# Patient Record
Sex: Female | Born: 1935 | Race: White | Hispanic: No | Marital: Married | Smoking: Never smoker
Health system: Southern US, Community
[De-identification: ages and names within clinical notes are randomized; demographics above are authoritative.]

## PROBLEM LIST (undated history)

## (undated) DIAGNOSIS — R2681 Unsteadiness on feet: Secondary | ICD-10-CM

## (undated) DIAGNOSIS — M549 Dorsalgia, unspecified: Secondary | ICD-10-CM

## (undated) DIAGNOSIS — J449 Chronic obstructive pulmonary disease, unspecified: Secondary | ICD-10-CM

## (undated) DIAGNOSIS — R251 Tremor, unspecified: Secondary | ICD-10-CM

## (undated) DIAGNOSIS — M542 Cervicalgia: Secondary | ICD-10-CM

## (undated) DIAGNOSIS — I10 Essential (primary) hypertension: Secondary | ICD-10-CM

## (undated) DIAGNOSIS — H409 Unspecified glaucoma: Secondary | ICD-10-CM

## (undated) DIAGNOSIS — G20A1 Parkinson's disease without dyskinesia, without mention of fluctuations: Secondary | ICD-10-CM

## (undated) DIAGNOSIS — M1990 Unspecified osteoarthritis, unspecified site: Secondary | ICD-10-CM

## (undated) DIAGNOSIS — F0390 Unspecified dementia without behavioral disturbance: Secondary | ICD-10-CM

## (undated) DIAGNOSIS — G20 Parkinson's disease: Secondary | ICD-10-CM

## (undated) HISTORY — PX: TUBAL LIGATION: SHX77

## (undated) HISTORY — DX: Chronic obstructive pulmonary disease, unspecified: J44.9

## (undated) HISTORY — PX: EYE SURGERY: SHX253

## (undated) HISTORY — PX: CATARACT EXTRACTION: SUR2

## (undated) HISTORY — DX: Parkinson's disease without dyskinesia, without mention of fluctuations: G20.A1

## (undated) HISTORY — DX: Parkinson's disease: G20

## (undated) HISTORY — PX: TONSILLECTOMY: SUR1361

---

## 2014-08-18 ENCOUNTER — Emergency Department (HOSPITAL_COMMUNITY): Payer: Medicare HMO

## 2014-08-18 ENCOUNTER — Encounter (HOSPITAL_COMMUNITY): Payer: Self-pay | Admitting: Emergency Medicine

## 2014-08-18 ENCOUNTER — Emergency Department (HOSPITAL_COMMUNITY)
Admission: EM | Admit: 2014-08-18 | Discharge: 2014-08-18 | Disposition: A | Discharge status: Home / Self Care | Payer: Medicare HMO | Attending: Emergency Medicine | Admitting: Emergency Medicine

## 2014-08-18 DIAGNOSIS — Z79899 Other long term (current) drug therapy: Secondary | ICD-10-CM | POA: Insufficient documentation

## 2014-08-18 DIAGNOSIS — R251 Tremor, unspecified: Secondary | ICD-10-CM | POA: Insufficient documentation

## 2014-08-18 DIAGNOSIS — Z7982 Long term (current) use of aspirin: Secondary | ICD-10-CM | POA: Diagnosis not present

## 2014-08-18 DIAGNOSIS — R0602 Shortness of breath: Secondary | ICD-10-CM | POA: Insufficient documentation

## 2014-08-18 DIAGNOSIS — I10 Essential (primary) hypertension: Secondary | ICD-10-CM | POA: Insufficient documentation

## 2014-08-18 DIAGNOSIS — M542 Cervicalgia: Secondary | ICD-10-CM | POA: Insufficient documentation

## 2014-08-18 HISTORY — DX: Essential (primary) hypertension: I10

## 2014-08-18 LAB — COMPREHENSIVE METABOLIC PANEL
ALBUMIN: 4 g/dL (ref 3.5–5.0)
ALT: 20 U/L (ref 14–54)
AST: 31 U/L (ref 15–41)
Alkaline Phosphatase: 57 U/L (ref 38–126)
Anion gap: 8 (ref 5–15)
BILIRUBIN TOTAL: 1.1 mg/dL (ref 0.3–1.2)
BUN: 14 mg/dL (ref 6–20)
CHLORIDE: 97 mmol/L — AB (ref 101–111)
CO2: 26 mmol/L (ref 22–32)
Calcium: 9.1 mg/dL (ref 8.9–10.3)
Creatinine, Ser: 0.74 mg/dL (ref 0.44–1.00)
GFR calc Af Amer: >60 mL/min (ref >60)
Glucose, Bld: 96 mg/dL (ref 70–99)
Potassium: 4.5 mmol/L (ref 3.5–5.1)
SODIUM: 131 mmol/L — AB (ref 135–145)
Total Protein: 7.7 g/dL (ref 6.5–8.1)

## 2014-08-18 LAB — CBC WITH DIFFERENTIAL/PLATELET
BASOS PCT: 1 % (ref 0–1)
Basophils Absolute: 0.1 10*3/uL (ref 0.0–0.1)
EOS ABS: 0.5 10*3/uL (ref 0.0–0.7)
Eosinophils Relative: 6 % — ABNORMAL HIGH (ref 0–5)
HEMATOCRIT: 39.5 % (ref 36.0–46.0)
Hemoglobin: 13.2 g/dL (ref 12.0–15.0)
LYMPHS ABS: 2.4 10*3/uL (ref 0.7–4.0)
Lymphocytes Relative: 25 % (ref 12–46)
MCH: 32 pg (ref 26.0–34.0)
MCHC: 33.4 g/dL (ref 30.0–36.0)
MCV: 95.6 fL (ref 78.0–100.0)
MONOS PCT: 13 % — AB (ref 3–12)
Monocytes Absolute: 1.2 10*3/uL — ABNORMAL HIGH (ref 0.1–1.0)
NEUTROS PCT: 55 % (ref 43–77)
Neutro Abs: 5.2 10*3/uL (ref 1.7–7.7)
Platelets: 259 10*3/uL (ref 150–400)
RBC: 4.13 MIL/uL (ref 3.87–5.11)
RDW: 12.6 % (ref 11.5–15.5)
WBC: 9.4 10*3/uL (ref 4.0–10.5)

## 2014-08-18 LAB — URINE MICROSCOPIC-ADD ON

## 2014-08-18 LAB — URINALYSIS, ROUTINE W REFLEX MICROSCOPIC
Bilirubin Urine: NEGATIVE
GLUCOSE, UA: NEGATIVE mg/dL
Ketones, ur: NEGATIVE mg/dL
Leukocytes, UA: NEGATIVE
Nitrite: NEGATIVE
PH: 6.5 (ref 5.0–8.0)
Protein, ur: NEGATIVE mg/dL
Specific Gravity, Urine: <1.005 — ABNORMAL LOW (ref 1.005–1.030)
Urobilinogen, UA: 0.2 mg/dL (ref 0.0–1.0)

## 2014-08-18 LAB — BRAIN NATRIURETIC PEPTIDE: B NATRIURETIC PEPTIDE 5: 104 pg/mL — AB (ref 0.0–100.0)

## 2014-08-18 LAB — TROPONIN I: Troponin I: <0.03 ng/mL (ref <0.031)

## 2014-08-18 NOTE — ED Notes (Signed)
Family reports pt has had difficulty controlling her hypertension. Recent medication changes.

## 2014-08-18 NOTE — ED Provider Notes (Signed)
CSN: [id number]     Arrival date & time 08/18/14  1103 History  This chart was scribed for Gilda Creasehristopher J Nastasia Kage, MD by Elon SpannerGarrett Cook, ED Scribe. This patient was seen in room APA07/APA07 and the patient's care was started at 11:13 AM.   Chief Complaint  Patient presents with  . Hypertension    The history is provided by the patient. No language interpreter was used.   HPI Comments: Sandra Davis is a 81 y.o. female with a history of HTN who presents to the Emergency Department complaining of of HTN onset 6 days ago. She also notes a current headache and neck pain as well as mild, intermittent SOB.   She is prescribed and compliant with her BP medication at home.  Patient denies CP.     Past Medical History  Diagnosis Date  . Hypertension    Past Surgical History  Procedure Laterality Date  . Eye surgery     Family History  Problem Relation Age of Onset  . Heart failure Mother    History  Substance Use Topics  . Smoking status: Never Smoker   . Smokeless tobacco: Never Used  . Alcohol Use: No   OB History    Gravida Para Term Preterm AB TAB SAB Ectopic Multiple Living   5 5 5             Review of Systems  Respiratory: Positive for shortness of breath.   Musculoskeletal: Positive for neck pain.  Neurological: Positive for headaches.  All other systems reviewed and are negative.     Allergies  Other  Home Medications   Prior to Admission medications   Medication Sig Start Date End Date Taking? Authorizing Provider  alendronate (FOSAMAX) 70 MG tablet Take 1 tablet by mouth once a week. Takes on Thursday. 05/11/14  Yes Historical Provider, MD  amLODipine (NORVASC) 5 MG tablet Take 1 tablet by mouth daily. 06/27/14  Yes Historical Provider, MD  aspirin EC 81 MG tablet Take 81 mg by mouth daily.   Yes Historical Provider, MD  losartan (COZAAR) 100 MG tablet Take 1 tablet by mouth daily. 08/16/14  Yes Historical Provider, MD  metoprolol succinate (TOPROL-XL) 25 MG 24 hr  tablet Take 12.5 mg by mouth daily.  07/16/14  Yes Historical Provider, MD  timolol (BETIMOL) 0.5 % ophthalmic solution Place 1 drop into both eyes 2 (two) times daily.   Yes Historical Provider, MD   BP 124/62 mmHg  Pulse 63  Temp(Src) 98.1 F (36.7 C) (Oral)  Resp 17  Ht 5' (1.524 m)  Wt 104 lb (47.174 kg)  BMI 20.31 kg/m2  SpO2 100% Physical Exam  Constitutional: She is oriented to person, place, and time. She appears well-developed and well-nourished. No distress.  HENT:  Head: Normocephalic and atraumatic.  Eyes: Conjunctivae and EOM are normal.  Neck: Neck supple. No tracheal deviation present.  Cardiovascular: Normal rate.   Pulmonary/Chest: Effort normal. No respiratory distress.  Musculoskeletal: Normal range of motion.  Neurological: She is alert and oriented to person, place, and time.  Resting tremor of both hands.   Skin: Skin is warm and dry.  Psychiatric: She has a normal mood and affect. Her behavior is normal.  Nursing note and vitals reviewed.   ED Course  Procedures (including critical care time)  DIAGNOSTIC STUDIES: Oxygen Saturation is 99% on RA, normal by my interpretation.    COORDINATION OF CARE:  11:22 AM Discussed treatment plan with patient at bedside.  Patient acknowledges and agrees  with plan.    Labs Review Labs Reviewed  CBC WITH DIFFERENTIAL/PLATELET - Abnormal; Notable for the following:    Monocytes Relative 13 (*)    Monocytes Absolute 1.2 (*)    Eosinophils Relative 6 (*)    All other components within normal limits  COMPREHENSIVE METABOLIC PANEL - Abnormal; Notable for the following:    Sodium 131 (*)    Chloride 97 (*)    All other components within normal limits  BRAIN NATRIURETIC PEPTIDE - Abnormal; Notable for the following:    B Natriuretic Peptide 104.0 (*)    All other components within normal limits  URINALYSIS, ROUTINE W REFLEX MICROSCOPIC - Abnormal; Notable for the following:    Specific Gravity, Urine <1.005 (*)     Hgb urine dipstick TRACE (*)    All other components within normal limits  TROPONIN I  URINE MICROSCOPIC-ADD ON    Imaging Review No results found.   EKG Interpretation   Date/Time:  Saturday Aug 18 2014 11:34:33 EDT Ventricular Rate:  62 PR Interval:  237 QRS Duration: 84 QT Interval:  404 QTC Calculation: 410 R Axis:   9 Text Interpretation:  Sinus rhythm Abnormal R-wave progression, early  transition Otherwise within normal limits Confirmed by Crayton Savarese  MD,  Antjuan Rothe [phone]([id number]) on 08/18/2014 12:30:28 PM      MDM   Final diagnoses:  Shortness of breath  Essential hypertension    Patient presented to the ER for evaluation of elevated blood pressure. Family noted pressure today. She will address some mild shortness of breath. Examination, however was unremarkable. Blood pressure was only moderately elevated, has improved without intervention. Workup has been entirely unremarkable. This included cardiac evaluation. EKG was normal, no evidence of ischemia or congestive heart failure. Patient was discharged, we'll continue to monitor her blood pressure, no change her medications.  I personally performed the services described in this documentation, which was scribed in my presence. The recorded information has been reviewed and is accurate.    Gilda Creasehristopher J Hawke Villalpando, MD 08/23/14 [phone]

## 2014-08-18 NOTE — Discharge Instructions (Signed)

## 2014-09-28 ENCOUNTER — Encounter (HOSPITAL_COMMUNITY): Payer: Self-pay | Admitting: Emergency Medicine

## 2014-09-28 ENCOUNTER — Emergency Department (HOSPITAL_COMMUNITY)
Admission: EM | Admit: 2014-09-28 | Discharge: 2014-09-28 | Discharge status: Against Medical Advice | Payer: Medicare HMO | Attending: Emergency Medicine | Admitting: Emergency Medicine

## 2014-09-28 DIAGNOSIS — I10 Essential (primary) hypertension: Secondary | ICD-10-CM | POA: Insufficient documentation

## 2014-09-28 HISTORY — DX: Unspecified osteoarthritis, unspecified site: M19.90

## 2014-09-28 HISTORY — DX: Unspecified glaucoma: H40.9

## 2014-09-28 HISTORY — DX: Dorsalgia, unspecified: M54.9

## 2014-09-28 NOTE — ED Notes (Addendum)
Patient c/o hypertension. Per husband patient has struggled with high blood pressure for months. Patient seen by [facility] clinician yesterday after blood pressure was 196/104. Patient was approved to take extra dose of atenolol in which she took today and blood pressure was still 186/88 per patient. Patient reports slight pain in neck. Denies any headache or neurological deficits.

## 2014-11-20 ENCOUNTER — Emergency Department (HOSPITAL_COMMUNITY)
Admission: EM | Admit: 2014-11-20 | Discharge: 2014-11-20 | Disposition: A | Discharge status: Home / Self Care | Payer: Medicare HMO | Attending: Emergency Medicine | Admitting: Emergency Medicine

## 2014-11-20 ENCOUNTER — Encounter (HOSPITAL_COMMUNITY): Payer: Self-pay | Admitting: *Deleted

## 2014-11-20 ENCOUNTER — Emergency Department (HOSPITAL_COMMUNITY): Payer: Medicare HMO

## 2014-11-20 DIAGNOSIS — E871 Hypo-osmolality and hyponatremia: Secondary | ICD-10-CM | POA: Diagnosis not present

## 2014-11-20 DIAGNOSIS — R251 Tremor, unspecified: Secondary | ICD-10-CM | POA: Insufficient documentation

## 2014-11-20 DIAGNOSIS — I10 Essential (primary) hypertension: Secondary | ICD-10-CM | POA: Insufficient documentation

## 2014-11-20 DIAGNOSIS — Z79899 Other long term (current) drug therapy: Secondary | ICD-10-CM | POA: Insufficient documentation

## 2014-11-20 DIAGNOSIS — M1990 Unspecified osteoarthritis, unspecified site: Secondary | ICD-10-CM | POA: Diagnosis not present

## 2014-11-20 DIAGNOSIS — Z8669 Personal history of other diseases of the nervous system and sense organs: Secondary | ICD-10-CM | POA: Insufficient documentation

## 2014-11-20 DIAGNOSIS — M542 Cervicalgia: Secondary | ICD-10-CM | POA: Insufficient documentation

## 2014-11-20 DIAGNOSIS — Z7982 Long term (current) use of aspirin: Secondary | ICD-10-CM | POA: Insufficient documentation

## 2014-11-20 LAB — CBC WITH DIFFERENTIAL/PLATELET
BASOS ABS: 0.1 10*3/uL (ref 0.0–0.1)
BASOS PCT: 1 % (ref 0–1)
Eosinophils Absolute: 0.5 10*3/uL (ref 0.0–0.7)
Eosinophils Relative: 5 % (ref 0–5)
HEMATOCRIT: 39.2 % (ref 36.0–46.0)
Hemoglobin: 13.3 g/dL (ref 12.0–15.0)
Lymphocytes Relative: 27 % (ref 12–46)
Lymphs Abs: 2.6 10*3/uL (ref 0.7–4.0)
MCH: 31.9 pg (ref 26.0–34.0)
MCHC: 33.9 g/dL (ref 30.0–36.0)
MCV: 94 fL (ref 78.0–100.0)
Monocytes Absolute: 1.3 10*3/uL — ABNORMAL HIGH (ref 0.1–1.0)
Monocytes Relative: 13 % — ABNORMAL HIGH (ref 3–12)
NEUTROS ABS: 5.2 10*3/uL (ref 1.7–7.7)
Neutrophils Relative %: 54 % (ref 43–77)
Platelets: 283 10*3/uL (ref 150–400)
RBC: 4.17 MIL/uL (ref 3.87–5.11)
RDW: 12.2 % (ref 11.5–15.5)
WBC: 9.7 10*3/uL (ref 4.0–10.5)

## 2014-11-20 LAB — URINE MICROSCOPIC-ADD ON

## 2014-11-20 LAB — URINALYSIS, ROUTINE W REFLEX MICROSCOPIC
Bilirubin Urine: NEGATIVE
GLUCOSE, UA: NEGATIVE mg/dL
KETONES UR: NEGATIVE mg/dL
Leukocytes, UA: NEGATIVE
Nitrite: NEGATIVE
Protein, ur: NEGATIVE mg/dL
Specific Gravity, Urine: 1.01 (ref 1.005–1.030)
Urobilinogen, UA: 0.2 mg/dL (ref 0.0–1.0)
pH: 7 (ref 5.0–8.0)

## 2014-11-20 LAB — BASIC METABOLIC PANEL
Anion gap: 8 (ref 5–15)
BUN: 14 mg/dL (ref 6–20)
CO2: 26 mmol/L (ref 22–32)
CREATININE: 0.66 mg/dL (ref 0.44–1.00)
Calcium: 8.8 mg/dL — ABNORMAL LOW (ref 8.9–10.3)
Chloride: 94 mmol/L — ABNORMAL LOW (ref 101–111)
GLUCOSE: 94 mg/dL (ref 65–99)
Potassium: 4.1 mmol/L (ref 3.5–5.1)
Sodium: 128 mmol/L — ABNORMAL LOW (ref 135–145)

## 2014-11-20 NOTE — ED Notes (Signed)
Patient assisted to use bed pan. Husband at bedside.

## 2014-11-20 NOTE — ED Notes (Signed)
MD at bedside. 

## 2014-11-20 NOTE — ED Notes (Addendum)
Pt states she has been feeling weak today and has been having pain in her lower neck/upper back. Pt states her blood pressure has been high today as well. Pt states she has had chills and has been shaking more than normal today.

## 2014-11-20 NOTE — ED Provider Notes (Signed)
CSN: [id number]     Arrival date & time 11/20/14  1959 History  This chart was scribed for Linwood Dibbles, MD by Budd Palmer, ED Scribe. This patient was seen in room APA06/APA06 and the patient's care was started at 9:10 PM.    Chief Complaint  Patient presents with  . Hypertension   Patient is a 81 y.o. female presenting with hypertension. The history is provided by the patient and a relative. No language interpreter was used.  Hypertension This is a recurrent problem. The current episode started 6 to 12 hours ago. The problem has been gradually improving. Pertinent negatives include no chest pain and no shortness of breath. The symptoms are relieved by medications.   HPI Comments: Sandra Davis is a 81 y.o. female with a PMHx of HTN who presents to the Emergency Department complaining of hypertension onset earlier today. She reports associated back pain.   Per relative her BP was very high (192/90-something). She has been seen by the Urgent Care clinic earlier today because she was shaking and weak. Her BP at the Urgent Care clinic was 202/100-something. She was on 50mg  of timolol, but her cardiologist reduced her to 25mg , which she took this morning and again this afternoon. She received 50 mg at Urgent Care today. She is also on losartan, which she has taken today. She states she is feeling better now. Per her relative she has not had nausea, vomiting, difficulty urinating, cough, fever, chills, CP, and SOB.  Past Medical History  Diagnosis Date  . Hypertension   . Glaucoma   . Back pain   . Arthritis    Past Surgical History  Procedure Laterality Date  . Eye surgery     Family History  Problem Relation Age of Onset  . Heart failure Mother    History  Substance Use Topics  . Smoking status: Never Smoker   . Smokeless tobacco: Never Used  . Alcohol Use: No   OB History    Gravida Para Term Preterm AB TAB SAB Ectopic Multiple Living   5 5 5             Review of Systems   Constitutional: Negative for fever and chills.  Respiratory: Negative for cough and shortness of breath.   Cardiovascular: Negative for chest pain.  Gastrointestinal: Negative for nausea and vomiting.  Genitourinary: Negative for difficulty urinating.  All other systems reviewed and are negative.   Allergies  Other  Home Medications   Prior to Admission medications   Medication Sig Start Date End Date Taking? Authorizing Provider  alendronate (FOSAMAX) 70 MG tablet Take 1 tablet by mouth once a week. Takes on Thursday. 05/11/14  Yes Historical Provider, MD  amLODipine (NORVASC) 5 MG tablet Take 5 mg by mouth daily.  06/27/14  Yes Historical Provider, MD  aspirin EC 325 MG tablet Take 325 mg by mouth daily.   Yes Historical Provider, MD  atenolol (TENORMIN) 25 MG tablet Take 25 mg by mouth 2 (two) times daily.   Yes Historical Provider, MD  Cholecalciferol (VITAMIN D3) 5000 UNITS CAPS Take 1 capsule by mouth daily.   Yes Historical Provider, MD  Glucosamine-MSM-Hyaluronic Acd (JOINT HEALTH PO) Take 1 capsule by mouth 2 (two) times daily. TRI-MOTION JOINT HEALTH   Yes Historical Provider, MD  Multiple Vitamins-Minerals (HAIR/SKIN/NAILS PO) Take 1 tablet by mouth daily.   Yes Historical Provider, MD  Omega-3 Fatty Acids (FISH OIL PO) Take 1 capsule by mouth daily.   Yes Historical Provider, MD  Specialty Vitamins Products (ECHINACEA C COMPLETE PO) Take 1 capsule by mouth daily.   Yes Historical Provider, MD  timolol (BETIMOL) 0.5 % ophthalmic solution Place 1 drop into both eyes 2 (two) times daily.   Yes Historical Provider, MD  valsartan (DIOVAN) 320 MG tablet Take 320 mg by mouth daily.   Yes Historical Provider, MD  vitamin C (ASCORBIC ACID) 500 MG tablet Take 500 mg by mouth daily.   Yes Historical Provider, MD  VITAMIN E PO Take 1 tablet by mouth daily.   Yes Historical Provider, MD   BP 144/81 mmHg  Pulse 55  Temp(Src) 98.2 F (36.8 C) (Oral)  Resp 19  Ht 5' (1.524 m)  Wt 100  lb 3.2 oz (45.45 kg)  BMI 19.57 kg/m2  SpO2 96% Physical Exam  Constitutional: No distress.  Elderly, frail  HENT:  Head: Normocephalic and atraumatic.  Right Ear: External ear normal.  Left Ear: External ear normal.  Eyes: Conjunctivae are normal. Right eye exhibits no discharge. Left eye exhibits no discharge. No scleral icterus.  Neck: Neck supple. No tracheal deviation present.  Cardiovascular: Normal rate, regular rhythm and intact distal pulses.   Pulmonary/Chest: Effort normal and breath sounds normal. No stridor. No respiratory distress. She has no wheezes. She has no rales.  Abdominal: Soft. Bowel sounds are normal. She exhibits no distension. There is no tenderness. There is no rebound and no guarding.  Musculoskeletal: She exhibits no edema.       Cervical back: She exhibits tenderness. She exhibits no bony tenderness.  Neurological: She is alert. She has normal strength. She displays tremor. No cranial nerve deficit (no facial droop, extraocular movements intact, no slurred speech) or sensory deficit. She exhibits normal muscle tone. She displays no seizure activity. Coordination normal.  Skin: Skin is warm and dry. No rash noted.  Psychiatric: She has a normal mood and affect.  Nursing note and vitals reviewed.   ED Course  Procedures  DIAGNOSTIC STUDIES: Oxygen Saturation is 98% on RA, normal by my interpretation.    COORDINATION OF CARE: 9:18 PM - Discussed plans to order diagnostic studies. Advised to follow up with cardiologist/PCP. Pt advised of plan for treatment and pt agrees.  Labs Review Labs Reviewed  URINALYSIS, ROUTINE W REFLEX MICROSCOPIC (NOT AT [facility]) - Abnormal; Notable for the following:    Color, Urine STRAW (*)    Hgb urine dipstick TRACE (*)    All other components within normal limits  CBC WITH DIFFERENTIAL/PLATELET - Abnormal; Notable for the following:    Monocytes Relative 13 (*)    Monocytes Absolute 1.3 (*)    All other components within  normal limits  BASIC METABOLIC PANEL - Abnormal; Notable for the following:    Sodium 128 (*)    Chloride 94 (*)    Calcium 8.8 (*)    All other components within normal limits  URINE MICROSCOPIC-ADD ON - Abnormal; Notable for the following:    Squamous Epithelial / LPF FEW (*)    Bacteria, UA FEW (*)    All other components within normal limits    Imaging Review Dg Chest 2 View  11/20/2014   CLINICAL DATA:  Hypertension, chills.  EXAM: CHEST  2 VIEW  COMPARISON:  Aug 18, 2014.  FINDINGS: Stable compression deformity of T12 vertebral body is noted consistent with old fracture. No pneumothorax or significant pleural effusion is noted. Stable cardiomediastinal silhouette. No acute pulmonary disease is noted.  IMPRESSION: No active cardiopulmonary disease.   Electronically  Signed   By: Lupita Raider, M.D.   On: 11/20/2014 21:50     EKG Interpretation   Date/Time:  Tuesday November 20 2014 21:25:27 EDT Ventricular Rate:  56 PR Interval:  170 QRS Duration: 99 QT Interval:  450 QTC Calculation: 434 R Axis:   16 Text Interpretation:  Sinus rhythm Low voltage, precordial leads  Anteroseptal infarct, old No significant change since last tracing  Confirmed by Alvah Gilder  MD-J, Aaron Bostwick ([id number]) on 11/20/2014 10:49:52 PM      MDM   Final diagnoses:  Essential hypertension  Hyponatremia   The patient's blood pressure improved without any additional medications. Laboratory tests are reassuring. She does have hyponatremia but this is not significantly changed from previous values.  At this time there does not appear to be any evidence of an acute emergency medical condition and the patient appears stable for discharge with appropriate outpatient follow up.   I personally performed the services described in this documentation, which was scribed in my presence.  The recorded information has been reviewed and is accurate.   Linwood Dibbles, MD 11/20/14 [phone]

## 2014-11-20 NOTE — Discharge Instructions (Signed)

## 2014-11-30 ENCOUNTER — Emergency Department (HOSPITAL_COMMUNITY): Payer: Medicare HMO

## 2014-11-30 ENCOUNTER — Emergency Department (HOSPITAL_COMMUNITY)
Admission: EM | Admit: 2014-11-30 | Discharge: 2014-11-30 | Disposition: A | Discharge status: Home / Self Care | Payer: Medicare HMO | Attending: Emergency Medicine | Admitting: Emergency Medicine

## 2014-11-30 ENCOUNTER — Encounter (HOSPITAL_COMMUNITY): Payer: Self-pay | Admitting: Emergency Medicine

## 2014-11-30 DIAGNOSIS — I10 Essential (primary) hypertension: Secondary | ICD-10-CM | POA: Diagnosis not present

## 2014-11-30 DIAGNOSIS — Y92810 Car as the place of occurrence of the external cause: Secondary | ICD-10-CM | POA: Diagnosis not present

## 2014-11-30 DIAGNOSIS — Z8669 Personal history of other diseases of the nervous system and sense organs: Secondary | ICD-10-CM | POA: Diagnosis not present

## 2014-11-30 DIAGNOSIS — S8012XA Contusion of left lower leg, initial encounter: Secondary | ICD-10-CM

## 2014-11-30 DIAGNOSIS — Y998 Other external cause status: Secondary | ICD-10-CM | POA: Diagnosis not present

## 2014-11-30 DIAGNOSIS — Z7982 Long term (current) use of aspirin: Secondary | ICD-10-CM | POA: Insufficient documentation

## 2014-11-30 DIAGNOSIS — Z79899 Other long term (current) drug therapy: Secondary | ICD-10-CM | POA: Insufficient documentation

## 2014-11-30 DIAGNOSIS — W228XXA Striking against or struck by other objects, initial encounter: Secondary | ICD-10-CM | POA: Insufficient documentation

## 2014-11-30 DIAGNOSIS — M1990 Unspecified osteoarthritis, unspecified site: Secondary | ICD-10-CM | POA: Diagnosis not present

## 2014-11-30 DIAGNOSIS — Y9389 Activity, other specified: Secondary | ICD-10-CM | POA: Insufficient documentation

## 2014-11-30 DIAGNOSIS — S8992XA Unspecified injury of left lower leg, initial encounter: Secondary | ICD-10-CM | POA: Diagnosis present

## 2014-11-30 DIAGNOSIS — T148XXA Other injury of unspecified body region, initial encounter: Secondary | ICD-10-CM

## 2014-11-30 NOTE — ED Notes (Signed)
Pt reports fell going into her home Saturday. Pt reports left leg pain ever since. Moderate contusion and swelling noted to LLE. Pt unsure if loc or hit head. Pt reports "my body moved so fast."

## 2014-11-30 NOTE — ED Provider Notes (Signed)
CSN: [id number]     Arrival date & time 11/30/14  1105 History  This chart was scribe for Glynn Octave, MD by Angelene Giovanni, ED Scribe. The patient was seen in room APA05/APA05 and the patient's care was started at 11:31 AM.     Chief Complaint  Patient presents with  . Fall   The history is provided by the patient. No language interpreter was used.   HPI Comments: Sandra Davis is a 81 y.o. female who presents to the Emergency Department status post injury that occurred 1 week ago as she was trying to get into the car. Family member reports that she hit her left leg unto the edge of the car as she was trying to get in. She denies falling to the ground or hitting her head. She reports associated hematoma on her left leg. She reports that she is currently on Aspirin. She denies any back pain or any gait problems. She adds that she always uses her walking cane. She was seen at the clinic 4 days ago and had an x-ray with no broken bones. She reports that she is here because the swelling has gotten worse.   Past Medical History  Diagnosis Date  . Hypertension   . Glaucoma   . Back pain   . Arthritis    Past Surgical History  Procedure Laterality Date  . Eye surgery     Family History  Problem Relation Age of Onset  . Heart failure Mother    Social History  Substance Use Topics  . Smoking status: Never Smoker   . Smokeless tobacco: Never Used  . Alcohol Use: No   OB History    Gravida Para Term Preterm AB TAB SAB Ectopic Multiple Living   Review of Systems  Constitutional: Negative for fever and chills.  Cardiovascular: Negative for chest pain.  Gastrointestinal: Negative for abdominal pain.  Musculoskeletal: Positive for joint swelling. Negative for back pain and gait problem.  Skin: Positive for wound.  All other systems reviewed and are negative.     Allergies  Other  Home Medications   Prior to Admission medications   Medication Sig Start  Date End Date Taking? Authorizing Provider  alendronate (FOSAMAX) 70 MG tablet Take 1 tablet by mouth once a week. Takes on Thursday. 05/11/14  Yes Historical Provider, MD  amLODipine (NORVASC) 5 MG tablet Take 5 mg by mouth daily.  06/27/14  Yes Historical Provider, MD  aspirin EC 325 MG tablet Take 325 mg by mouth daily.   Yes Historical Provider, MD  atenolol (TENORMIN) 25 MG tablet Take 50 mg by mouth 2 (two) times daily.    Yes Historical Provider, MD  Cholecalciferol (VITAMIN D3) 5000 UNITS CAPS Take 1 capsule by mouth daily.   Yes Historical Provider, MD  Glucosamine-MSM-Hyaluronic Acd (JOINT HEALTH PO) Take 1 capsule by mouth 2 (two) times daily. TRI-MOTION JOINT HEALTH   Yes Historical Provider, MD  Multiple Vitamins-Minerals (HAIR/SKIN/NAILS PO) Take 1 tablet by mouth daily.   Yes Historical Provider, MD  Omega-3 Fatty Acids (FISH OIL PO) Take 1 capsule by mouth daily.   Yes Historical Provider, MD  omeprazole (PRILOSEC) 20 MG capsule Take 20 mg by mouth daily.   Yes Historical Provider, MD  Specialty Vitamins Products (ECHINACEA C COMPLETE PO) Take 1 capsule by mouth daily.   Yes Historical Provider, MD  timolol (BETIMOL) 0.5 % ophthalmic solution Place 1 drop  into both eyes 2 (two) times daily.   Yes Historical Provider, MD  valsartan (DIOVAN) 320 MG tablet Take 320 mg by mouth daily.   Yes Historical Provider, MD  vitamin C (ASCORBIC ACID) 500 MG tablet Take 500 mg by mouth daily.   Yes Historical Provider, MD  VITAMIN E PO Take 1 tablet by mouth daily.   Yes Historical Provider, MD   BP 180/78 mmHg  Pulse 60  Temp(Src) 98 F (36.7 C) (Oral)  Resp 18  Ht 5' (1.524 m)  Wt 99 lb (44.906 kg)  BMI 19.33 kg/m2  SpO2 98% Physical Exam  Constitutional: She is oriented to person, place, and time. She appears well-developed and well-nourished. No distress.  HENT:  Head: Normocephalic and atraumatic.  Mouth/Throat: Oropharynx is clear and moist. No oropharyngeal exudate.  Eyes:  Conjunctivae and EOM are normal. Pupils are equal, round, and reactive to light.  Neck: Normal range of motion. Neck supple.  No meningismus.  Cardiovascular: Normal rate, regular rhythm, normal heart sounds and intact distal pulses.   No murmur heard. Pulmonary/Chest: Effort normal and breath sounds normal. No respiratory distress.  Abdominal: Soft. There is no tenderness. There is no rebound and no guarding.  Musculoskeletal: Normal range of motion. She exhibits edema and tenderness.  Intact DP.PT pulses.  Compartments soft  Neurological: She is alert and oriented to person, place, and time. No cranial nerve deficit. She exhibits normal muscle tone. Coordination normal.  No ataxia on finger to nose bilaterally. No pronator drift. 5/5 strength throughout. CN 2-12 intact. Equal grip strength. Sensation intact.   Skin: Skin is warm. There is erythema.  3 cm tender Hematoma and ecchymosis to L lateral lower leg. Diffuse mild swelling of ankle and dorsal foot ecchymosis to bases of 2nd and 3rd toes  Psychiatric: She has a normal mood and affect. Her behavior is normal.  Nursing note and vitals reviewed.   ED Course  Procedures (including critical care time) DIAGNOSTIC STUDIES: Oxygen Saturation is 99% on RA, normal by my interpretation.    COORDINATION OF CARE: 11:40 AM- Pt advised of plan for treatment and pt agrees.    Labs Review Labs Reviewed - No data to display  Imaging Review Dg Tibia/fibula Left  11/30/2014   CLINICAL DATA:  Pain and bruising since Saturday morning.  EXAM: LEFT FOOT - COMPLETE 3+ VIEW; LEFT ANKLE COMPLETE - 3+ VIEW; LEFT TIBIA AND FIBULA - 2 VIEW  COMPARISON:  None.  FINDINGS: Left foot:  The joint spaces are maintained. No acute fracture is identified. Mild pes cavus deformity. Small calcaneal spurs.  Left ankle:  The ankle mortise is maintained. No acute ankle fracture. No osteochondral abnormality. No joint effusion. Subcutaneous soft tissue swelling/ edema  is noted.  Left tibia/fibula:  The knee and ankle joints are maintained. No acute bony abnormality involving the tibia or fibula. Subcutaneous soft tissue swelling/ edema is noted with a probable hematoma in the lower anterior shin region.  IMPRESSION: No acute bony findings.  Subcutaneous soft tissue swelling/ edema and possible focal hematoma in the lower anterior shin region.   Electronically Signed   By: Rudie Meyer M.D.   On: 11/30/2014 12:52   Dg Ankle Complete Left  11/30/2014   CLINICAL DATA:  Pain and bruising since Saturday morning.  EXAM: LEFT FOOT - COMPLETE 3+ VIEW; LEFT ANKLE COMPLETE - 3+ VIEW; LEFT TIBIA AND FIBULA - 2 VIEW  COMPARISON:  None.  FINDINGS: Left foot:  The joint spaces are maintained.  No acute fracture is identified. Mild pes cavus deformity. Small calcaneal spurs.  Left ankle:  The ankle mortise is maintained. No acute ankle fracture. No osteochondral abnormality. No joint effusion. Subcutaneous soft tissue swelling/ edema is noted.  Left tibia/fibula:  The knee and ankle joints are maintained. No acute bony abnormality involving the tibia or fibula. Subcutaneous soft tissue swelling/ edema is noted with a probable hematoma in the lower anterior shin region.  IMPRESSION: No acute bony findings.  Subcutaneous soft tissue swelling/ edema and possible focal hematoma in the lower anterior shin region.   Electronically Signed   By: Rudie Meyer M.D.   On: 11/30/2014 12:52   US Venous Img Lower Unilateral Left  11/30/2014   CLINICAL DATA:  Left lower extremity pain and swelling after injury  EXAM: LEFT LOWER EXTREMITY VENOUS DUPLEX ULTRASOUND  TECHNIQUE: Doppler venous assessment of the left lower extremity deep venous system was performed, including characterization of spectral flow, compressibility, and phasicity.  COMPARISON:  None.  FINDINGS: There is complete compressibility of the left common femoral, femoral, and popliteal veins. Doppler analysis demonstrates respiratory  phasicity and augmentation of flow upon calf compression.  There is a complex fluid collection in the anterior shin at the location of the patient's injury most  IMPRESSION: No evidence of DVT.  Complex fluid collection in the anterior shin at the location of the patient's injury most consistent with a soft tissue hematoma. The finding is nonspecific.   Electronically Signed   By: Jolaine Click M.D.   On: 11/30/2014 13:22   Dg Foot Complete Left  11/30/2014   CLINICAL DATA:  Pain and bruising since Saturday morning.  EXAM: LEFT FOOT - COMPLETE 3+ VIEW; LEFT ANKLE COMPLETE - 3+ VIEW; LEFT TIBIA AND FIBULA - 2 VIEW  COMPARISON:  None.  FINDINGS: Left foot:  The joint spaces are maintained. No acute fracture is identified. Mild pes cavus deformity. Small calcaneal spurs.  Left ankle:  The ankle mortise is maintained. No acute ankle fracture. No osteochondral abnormality. No joint effusion. Subcutaneous soft tissue swelling/ edema is noted.  Left tibia/fibula:  The knee and ankle joints are maintained. No acute bony abnormality involving the tibia or fibula. Subcutaneous soft tissue swelling/ edema is noted with a probable hematoma in the lower anterior shin region.  IMPRESSION: No acute bony findings.  Subcutaneous soft tissue swelling/ edema and possible focal hematoma in the lower anterior shin region.   Electronically Signed   By: Rudie Meyer M.D.   On: 11/30/2014 12:52   I have personally reviewed and evaluated these images and lab results as part of my medical decision-making.   EKG Interpretation None      MDM   Final diagnoses:  Hematoma  Contusion of leg, left, initial encounter   Left leg pain and swelling while trying to get in the car 6 days ago. Did not fall to the ground. Did not hit head. Erythema and swelling to left leg with intact distal pulses.  X-rays negative. Doppler negative for DVT. There is a soft tissue mass compatible hematoma. No evidence of compartment syndrome.    Patient reassured. Advised elevation, anti- inflammatory's, supportive care and follow-up with PCP. Return precautions discussed.   I personally performed the services described in this documentation, which was scribed in my presence. The recorded information has been reviewed and is accurate.   Glynn Octave, MD 11/30/14 1356

## 2014-11-30 NOTE — Discharge Instructions (Signed)
Contusion Use ibuprofen as needed for pain and swelling. Keep leg elevated. Return to the ED if you develop new or worsening symptoms. A contusion is a deep bruise. Contusions are the result of an injury that caused bleeding under the skin. The contusion may turn blue, purple, or yellow. Minor injuries will give you a painless contusion, but more severe contusions may stay painful and swollen for a few weeks.  CAUSES  A contusion is usually caused by a blow, trauma, or direct force to an area of the body. SYMPTOMS   Swelling and redness of the injured area.  Bruising of the injured area.  Tenderness and soreness of the injured area.  Pain. DIAGNOSIS  The diagnosis can be made by taking a history and physical exam. An X-ray, CT scan, or MRI may be needed to determine if there were any associated injuries, such as fractures. TREATMENT  Specific treatment will depend on what area of the body was injured. In general, the best treatment for a contusion is resting, icing, elevating, and applying cold compresses to the injured area. Over-the-counter medicines may also be recommended for pain control. Ask your caregiver what the best treatment is for your contusion. HOME CARE INSTRUCTIONS   Put ice on the injured area.  Put ice in a plastic bag.  Place a towel between your skin and the bag.  Leave the ice on for 15-20 minutes, 3-4 times a day, or as directed by your health care provider.  Only take over-the-counter or prescription medicines for pain, discomfort, or fever as directed by your caregiver. Your caregiver may recommend avoiding anti-inflammatory medicines (aspirin, ibuprofen, and naproxen) for 48 hours because these medicines may increase bruising.  Rest the injured area.  If possible, elevate the injured area to reduce swelling. SEEK IMMEDIATE MEDICAL CARE IF:   You have increased bruising or swelling.  You have pain that is getting worse.  Your swelling or pain is not  relieved with medicines. MAKE SURE YOU:   Understand these instructions.  Will watch your condition.  Will get help right away if you are not doing well or get worse. Document Released: 01/07/2005 Document Revised: 04/04/2013 Document Reviewed: 02/02/2011 [facility] Patient Information 2015 [place], [place]. This information is not intended to replace advice given to you by your health care provider. Make sure you discuss any questions you have with your health care provider.

## 2015-02-11 ENCOUNTER — Encounter: Payer: Self-pay | Admitting: *Deleted

## 2015-02-12 ENCOUNTER — Encounter: Payer: Self-pay | Admitting: Diagnostic Neuroimaging

## 2015-02-12 ENCOUNTER — Ambulatory Visit (INDEPENDENT_AMBULATORY_CARE_PROVIDER_SITE_OTHER): Payer: Medicare HMO | Admitting: Diagnostic Neuroimaging

## 2015-02-12 VITALS — BP 142/70 | HR 54 | Ht 60.0 in | Wt 95.2 lb

## 2015-02-12 DIAGNOSIS — G20 Parkinson's disease: Secondary | ICD-10-CM | POA: Diagnosis not present

## 2015-02-12 MED ORDER — CARBIDOPA-LEVODOPA 25-100 MG PO TABS: 1.0000 | ORAL_TABLET | Freq: Three times a day (TID) | ORAL | Status: DC

## 2015-02-12 NOTE — Progress Notes (Signed)
[facility]  PATIENT: Sandra Davis DOB: May 19, 1928  REFERRING CLINICIAN: Burnett HISTORY FROM: patient and husband  REASON FOR VISIT: new consult    HISTORICAL  CHIEF COMPLAINT:  No chief complaint on file.   HISTORY OF PRESENT ILLNESS:   81 year old ambidextrous female with hypertension, here for evaluation of tremor. Patient reports tremor for past 1-2 months in upper extremity. Husband disagrees and states this is been going on for 1-2 years. Tremor is present mainly at rest but also with certain activities. Tremors affecting both arms. Also patient has had gradually progressive gait and balance deterioration. She's had multiple falls. Symptoms have been worsening over the past 1-2 years as well. Patient started using a cane approximately one year ago. She has a walker at home but apparently does not use it because it is stored in the basement. Patient and husband moved in to live with their son and his family in November 2015 due to more difficulties with them maintaining her activities of daily living.  Patient having some memory loss issues. Also with dizziness, incontinence and tremor.   REVIEW OF SYSTEMS: Full 14 system review of systems performed and notable only for as per history of present illness.  ALLERGIES: Allergies  Allergen Reactions  . Other Other (See Comments)    Patient states that she has taken some blood pressure medicines in the past and has experienced side effects.  Patient cannot remember the names.  States that the main reaction is dizziness.  Marland Kitchen Spironolactone Other (See Comments)    Blurred vision, sweating of feet    HOME MEDICATIONS: Outpatient Prescriptions Prior to Visit  Medication Sig Dispense Refill  . alendronate (FOSAMAX) 70 MG tablet Take 1 tablet by mouth once a week. Takes on Thursday.  1  . amLODipine (NORVASC) 5 MG tablet Take 5 mg by mouth daily.   1  . aspirin EC 325 MG tablet Take 325 mg by mouth daily.    Marland Kitchen  atenolol (TENORMIN) 25 MG tablet Take 50 mg by mouth 2 (two) times daily.     . Cholecalciferol (VITAMIN D3) 5000 UNITS CAPS Take 1 capsule by mouth daily.    . Glucosamine-MSM-Hyaluronic Acd (JOINT HEALTH PO) Take 1 capsule by mouth 2 (two) times daily. TRI-MOTION JOINT HEALTH    . Multiple Vitamins-Minerals (HAIR/SKIN/NAILS PO) Take 1 tablet by mouth daily.    . Omega-3 Fatty Acids (FISH OIL PO) Take 1 capsule by mouth daily.    . timolol (BETIMOL) 0.5 % ophthalmic solution Place 1 drop into both eyes 2 (two) times daily.    . valsartan (DIOVAN) 320 MG tablet Take 320 mg by mouth daily.    . vitamin C (ASCORBIC ACID) 500 MG tablet Take 500 mg by mouth daily.    Marland Kitchen VITAMIN E PO Take 1 tablet by mouth daily.    Marland Kitchen omeprazole (PRILOSEC) 20 MG capsule Take 20 mg by mouth daily.    Marland Kitchen Specialty Vitamins Products (ECHINACEA C COMPLETE PO) Take 1 capsule by mouth daily.     No facility-administered medications prior to visit.    PAST MEDICAL HISTORY: Past Medical History  Diagnosis Date  . Hypertension   . Glaucoma   . Back pain   . Arthritis     osteoporosis  . COPD (chronic obstructive pulmonary disease) (HCC)     PAST SURGICAL HISTORY: Past Surgical History  Procedure Laterality Date  . Eye surgery    . Cataract extraction    . Tonsillectomy    .  Tubal ligation      FAMILY HISTORY: Family History  Problem Relation Age of Onset  . Heart failure Mother     SOCIAL HISTORY:  Social History   Social History  . Marital Status: Married    Spouse Name: Normand Sloop  . Number of Children: 5  . Years of Education: 3   Occupational History  . Not on file.   Social History Main Topics  . Smoking status: Never Smoker   . Smokeless tobacco: Never Used  . Alcohol Use: No  . Drug Use: No  . Sexual Activity: Not on file   Other Topics Concern  . Not on file   Social History Narrative   Lives at home with husband, family   Caffeine use- tea 2 cups daily, decaf green tea       PHYSICAL EXAM  GENERAL EXAM/CONSTITUTIONAL: Vitals:  Filed Vitals:   02/12/15 1140  BP: 142/70  Pulse: 54  Height: 5' (1.524 m)  Weight: 95 lb 3.2 oz (43.182 kg)     Body mass index is 18.59 kg/(m^2).  Vision Screening Comments: 02/12/15 unable to stand for vision screen  Patient is in no distress; well developed, nourished and groomed; neck is supple  MASKED FACIES  CARDIOVASCULAR:  Examination of carotid arteries is normal; no carotid bruits  Regular rate and rhythm, no murmurs  Examination of peripheral vascular system by observation and palpation is normal  EYES:  Ophthalmoscopic exam of optic discs and posterior segments is normal; no papilledema or hemorrhages  MUSCULOSKELETAL:  Gait, strength, tone, movements noted in Neurologic exam below  NEUROLOGIC: MENTAL STATUS:  No flowsheet data found.  awake, alert, oriented to person, place and time ("February 12, 2015")  3/3 REGISTRATION; 2/3 RECALL; recent and remote memory [facility]  DECR attention and concentration  DECR FLUENCY; comprehension intact, naming intact,   fund of knowledge appropriate  CRANIAL NERVE:   2nd - no papilledema on fundoscopic exam  2nd, 3rd, 4th, 6th - pupils equal and reactive to light, visual fields full to confrontation, extraocular muscles intact, no nystagmus  5th - facial sensation symmetric  7th - facial strength symmetric  8th - hearing intact  9th - palate elevates symmetrically, uvula midline  11th - shoulder shrug symmetric  12th - tongue protrusion midline  SOFT HOARSE VOICE  MOTOR:   INTERMITTENT RESTING TREMOR IN BUE; MODERATE COGWHEELING AND BRADYKINESIA IN BUE AND BLE; DIFFUSE 4/5 STRENGTH  SENSORY:   normal and symmetric to light touch, pinprick, temperature, vibration  COORDINATION:   finger-nose-finger, fine finger movements --> SLOW  REFLEXES:   deep tendon reflexes TRACE and symmetric  GAIT/STATION:   BARELY ABLE TO STAND WITH 2  PERSON ASSIST; SHORT SHUFFLING STEPS; USES SINGLE POINT CANE; VERY UNSTEADY    DIAGNOSTIC DATA (LABS, IMAGING, TESTING) - I reviewed patient records, labs, notes, testing and imaging myself where available.  Lab Results  Component Value Date   WBC 9.7 11/20/2014   HGB 13.3 11/20/2014   HCT 39.2 11/20/2014   MCV 94.0 11/20/2014   PLT 283 11/20/2014      Component Value Date/Time   NA 128* 11/20/2014 2133   K 4.1 11/20/2014 2133   CL 94* 11/20/2014 2133   CO2 26 11/20/2014 2133   GLUCOSE 94 11/20/2014 2133   BUN 14 11/20/2014 2133   CREATININE 0.66 11/20/2014 2133   CALCIUM 8.8* 11/20/2014 2133   PROT 7.7 08/18/2014 1138   ALBUMIN 4.0 08/18/2014 1138   AST 31 08/18/2014 1138  ALT 20 08/18/2014 1138   ALKPHOS 57 08/18/2014 1138   BILITOT 1.1 08/18/2014 1138   GFRNONAA >60 11/20/2014 2133   GFRAA >60 11/20/2014 2133   No results found for: CHOL, HDL, LDLCALC, LDLDIRECT, TRIG, CHOLHDL No results found for: [id number] No results found for: VITAMINB12 No results found for: TSH     ASSESSMENT AND PLAN  81 y.o. year old female here with progressive resting tremor, bradykinesia, cogwheel rigidity, balance difficulty, masked facies, memory loss. Findings consistent with Parkinson's disease. Will check CT scan to rule out secondary causes. Advised patient to use home health therapy services, Rollator walker, and additional help from family. Will try empiric trial of carbidopa/levodopa for symptom management. Patient will return in 6 weeks for follow-up.  Dx:  Parkinson's disease (HCC) - Plan: CT Head Wo Contrast, Ambulatory referral to Home Health, For home use only DME 4 wheeled rolling walker with seat    PLAN:  Orders Placed This Encounter  Procedures  . For home use only DME 4 wheeled rolling walker with seat  . CT Head Wo Contrast  . Ambulatory referral to Home Health    Meds ordered this encounter  Medications  . carbidopa-levodopa (SINEMET IR) 25-100 MG tablet     Sig: Take 1 tablet by mouth 3 (three) times daily before meals.    Dispense:  90 tablet    Refill:  6    Return in about 6 weeks (around 03/26/2015).    Suanne MarkerVIKRAM R. PENUMALLI, MD 02/12/2015, 12:40 PM Certified in Neurology, Neurophysiology and Neuroimaging  [facility] [address], [address] [place], [place] [address] [phone]

## 2015-02-12 NOTE — Patient Instructions (Signed)
[facility] you [facility]. I hope we have been able to provide you high quality care today.  You may receive a patient satisfaction survey over the next few weeks. We would appreciate your feedback and comments so that we may continue to improve ourselves and the health of our patients.  - I will check CT head  - I will setup home health agency referral for physical therapy - use rollator walker - start carbidopa/levodopa 25/100 --> half tab three times per day before meals; after 2 weeks increase to 1 full tab three times per day   ~~~~~~~~~~~~~~~~~~~~~~~~~~~~~~~~~~~~~~~~~~~~~~~~~~~~~~~~~~~~~~~~~  DR. PENUMALLI'S GUIDE TO HAPPY AND HEALTHY LIVING These are some of my general health and wellness recommendations. Some of them may apply to you better than others. Please use common sense as you try these suggestions and feel free to ask me any questions.   ACTIVITY/FITNESS Mental, social, emotional and physical stimulation are very important for brain and body health. Try learning a new activity (arts, music, language, sports, games).  Keep moving your body to the best of your abilities. Will setup physical therapy to get started.    NUTRITION Eat more plants: colorful vegetables, nuts, seeds and berries.  Eat less sugar, salt, preservatives and processed foods.  Avoid toxins such as cigarettes and alcohol.  Drink water when you are thirsty. Warm water with a slice of lemon is an excellent morning drink to start the day.  Consider these websites for more information The Nutrition Source (https://www.garcia-mcintosh.com/https://www.hsph.harvard.edu/nutritionsource) Precision Nutrition (http://www.edwards.info/www.precisionnutrition.com/blog/infographics)    SLEEP Try to get at least 7-8+ hours sleep per day. Regular exercise and reduced caffeine will help you sleep better. Practice good sleep hygeine techniques. See website sleep.org for more information.   PLANNING Prepare estate planning,  living will, healthcare POA documents. Sometimes this is best planned with the help of an attorney. Theconversationproject.org and agingwithdignity.org are excellent resources.

## 2015-02-14 ENCOUNTER — Telehealth: Payer: Self-pay | Admitting: Diagnostic Neuroimaging

## 2015-02-14 NOTE — Telephone Encounter (Signed)
Sandra Davis/[facility] [phone] called to advise of medication change. Patient took one dose of carbidopa-levodopa (SINEMET IR) 25-100 MG tablet and became very dizzy and weak. Patient refuses to take.

## 2015-02-14 NOTE — Telephone Encounter (Signed)
Wait a few days, then try half tab dose and see if she can tolerate. -VRP

## 2015-02-14 NOTE — Telephone Encounter (Signed)
Left VM for Sandra HesselbachMaria informing her that Dr Marjory LiesPenumalli recommends patient wait a few days then try medication again, 1/2 tab with each dose to see how she tolerates. Advised a return call next week for update. Left this caller's name, number for questions, call back.

## 2015-02-21 NOTE — Telephone Encounter (Addendum)
Left VM for Byrd HesselbachMaria with [facility] requesting she call back with update on patient re: medication dosing. Left this caller's name, number and informed her this RN will be in office until 5 pm today, out of office tomorrow, but Dr Marjory LiesPenumalli is in office tomorrow.   8:38 am  Received call back from PorterMaria, [facility] who states patient's husband and daughter-in-law refused for patient to take Carbidopa-Levodopa even at reduced dose, due to her weakness, dizziness. She states husband said they may not have her CT scan done. Byrd HesselbachMaria discussed with husband and daughter-in-law that they should follow dr's advice. She states that she feels patient will have scan done. Gave her dates/ times of scan and patient's FU with Dr Marjory LiesPenumalli. Also stated this RN will move FU earlier if family requests. She verified patient is receiving PT, OT and RN home care.  8:58 am Spoke with husband and reminded him of patient's ct scan tomorrow. He verified time and stated he is taking her for scan. Will route this to Dr Marjory LiesPenumalli.

## 2015-02-21 NOTE — Telephone Encounter (Signed)
Ok to stay off carbidopa-levodopa and follow up with PCP re: weakness. -VRP

## 2015-02-21 NOTE — Telephone Encounter (Signed)
Left VM for Sandra HesselbachMaria informing her of Dr Penumalli's response. Also informed her this RN spoke to pt's husband who stated he will take her for CT scan tomorrow. Advised she does not need to return call unless she has further questions.

## 2015-02-22 ENCOUNTER — Ambulatory Visit
Admission: RE | Admit: 2015-02-22 | Discharge: 2015-02-22 | Disposition: A | Discharge status: Home / Self Care | Payer: Medicare HMO | Source: Ambulatory Visit | Attending: Diagnostic Neuroimaging | Admitting: Diagnostic Neuroimaging

## 2015-02-22 DIAGNOSIS — G20 Parkinson's disease: Secondary | ICD-10-CM

## 2015-03-19 ENCOUNTER — Encounter (INDEPENDENT_AMBULATORY_CARE_PROVIDER_SITE_OTHER): Payer: Self-pay

## 2015-03-19 ENCOUNTER — Encounter: Payer: Self-pay | Admitting: Diagnostic Neuroimaging

## 2015-03-19 ENCOUNTER — Ambulatory Visit (INDEPENDENT_AMBULATORY_CARE_PROVIDER_SITE_OTHER): Payer: Medicare HMO | Admitting: Diagnostic Neuroimaging

## 2015-03-19 VITALS — BP 172/79 | HR 64 | Ht 60.0 in | Wt 97.0 lb

## 2015-03-19 DIAGNOSIS — G20 Parkinson's disease: Secondary | ICD-10-CM

## 2015-03-19 NOTE — Patient Instructions (Signed)
[facility]. I hope we have been able to provide you high quality care today.  You may receive a patient satisfaction survey over the next few weeks. We would appreciate your feedback and comments so that we may continue to improve ourselves and the health of our patients.  - consider trying carbidopa/levodopa 1 more time in future (half with small snack) - use rollator walker - continue exercises

## 2015-03-19 NOTE — Progress Notes (Signed)
[facility]  PATIENT: Sandra Davis DOB: 1928/10/12  REFERRING CLINICIAN: Burnett HISTORY FROM: patient and husband  REASON FOR VISIT: follow up   HISTORICAL  CHIEF COMPLAINT:  Chief Complaint  Patient presents with  . Parkinson's disease    rm 7, husband Normand Sloop, 11/11 CT Head - review  . Follow-up    6 weeks    HISTORY OF PRESENT ILLNESS:   UPDATE 03/19/15: Patient doing better with PT, OT and rollator. tried 1 dose of carb/levo (half tab) and felt very weakn and dizzy, so she stopped it.   PRIOR HPI (02/12/15): 81 year old ambidextrous female with hypertension, here for evaluation of tremor. Patient reports tremor for past 1-2 months in upper extremity. Husband disagrees and states this is been going on for 1-2 years. Tremor is present mainly at rest but also with certain activities. Tremors affecting both arms. Also patient has had gradually progressive gait and balance deterioration. She's had multiple falls. Symptoms have been worsening over the past 1-2 years as well. Patient started using a cane approximately one year ago. She has a walker at home but apparently does not use it because it is stored in the basement. Patient and husband moved in to live with their son and his family in November 2015 due to more difficulties with them maintaining her activities of daily living. Patient having some memory loss issues. Also with dizziness, incontinence and tremor.   REVIEW OF SYSTEMS: Full 14 system review of systems performed and notable only for as per history of present illness.  ALLERGIES: Allergies  Allergen Reactions  . Other Other (See Comments)    Patient states that she has taken some blood pressure medicines in the past and has experienced side effects.  Patient cannot remember the names.  States that the main reaction is dizziness.  Marland Kitchen Spironolactone Other (See Comments)    Blurred vision, sweating of feet    HOME MEDICATIONS: Outpatient  Prescriptions Prior to Visit  Medication Sig Dispense Refill  . alendronate (FOSAMAX) 70 MG tablet Take 1 tablet by mouth once a week. Takes on Thursday.  1  . amLODipine (NORVASC) 5 MG tablet Take 5 mg by mouth daily.   1  . aspirin EC 325 MG tablet Take 325 mg by mouth daily.    Marland Kitchen atenolol (TENORMIN) 25 MG tablet Take 50 mg by mouth 2 (two) times daily.     . Cholecalciferol (VITAMIN D3) 5000 UNITS CAPS Take 1 capsule by mouth daily.    . Glucosamine-MSM-Hyaluronic Acd (JOINT HEALTH PO) Take 1 capsule by mouth 2 (two) times daily. TRI-MOTION JOINT HEALTH    . meloxicam (MOBIC) 7.5 MG tablet 7.5 mg as needed.  3  . Multiple Vitamins-Minerals (HAIR/SKIN/NAILS PO) Take 1 tablet by mouth daily.    . Omega-3 Fatty Acids (FISH OIL PO) Take 1 capsule by mouth daily.    Marland Kitchen omeprazole (PRILOSEC) 40 MG capsule 40 mg.    . timolol (BETIMOL) 0.5 % ophthalmic solution Place 1 drop into both eyes 2 (two) times daily.    . valsartan (DIOVAN) 320 MG tablet Take 320 mg by mouth daily.    . vitamin C (ASCORBIC ACID) 500 MG tablet Take 500 mg by mouth daily.    Marland Kitchen VITAMIN E PO Take 1 tablet by mouth daily.    . carbidopa-levodopa (SINEMET IR) 25-100 MG tablet Take 1 tablet by mouth 3 (three) times daily before meals. (Patient not taking: Reported on 03/19/2015) 90 tablet 6   No  facility-administered medications prior to visit.    PAST MEDICAL HISTORY: Past Medical History  Diagnosis Date  . Hypertension   . Glaucoma   . Back pain   . Arthritis     osteoporosis  . COPD (chronic obstructive pulmonary disease) (HCC)   . Parkinson's disease (HCC)     PAST SURGICAL HISTORY: Past Surgical History  Procedure Laterality Date  . Eye surgery    . Cataract extraction    . Tonsillectomy    . Tubal ligation      FAMILY HISTORY: Family History  Problem Relation Age of Onset  . Heart failure Mother     SOCIAL HISTORY:  Social History   Social History  . Marital Status: Married    Spouse Name:  Normand Sloopheodore  . Number of Children: 5  . Years of Education: 3   Occupational History  . Not on file.   Social History Main Topics  . Smoking status: Never Smoker   . Smokeless tobacco: Never Used  . Alcohol Use: No  . Drug Use: No  . Sexual Activity: Not on file   Other Topics Concern  . Not on file   Social History Narrative   Lives at home with husband, family   Caffeine use- tea 2 cups daily, decaf green tea      PHYSICAL EXAM  GENERAL EXAM/CONSTITUTIONAL: Vitals:  Filed Vitals:   03/19/15 1316  BP: 172/79  Pulse: 64  Height: 5' (1.524 m)  Weight: 97 lb (43.999 kg)   Body mass index is 18.94 kg/(m^2). No exam data present  Patient is in no distress; well developed, nourished and groomed; neck is supple  MASKED FACIES  CARDIOVASCULAR:  Examination of carotid arteries is normal; no carotid bruits  Regular rate and rhythm, no murmurs  Examination of peripheral vascular system by observation and palpation is normal  EYES:  Ophthalmoscopic exam of optic discs and posterior segments is normal; no papilledema or hemorrhages  MUSCULOSKELETAL:  Gait, strength, tone, movements noted in Neurologic exam below  NEUROLOGIC: MENTAL STATUS:  No flowsheet data found.  awake, alert, oriented to person, place and time  recent and remote memory [facility]  DECR attention and concentration  DECR FLUENCY; comprehension intact, naming intact,   fund of knowledge appropriate  CRANIAL NERVE:   2nd - no papilledema on fundoscopic exam  2nd, 3rd, 4th, 6th - pupils equal and reactive to light, visual fields full to confrontation, extraocular muscles intact, no nystagmus  5th - facial sensation symmetric  7th - facial strength symmetric  8th - hearing intact  9th - palate elevates symmetrically, uvula midline  11th - shoulder shrug symmetric  12th - tongue protrusion midline  SOFT HOARSE VOICE  MOTOR:   INTERMITTENT RESTING TREMOR IN BUE; MODERATE COGWHEELING  AND BRADYKINESIA IN BUE AND BLE; DIFFUSE 4/5 STRENGTH  SENSORY:   normal and symmetric to light touch, temperature, vibration  COORDINATION:   finger-nose-finger, fine finger movements --> SLOW  REFLEXES:   deep tendon reflexes TRACE and symmetric  GAIT/STATION:   ABLE TO STAND WITH MILD ASSIST; SHORT STEPS BUT ABLE TO WALK USING ROLLATOR; MILD FREEZING ON TURNING    DIAGNOSTIC DATA (LABS, IMAGING, TESTING) - I reviewed patient records, labs, notes, testing and imaging myself where available.  Lab Results  Component Value Date   WBC 9.7 11/20/2014   HGB 13.3 11/20/2014   HCT 39.2 11/20/2014   MCV 94.0 11/20/2014   PLT 283 11/20/2014      Component Value Date/Time  NA 128* 11/20/2014 2133   K 4.1 11/20/2014 2133   CL 94* 11/20/2014 2133   CO2 26 11/20/2014 2133   GLUCOSE 94 11/20/2014 2133   BUN 14 11/20/2014 2133   CREATININE 0.66 11/20/2014 2133   CALCIUM 8.8* 11/20/2014 2133   PROT 7.7 08/18/2014 1138   ALBUMIN 4.0 08/18/2014 1138   AST 31 08/18/2014 1138   ALT 20 08/18/2014 1138   ALKPHOS 57 08/18/2014 1138   BILITOT 1.1 08/18/2014 1138   GFRNONAA >60 11/20/2014 2133   GFRAA >60 11/20/2014 2133   No results found for: CHOL, HDL, LDLCALC, LDLDIRECT, TRIG, CHOLHDL No results found for: ZOXW9U No results found for: VITAMINB12 No results found for: TSH   02/22/15 CT head [I reviewed images myself and agree with interpretation. -VRP]  - normal    ASSESSMENT AND PLAN  81 y.o. year old female here with progressive resting tremor, bradykinesia, cogwheel rigidity, balance difficulty, masked facies, memory loss. Findings consistent with Parkinson's disease. Will check CT scan to rule out secondary causes. Advised patient to use home health therapy services, Rollator walker, and additional help from family. Will try empiric trial of carbidopa/levodopa for symptom management. Patient will return in 6 weeks for follow-up.  Dx:  Parkinson's disease (HCC)     PLAN: - caution with balance and walking; use rollator and continue exercises - may consider 1 more trial of carb/levo, this time with small snack - may consider amantadine in future  Return in about 4 months (around 07/18/2015).    Suanne Marker, MD 03/19/2015, 1:57 PM Certified in Neurology, Neurophysiology and Neuroimaging  [facility] [address] [place], [place] [address] [phone]

## 2015-03-26 ENCOUNTER — Ambulatory Visit: Payer: Medicare HMO | Admitting: Diagnostic Neuroimaging

## 2015-03-28 ENCOUNTER — Telehealth: Payer: Self-pay | Admitting: Diagnostic Neuroimaging

## 2015-03-28 NOTE — Telephone Encounter (Signed)
Lanora ManisElizabeth a Physical Therapist with [facility] is calling regarding the patient. She needs a verbal ok for 2 additional physical therapy visits for the patient. Thank you.

## 2015-03-28 NOTE — Telephone Encounter (Signed)
LVM for Lanora ManisElizabeth, [facility] and informed her patient may receive more PT as she recommends, per Dr Marjory LiesPenumalli. Left this caller's name, number in case any further questions.

## 2015-07-18 ENCOUNTER — Ambulatory Visit (INDEPENDENT_AMBULATORY_CARE_PROVIDER_SITE_OTHER): Payer: Medicare HMO | Admitting: Diagnostic Neuroimaging

## 2015-07-18 ENCOUNTER — Encounter: Payer: Self-pay | Admitting: Diagnostic Neuroimaging

## 2015-07-18 VITALS — BP 161/75 | HR 54 | Wt 97.0 lb

## 2015-07-18 DIAGNOSIS — R413 Other amnesia: Secondary | ICD-10-CM

## 2015-07-18 DIAGNOSIS — M545 Low back pain, unspecified: Secondary | ICD-10-CM

## 2015-07-18 DIAGNOSIS — G20 Parkinson's disease: Secondary | ICD-10-CM | POA: Diagnosis not present

## 2015-07-18 DIAGNOSIS — R269 Unspecified abnormalities of gait and mobility: Secondary | ICD-10-CM | POA: Diagnosis not present

## 2015-07-18 NOTE — Progress Notes (Signed)
[facility]  PATIENT: Sandra Davis DOB: March 02, 1929  REFERRING CLINICIAN: Burnett HISTORY FROM: patient and husband  REASON FOR VISIT: follow up   HISTORICAL  CHIEF COMPLAINT:  Chief Complaint  Patient presents with  . Parkinson's disease    rm 7, husband- Normand Sloop, "no new problems"  . Follow-up    4 month    HISTORY OF PRESENT ILLNESS:   UPDATE 07/18/15: Since last visit, pt did not try carb/levo. Having more back pain. Feeling generally weak.  UPDATE 03/19/15: Patient doing better with PT, OT and rollator. Tried 1 dose of carb/levo (half tab) and felt very weak and dizzy, so she stopped it.   PRIOR HPI (02/12/15): 82 year old ambidextrous female with hypertension, here for evaluation of tremor. Patient reports tremor for past 1-2 months in upper extremity. Husband disagrees and states this is been going on for 1-2 years. Tremor is present mainly at rest but also with certain activities. Tremors affecting both arms. Also patient has had gradually progressive gait and balance deterioration. She's had multiple falls. Symptoms have been worsening over the past 1-2 years as well. Patient started using a cane approximately one year ago. She has a walker at home but apparently does not use it because it is stored in the basement. Patient and husband moved in to live with their son and his family in November 2015 due to more difficulties with them maintaining her activities of daily living. Patient having some memory loss issues. Also with dizziness, incontinence and tremor.   REVIEW OF SYSTEMS: Full 14 system review of systems performed and notable only for as per history of present illness.  ALLERGIES: Allergies  Allergen Reactions  . Other Other (See Comments)    Patient states that she has taken some blood pressure medicines in the past and has experienced side effects.  Patient cannot remember the names.  States that the main reaction is dizziness.  Marland Kitchen  Spironolactone Other (See Comments)    Blurred vision, sweating of feet    HOME MEDICATIONS: Outpatient Prescriptions Prior to Visit  Medication Sig Dispense Refill  . alendronate (FOSAMAX) 70 MG tablet Take 1 tablet by mouth once a week. Takes on Thursday.  1  . amLODipine (NORVASC) 5 MG tablet Take 5 mg by mouth daily.   1  . aspirin EC 325 MG tablet Take 325 mg by mouth daily.    Marland Kitchen atenolol (TENORMIN) 25 MG tablet Take 50 mg by mouth 2 (two) times daily.     . budesonide-formoterol (SYMBICORT) 160-4.5 MCG/ACT inhaler INHALE 2 PUFFS INTO THE LUNGS EVERY 12 HOURS.    Marland Kitchen Cholecalciferol (VITAMIN D3) 5000 UNITS CAPS Take 1 capsule by mouth daily.    . Glucosamine-MSM-Hyaluronic Acd (JOINT HEALTH PO) Take 1 capsule by mouth 2 (two) times daily. TRI-MOTION JOINT HEALTH    . meloxicam (MOBIC) 7.5 MG tablet 7.5 mg as needed.  3  . Multiple Vitamins-Minerals (HAIR/SKIN/NAILS PO) Take 1 tablet by mouth daily.    . Omega-3 Fatty Acids (FISH OIL PO) Take 1 capsule by mouth daily.    Marland Kitchen omeprazole (PRILOSEC) 40 MG capsule 40 mg.    . timolol (BETIMOL) 0.5 % ophthalmic solution Place 1 drop into both eyes 2 (two) times daily.    . valsartan (DIOVAN) 320 MG tablet Take 320 mg by mouth daily.    . vitamin C (ASCORBIC ACID) 500 MG tablet Take 500 mg by mouth daily.    Marland Kitchen VITAMIN E PO Take 1 tablet by mouth daily.  No facility-administered medications prior to visit.    PAST MEDICAL HISTORY: Past Medical History  Diagnosis Date  . Hypertension   . Glaucoma   . Back pain   . Arthritis     osteoporosis  . COPD (chronic obstructive pulmonary disease) (HCC)   . Parkinson's disease (HCC)   . Glaucoma     PAST SURGICAL HISTORY: Past Surgical History  Procedure Laterality Date  . Eye surgery    . Cataract extraction    . Tonsillectomy    . Tubal ligation      FAMILY HISTORY: Family History  Problem Relation Age of Onset  . Heart failure Mother     SOCIAL HISTORY:  Social History    Social History  . Marital Status: Married    Spouse Name: Normand Sloopheodore  . Number of Children: 5  . Years of Education: 3   Occupational History  . Not on file.   Social History Main Topics  . Smoking status: Never Smoker   . Smokeless tobacco: Never Used  . Alcohol Use: No  . Drug Use: No  . Sexual Activity: Not on file   Other Topics Concern  . Not on file   Social History Narrative   Lives at home with husband, family   Caffeine use- tea 2 cups daily, decaf green tea      PHYSICAL EXAM  GENERAL EXAM/CONSTITUTIONAL: Vitals:  Filed Vitals:   07/18/15 1250  BP: 161/75  Pulse: 54  Weight: 97 lb (43.999 kg)   Body mass index is 18.94 kg/(m^2). No exam data present  Patient is in no distress; well developed, nourished and groomed; neck is supple  MASKED FACIES  FRAIL APPEARING  CARDIOVASCULAR:  Examination of carotid arteries is normal; no carotid bruits  Regular rate and rhythm, no murmurs  Examination of peripheral vascular system by observation and palpation is normal  EYES:  Ophthalmoscopic exam of optic discs and posterior segments is normal; no papilledema or hemorrhages  MUSCULOSKELETAL:  Gait, strength, tone, movements noted in Neurologic exam below  NEUROLOGIC: MENTAL STATUS:  No flowsheet data found.  awake, alert, oriented to person, place and time  recent and remote memory [facility]  DECR attention and concentration  DECR FLUENCY; comprehension intact, naming intact,   fund of knowledge appropriate  CRANIAL NERVE:   2nd - no papilledema on fundoscopic exam  2nd, 3rd, 4th, 6th - pupils equal and reactive to light, visual fields full to confrontation, extraocular muscles intact, no nystagmus  5th - facial sensation symmetric  7th - facial strength symmetric  8th - hearing intact  9th - palate elevates symmetrically, uvula midline  11th - shoulder shrug symmetric  12th - tongue protrusion midline  SOFT HOARSE VOICE  MOTOR:    INTERMITTENT RESTING TREMOR IN BUE; MODERATE COGWHEELING AND BRADYKINESIA IN BUE AND BLE; DIFFUSE 4/5 STRENGTH  SENSORY:   normal and symmetric to light touch, temperature, vibration  COORDINATION:   finger-nose-finger, fine finger movements --> SLOW  REFLEXES:   deep tendon reflexes TRACE and symmetric  GAIT/STATION:   ABLE TO STAND WITH MILD ASSIST; SHORT STEPS BUT ABLE TO WALK USING ROLLATOR; MILD FREEZING ON TURNING    DIAGNOSTIC DATA (LABS, IMAGING, TESTING) - I reviewed patient records, labs, notes, testing and imaging myself where available.  Lab Results  Component Value Date   WBC 9.7 11/20/2014   HGB 13.3 11/20/2014   HCT 39.2 11/20/2014   MCV 94.0 11/20/2014   PLT 283 11/20/2014  Component Value Date/Time   NA 128* 11/20/2014 2133   K 4.1 11/20/2014 2133   CL 94* 11/20/2014 2133   CO2 26 11/20/2014 2133   GLUCOSE 94 11/20/2014 2133   BUN 14 11/20/2014 2133   CREATININE 0.66 11/20/2014 2133   CALCIUM 8.8* 11/20/2014 2133   PROT 7.7 08/18/2014 1138   ALBUMIN 4.0 08/18/2014 1138   AST 31 08/18/2014 1138   ALT 20 08/18/2014 1138   ALKPHOS 57 08/18/2014 1138   BILITOT 1.1 08/18/2014 1138   GFRNONAA >60 11/20/2014 2133   GFRAA >60 11/20/2014 2133   No results found for: CHOL, HDL, LDLCALC, LDLDIRECT, TRIG, CHOLHDL No results found for: UEAV4U No results found for: VITAMINB12 No results found for: TSH   02/22/15 CT head [I reviewed images myself and agree with interpretation. -VRP]  - normal    ASSESSMENT AND PLAN  82 y.o. year old female here with progressive resting tremor, bradykinesia, cogwheel rigidity, balance difficulty, masked facies, memory loss. Findings consistent with Parkinson's disease. Will check CT scan to rule out secondary causes. Advised patient to use home health therapy services, rollator walker, and additional help from family.   Dx:  Parkinson's disease (HCC)  Gait difficulty  Bilateral low back pain without  sciatica  Memory loss    PLAN: - caution with balance and walking; use rollator and continue exercises - refer to home palliative care consult - follow up with PCP re: pain mgmt - no further parkinson's dz treatment as patient reluctant to try any other medication  Orders Placed This Encounter  Procedures  . Amb Referral to Palliative Care   Return if symptoms worsen or fail to improve, for return to PCP.    Suanne Marker, MD 07/18/2015, 1:24 PM Certified in Neurology, Neurophysiology and Neuroimaging  [facility] [address], [address] [place], [place] [address] [phone]

## 2015-07-30 DIAGNOSIS — R251 Tremor, unspecified: Secondary | ICD-10-CM | POA: Diagnosis not present

## 2015-08-21 ENCOUNTER — Emergency Department (HOSPITAL_COMMUNITY)
Admission: EM | Admit: 2015-08-21 | Discharge: 2015-08-21 | Disposition: A | Discharge status: Home / Self Care | Payer: Medicare HMO | Attending: Emergency Medicine | Admitting: Emergency Medicine

## 2015-08-21 ENCOUNTER — Encounter (HOSPITAL_COMMUNITY): Payer: Self-pay

## 2015-08-21 ENCOUNTER — Emergency Department (HOSPITAL_COMMUNITY): Payer: Medicare HMO

## 2015-08-21 DIAGNOSIS — R42 Dizziness and giddiness: Secondary | ICD-10-CM | POA: Diagnosis present

## 2015-08-21 DIAGNOSIS — R200 Anesthesia of skin: Secondary | ICD-10-CM | POA: Insufficient documentation

## 2015-08-21 DIAGNOSIS — J449 Chronic obstructive pulmonary disease, unspecified: Secondary | ICD-10-CM | POA: Insufficient documentation

## 2015-08-21 DIAGNOSIS — I10 Essential (primary) hypertension: Secondary | ICD-10-CM | POA: Insufficient documentation

## 2015-08-21 DIAGNOSIS — G20 Parkinson's disease: Secondary | ICD-10-CM | POA: Insufficient documentation

## 2015-08-21 DIAGNOSIS — W19XXXA Unspecified fall, initial encounter: Secondary | ICD-10-CM

## 2015-08-21 DIAGNOSIS — R110 Nausea: Secondary | ICD-10-CM | POA: Insufficient documentation

## 2015-08-21 DIAGNOSIS — Z79899 Other long term (current) drug therapy: Secondary | ICD-10-CM | POA: Insufficient documentation

## 2015-08-21 DIAGNOSIS — M1990 Unspecified osteoarthritis, unspecified site: Secondary | ICD-10-CM | POA: Diagnosis not present

## 2015-08-21 DIAGNOSIS — Z7982 Long term (current) use of aspirin: Secondary | ICD-10-CM | POA: Insufficient documentation

## 2015-08-21 LAB — CBC WITH DIFFERENTIAL/PLATELET
Basophils Absolute: 0.1 10*3/uL (ref 0.0–0.1)
Basophils Relative: 1 %
Eosinophils Absolute: 0.4 10*3/uL (ref 0.0–0.7)
Eosinophils Relative: 6 %
HEMATOCRIT: 40.7 % (ref 36.0–46.0)
HEMOGLOBIN: 13.6 g/dL (ref 12.0–15.0)
LYMPHS ABS: 1.5 10*3/uL (ref 0.7–4.0)
Lymphocytes Relative: 22 %
MCH: 31.6 pg (ref 26.0–34.0)
MCHC: 33.4 g/dL (ref 30.0–36.0)
MCV: 94.7 fL (ref 78.0–100.0)
MONOS PCT: 9 %
Monocytes Absolute: 0.6 10*3/uL (ref 0.1–1.0)
NEUTROS ABS: 4.3 10*3/uL (ref 1.7–7.7)
NEUTROS PCT: 62 %
Platelets: 257 10*3/uL (ref 150–400)
RBC: 4.3 MIL/uL (ref 3.87–5.11)
RDW: 12.8 % (ref 11.5–15.5)
WBC: 6.9 10*3/uL (ref 4.0–10.5)

## 2015-08-21 LAB — COMPREHENSIVE METABOLIC PANEL
ALT: 15 U/L (ref 14–54)
ANION GAP: 5 (ref 5–15)
AST: 26 U/L (ref 15–41)
Albumin: 3.9 g/dL (ref 3.5–5.0)
Alkaline Phosphatase: 49 U/L (ref 38–126)
BILIRUBIN TOTAL: 1.1 mg/dL (ref 0.3–1.2)
BUN: 15 mg/dL (ref 6–20)
CALCIUM: 9.3 mg/dL (ref 8.9–10.3)
CO2: 27 mmol/L (ref 22–32)
CREATININE: 0.94 mg/dL (ref 0.44–1.00)
Chloride: 98 mmol/L — ABNORMAL LOW (ref 101–111)
GFR calc non Af Amer: 54 mL/min — ABNORMAL LOW (ref >60)
Glucose, Bld: 109 mg/dL — ABNORMAL HIGH (ref 65–99)
Potassium: 5 mmol/L (ref 3.5–5.1)
Sodium: 130 mmol/L — ABNORMAL LOW (ref 135–145)
Total Protein: 7.7 g/dL (ref 6.5–8.1)

## 2015-08-21 LAB — TSH: TSH: 1.453 u[IU]/mL (ref 0.350–4.500)

## 2015-08-21 LAB — URINALYSIS, ROUTINE W REFLEX MICROSCOPIC
BILIRUBIN URINE: NEGATIVE
Glucose, UA: NEGATIVE mg/dL
HGB URINE DIPSTICK: NEGATIVE
Ketones, ur: NEGATIVE mg/dL
Leukocytes, UA: NEGATIVE
Nitrite: NEGATIVE
PROTEIN: NEGATIVE mg/dL
Specific Gravity, Urine: 1.01 (ref 1.005–1.030)
pH: 7 (ref 5.0–8.0)

## 2015-08-21 LAB — TROPONIN I

## 2015-08-21 MED ORDER — SODIUM CHLORIDE 0.9 % IV BOLUS (SEPSIS)
500.0000 mL | Freq: Once | INTRAVENOUS | Status: AC
  Administered 2015-08-21: 500 mL via INTRAVENOUS

## 2015-08-21 NOTE — ED Notes (Signed)
Pt's husband reports pt has fallen twice this week.  Last time was this morning on her way to the bathroom.  Pt says she is dizzy when she stands up.  Pt says after standing for a minute, the dizziness goes away.  Pt c/o pain to head.  Denies LOC.

## 2015-08-21 NOTE — ED Provider Notes (Signed)
CSN: [id number]     Arrival date & time 08/21/15  1207 History  By signing my name below, I, Sandra Davis, attest that this documentation has been prepared under the direction and in the presence of Blane OharaJoshua Zerina Hallinan, MD.   Electronically Signed: Iona Beardhristian Davis, ED Scribe. 08/21/2015. 3:05 PM   Chief Complaint  Patient presents with  . Fall  . Dizziness   The history is provided by the patient and the spouse. No language interpreter was used.   HPI Comments: Sandra Davis is a 82 y.o. female with PMHx of HTN, glaucoma, COPD, back pain, and parkinson's disease who presents to the Emergency Department complaining of gradual onset, dizziness, ongoing for about one week. Pt reports she has fallen twice this week with the most recent episode today. Pt reports associated head pain from the incident today.  No LOC noted in the incident. She also complains of numbness in her left foot, generalized weakness, and nausea. No other associated symptoms noted. No worsening or alleviating factors noted. Pt denies fevers, chills, tingling, back pain, chest pain, abdominal pain, or any other pertinent symptoms. She uses a walker for ambulation at home.   Past Medical History  Diagnosis Date  . Hypertension   . Glaucoma   . Back pain   . Arthritis     osteoporosis  . COPD (chronic obstructive pulmonary disease) (HCC)   . Parkinson's disease (HCC)   . Glaucoma    Past Surgical History  Procedure Laterality Date  . Eye surgery    . Cataract extraction    . Tonsillectomy    . Tubal ligation     Family History  Problem Relation Age of Onset  . Heart failure Mother    Social History  Substance Use Topics  . Smoking status: Never Smoker   . Smokeless tobacco: Never Used  . Alcohol Use: No   OB History    Gravida Para Term Preterm AB TAB SAB Ectopic Multiple Living   5 5 5             Review of Systems  Constitutional: Negative for fever and chills.  Cardiovascular: Negative for chest  pain.  Gastrointestinal: Positive for nausea. Negative for abdominal pain.  Musculoskeletal: Negative for back pain.  Neurological: Positive for dizziness, weakness and numbness.  All other systems reviewed and are negative.    Allergies  Other and Spironolactone  Home Medications   Prior to Admission medications   Medication Sig Start Date End Date Taking? Authorizing Provider  alendronate (FOSAMAX) 70 MG tablet Take 1 tablet by mouth once a week. Takes on Thursday. 05/11/14  Yes Historical Provider, MD  amLODipine (NORVASC) 5 MG tablet Take 5 mg by mouth daily.  06/27/14  Yes Historical Provider, MD  aspirin EC 325 MG tablet Take 325 mg by mouth daily.   Yes Historical Provider, MD  atenolol (TENORMIN) 50 MG tablet Take 50 mg by mouth 2 (two) times daily.   Yes Historical Provider, MD  budesonide-formoterol (SYMBICORT) 160-4.5 MCG/ACT inhaler INHALE 2 PUFFS INTO THE LUNGS EVERY 12 HOURS. 05/30/14  Yes Historical Provider, MD  CALCIUM-MAGNESIUM-ZINC PO Take 1 capsule by mouth daily.   Yes Historical Provider, MD  Cholecalciferol (VITAMIN D3) 5000 UNITS CAPS Take 1 capsule by mouth daily.   Yes Historical Provider, MD  GARLIC PO Take 1 capsule by mouth daily.   Yes Historical Provider, MD  Glucosamine-MSM-Hyaluronic Acd (JOINT HEALTH PO) Take 1 capsule by mouth 2 (two) times daily. TRI-MOTION JOINT HEALTH  Yes Historical Provider, MD  Multiple Vitamins-Minerals (HAIR/SKIN/NAILS PO) Take 1 tablet by mouth daily.   Yes Historical Provider, MD  Omega-3 Fatty Acids (FISH OIL PO) Take 1 capsule by mouth daily.   Yes Historical Provider, MD  omeprazole (PRILOSEC) 40 MG capsule Take 40 mg by mouth daily as needed (acid reflux).  01/11/15  Yes Historical Provider, MD  OVER THE COUNTER MEDICATION Place 1 drop into both eyes daily as needed (dry eyes).   Yes Historical Provider, MD  timolol (BETIMOL) 0.5 % ophthalmic solution Place 1 drop into both eyes 2 (two) times daily.   Yes Historical Provider,  MD  Travoprost, BAK Free, (TRAVATAN Z) 0.004 % SOLN ophthalmic solution Place 1 drop into both eyes at bedtime.  07/03/15 07/02/16 Yes Historical Provider, MD  valsartan (DIOVAN) 320 MG tablet Take 320 mg by mouth every evening.    Yes Historical Provider, MD  vitamin C (ASCORBIC ACID) 500 MG tablet Take 500 mg by mouth daily.   Yes Historical Provider, MD  VITAMIN E PO Take 1 tablet by mouth daily.   Yes Historical Provider, MD   BP 187/69 mmHg  Pulse 55  Temp(Src) 98.5 F (36.9 C) (Oral)  Resp 20  Ht 5' (1.524 m)  Wt 93 lb (42.185 kg)  BMI 18.16 kg/m2  SpO2 100% Physical Exam  Constitutional: She appears well-developed and well-nourished. No distress.  HENT:  Head: Normocephalic and atraumatic.  Eyes: Conjunctivae and EOM are normal. Pupils are equal, round, and reactive to light.  Pupils 2mm bilateral. No obvious facial droop.   Neck: Neck supple. No tracheal deviation present.  Cardiovascular: Regular rhythm and normal heart sounds.  Exam reveals no gallop.   No murmur heard. Pulmonary/Chest: Effort normal and breath sounds normal. No respiratory distress. She has no wheezes. She has no rales.  Abdominal: Soft. Bowel sounds are normal. There is no tenderness.  Musculoskeletal: Normal range of motion.  No TTP to midline cervical spine. Normal horizontal movement of head and neck. Mild tremor on right hand and arm. No arm drift. Equal strength in bilateral arms. Generally weak on examination; grossly. 5/5 strength with flexion in BLE. Sensation intact in all four extremities. No focal tenderness in thoracic or lumbar spine.   Neurological: She is alert.  Skin: Skin is warm and dry.  Psychiatric:  Flat affect  Nursing note and vitals reviewed.   ED Course  Procedures (including critical care time) DIAGNOSTIC STUDIES: Oxygen Saturation is 100% on RA, normal by my interpretation.    COORDINATION OF CARE: 12:44 PM Discussed treatment plan which includes EKG, CT cervical spine  without contrast, CT head without contrast, CBC with differential/platelet, CMP, troponin I, and urinalysis with pt at bedside and pt agreed to plan.   Labs Review Labs Reviewed  COMPREHENSIVE METABOLIC PANEL - Abnormal; Notable for the following:    Sodium 130 (*)    Chloride 98 (*)    Glucose, Bld 109 (*)    GFR calc non Af Amer 54 (*)    All other components within normal limits  CBC WITH DIFFERENTIAL/PLATELET  TROPONIN I  URINALYSIS, ROUTINE W REFLEX MICROSCOPIC (NOT AT [facility])  TSH    Imaging Review Ct Head Wo Contrast  08/21/2015  CLINICAL DATA:  Posttraumatic headache after multiple falls. Dizziness. No loss of consciousness. EXAM: CT HEAD WITHOUT CONTRAST CT CERVICAL SPINE WITHOUT CONTRAST TECHNIQUE: Multidetector CT imaging of the head and cervical spine was performed following the standard protocol without intravenous contrast. Multiplanar CT image  reconstructions of the cervical spine were also generated. COMPARISON:  None. FINDINGS: CT HEAD FINDINGS Bony calvarium appears intact. Minimal chronic ischemic white matter disease is noted. No mass effect or midline shift is noted. Ventricular size is within normal limits. There is no evidence of mass lesion, hemorrhage or acute infarction. CT CERVICAL SPINE FINDINGS No fracture or spondylolisthesis is noted. Moderate degenerative disc disease is noted at C5-6 and C6-7 with anterior osteophyte formation. Visualized lung apices are unremarkable. Degenerative changes seen involving the left posterior facet joint of C7-T1. IMPRESSION: Minimal chronic ischemic white matter disease. No acute intracranial abnormality seen. Moderate multilevel degenerative disc disease is noted. No acute abnormality seen in the cervical spine. Electronically Signed   By: Lupita Raider, M.D.   On: 08/21/2015 14:01   Ct Cervical Spine Wo Contrast  08/21/2015  CLINICAL DATA:  Posttraumatic headache after multiple falls. Dizziness. No loss of consciousness. EXAM: CT  HEAD WITHOUT CONTRAST CT CERVICAL SPINE WITHOUT CONTRAST TECHNIQUE: Multidetector CT imaging of the head and cervical spine was performed following the standard protocol without intravenous contrast. Multiplanar CT image reconstructions of the cervical spine were also generated. COMPARISON:  None. FINDINGS: CT HEAD FINDINGS Bony calvarium appears intact. Minimal chronic ischemic white matter disease is noted. No mass effect or midline shift is noted. Ventricular size is within normal limits. There is no evidence of mass lesion, hemorrhage or acute infarction. CT CERVICAL SPINE FINDINGS No fracture or spondylolisthesis is noted. Moderate degenerative disc disease is noted at C5-6 and C6-7 with anterior osteophyte formation. Visualized lung apices are unremarkable. Degenerative changes seen involving the left posterior facet joint of C7-T1. IMPRESSION: Minimal chronic ischemic white matter disease. No acute intracranial abnormality seen. Moderate multilevel degenerative disc disease is noted. No acute abnormality seen in the cervical spine. Electronically Signed   By: Lupita Raider, M.D.   On: 08/21/2015 14:01   I have personally reviewed and evaluated these images and lab results as part of my medical decision-making.   EKG Interpretation   Date/Time:  Wednesday Aug 21 2015 12:32:00 EDT Ventricular Rate:  58 PR Interval:  173 QRS Duration: 86 QT Interval:  421 QTC Calculation: 413 R Axis:   -2 Text Interpretation:  Sinus rhythm Confirmed by Jodi Mourning MD, Ivin Booty [id number]  on 08/21/2015 1:27:48 PM      MDM   Final diagnoses:  Fall, initial encounter  Dizziness   Patient presents after second fall this week. Dizziness when standing. No recent medications. Mild dehydration clinically. Plan for screening blood work, small bolus of IV fluids CT scan of the head and likely outpatient follow-up.  Pt well appearing on recheck, tolerating a meal. No acute findings on work up in ED>  Stable to fup with  her neurologist/ pcp.  Results and differential diagnosis were discussed with the patient/parent/guardian. Xrays were independently reviewed by myself.  Close follow up outpatient was discussed, comfortable with the plan.   Medications  sodium chloride 0.9 % bolus 500 mL (0 mLs Intravenous Stopped 08/21/15 1503)    Filed Vitals:   08/21/15 1300 08/21/15 1330 08/21/15 1400 08/21/15 1430  BP: 132/72 119/61 124/65 151/92  Pulse: 54 55 56 58  Temp:      TempSrc:      Resp: 16 17 16 16   Height:      Weight:      SpO2: 96% 97% 96% 97%    Final diagnoses:  Fall, initial encounter  Dizziness      Blane Ohara,  MD 08/21/15 1506

## 2015-08-21 NOTE — Discharge Instructions (Signed)
If you were given medicines take as directed.  If you are on coumadin or contraceptives realize their levels and effectiveness is altered by many different medicines.  If you have any reaction (rash, tongues swelling, other) to the medicines stop taking and see a physician.    If your blood pressure was elevated in the ER make sure you follow up for management with a primary doctor or return for chest pain, shortness of breath or stroke symptoms.  Please follow up as directed and return to the ER or see a physician for new or worsening symptoms.  Thank you. Filed Vitals:   08/21/15 1300 08/21/15 1330 08/21/15 1400 08/21/15 1430  BP: 132/72 119/61 124/65 151/92  Pulse: 54 55 56 58  Temp:      TempSrc:      Resp: 16 17 16 16   Height:      Weight:      SpO2: 96% 97% 96% 97%

## 2015-09-24 ENCOUNTER — Telehealth: Payer: Self-pay | Admitting: Diagnostic Neuroimaging

## 2015-09-24 NOTE — Telephone Encounter (Signed)
Called husband and informed him this RN would have no way of knowing where the walker was bought because this office did not provide her with a walker.  He stated he would call Home Health company and ask them, and thanked this RN for the call.

## 2015-09-24 NOTE — Telephone Encounter (Signed)
Pt's husband called sts a bolt fell off the walker. He is wanting to know where it came from so he can call to get it repaired. He gave the product # in hopes the RN would call DME supply co. Product# [id number].Please call

## 2016-03-13 ENCOUNTER — Encounter (HOSPITAL_COMMUNITY): Payer: Self-pay | Admitting: *Deleted

## 2016-03-13 ENCOUNTER — Emergency Department (HOSPITAL_COMMUNITY): Payer: Medicare HMO

## 2016-03-13 ENCOUNTER — Emergency Department (HOSPITAL_COMMUNITY)
Admission: EM | Admit: 2016-03-13 | Discharge: 2016-03-13 | Disposition: A | Discharge status: Home / Self Care | Payer: Medicare HMO | Attending: Emergency Medicine | Admitting: Emergency Medicine

## 2016-03-13 DIAGNOSIS — Z79899 Other long term (current) drug therapy: Secondary | ICD-10-CM | POA: Diagnosis not present

## 2016-03-13 DIAGNOSIS — Z7982 Long term (current) use of aspirin: Secondary | ICD-10-CM | POA: Insufficient documentation

## 2016-03-13 DIAGNOSIS — I10 Essential (primary) hypertension: Secondary | ICD-10-CM | POA: Insufficient documentation

## 2016-03-13 DIAGNOSIS — R079 Chest pain, unspecified: Secondary | ICD-10-CM | POA: Insufficient documentation

## 2016-03-13 DIAGNOSIS — R251 Tremor, unspecified: Secondary | ICD-10-CM | POA: Insufficient documentation

## 2016-03-13 DIAGNOSIS — J449 Chronic obstructive pulmonary disease, unspecified: Secondary | ICD-10-CM | POA: Insufficient documentation

## 2016-03-13 LAB — CBC
HEMATOCRIT: 40.3 % (ref 36.0–46.0)
Hemoglobin: 13.6 g/dL (ref 12.0–15.0)
MCH: 32.9 pg (ref 26.0–34.0)
MCHC: 33.7 g/dL (ref 30.0–36.0)
MCV: 97.6 fL (ref 78.0–100.0)
PLATELETS: 273 10*3/uL (ref 150–400)
RBC: 4.13 MIL/uL (ref 3.87–5.11)
RDW: 12.5 % (ref 11.5–15.5)
WBC: 8.6 10*3/uL (ref 4.0–10.5)

## 2016-03-13 LAB — URINE MICROSCOPIC-ADD ON: RBC / HPF: NONE SEEN RBC/hpf (ref 0–5)

## 2016-03-13 LAB — BASIC METABOLIC PANEL
Anion gap: 7 (ref 5–15)
BUN: 15 mg/dL (ref 6–20)
CALCIUM: 9.3 mg/dL (ref 8.9–10.3)
CO2: 28 mmol/L (ref 22–32)
CREATININE: 0.71 mg/dL (ref 0.44–1.00)
Chloride: 92 mmol/L — ABNORMAL LOW (ref 101–111)
GFR calc Af Amer: >60 mL/min (ref >60)
GLUCOSE: 131 mg/dL — AB (ref 65–99)
Potassium: 4.5 mmol/L (ref 3.5–5.1)
SODIUM: 127 mmol/L — AB (ref 135–145)

## 2016-03-13 LAB — URINALYSIS, ROUTINE W REFLEX MICROSCOPIC
BILIRUBIN URINE: NEGATIVE
Glucose, UA: NEGATIVE mg/dL
HGB URINE DIPSTICK: NEGATIVE
KETONES UR: NEGATIVE mg/dL
Nitrite: NEGATIVE
Protein, ur: NEGATIVE mg/dL
SPECIFIC GRAVITY, URINE: 1.01 (ref 1.005–1.030)
pH: 7 (ref 5.0–8.0)

## 2016-03-13 LAB — TROPONIN I

## 2016-03-13 MED ORDER — SODIUM CHLORIDE 0.9 % IV BOLUS (SEPSIS)
500.0000 mL | Freq: Once | INTRAVENOUS | Status: AC
  Administered 2016-03-13: 500 mL via INTRAVENOUS

## 2016-03-13 NOTE — ED Provider Notes (Addendum)
[facility]-EMERGENCY DEPT Provider Note   CSN: [id number] Arrival date & time: 03/13/16  1353     History   Chief Complaint Chief Complaint  Patient presents with  . Chest Pain    HPI Sandra Davis is a 82 y.o. female.  She presents for evaluation of 2 episodes of chest pain, one last night and one today. Both resolved spontaneously. She is unable to specify any associated complaints. She is a poor historian. Her husband is with her and gives most of the history. Patient states she was able to eat both breakfast and lunch today. She denies vomiting, fever, focal weakness or dizziness at this time. She has had some mild dizziness when coughing recently but does not produce sputum when coughing. She is taking her usual medications. She has a follow-up with her PCP, next week. No recent changes in medications. There are no other known modifying factors.  HPI  Past Medical History:  Diagnosis Date  . Arthritis    osteoporosis  . Back pain   . COPD (chronic obstructive pulmonary disease) (HCC)   . Glaucoma   . Glaucoma   . Hypertension   . Parkinson's disease (HCC)     There are no active problems to display for this patient.   Past Surgical History:  Procedure Laterality Date  . CATARACT EXTRACTION    . EYE SURGERY    . TONSILLECTOMY    . TUBAL LIGATION      OB History    Gravida Para Term Preterm AB Living   5 5 5          SAB TAB Ectopic Multiple Live Births                   Home Medications    Prior to Admission medications   Medication Sig Start Date End Date Taking? Authorizing Provider  albuterol (PROVENTIL HFA;VENTOLIN HFA) 108 (90 Base) MCG/ACT inhaler Inhale 1-2 puffs into the lungs every 6 (six) hours as needed for wheezing or shortness of breath.   Yes Historical Provider, MD  amLODipine (NORVASC) 5 MG tablet Take 5 mg by mouth daily.    Yes Historical Provider, MD  aspirin EC 325 MG tablet Take 325 mg by mouth daily.   Yes Historical Provider, MD    atenolol (TENORMIN) 50 MG tablet Take 50 mg by mouth daily.    Yes Historical Provider, MD  budesonide-formoterol (SYMBICORT) 160-4.5 MCG/ACT inhaler Inhale 2 puffs into the lungs 2 (two) times daily.   Yes Historical Provider, MD  calcium carbonate (OSCAL) 1500 (600 Ca) MG TABS tablet Take 600 mg of elemental calcium by mouth 2 (two) times daily with a meal.    Yes Historical Provider, MD  CALCIUM-MAGNESIUM-ZINC PO Take 1 tablet by mouth daily.    Yes Historical Provider, MD  carboxymethylcellulose (REFRESH PLUS) 0.5 % SOLN Place 1 drop into both eyes as needed (for dry eyes).   Yes Historical Provider, MD  Cholecalciferol (VITAMIN D3) 5000 UNITS CAPS Take 5,000 Units by mouth daily.    Yes Historical Provider, MD  Garlic 1000 MG CAPS Take 1,000 mg by mouth daily.   Yes Historical Provider, MD  glucosamine-chondroitin 500-400 MG tablet Take 1 tablet by mouth 2 (two) times daily.   Yes Historical Provider, MD  Multiple Vitamin (MULTIVITAMIN WITH MINERALS) TABS tablet Take 1 tablet by mouth daily.   Yes Historical Provider, MD  omega-3 acid ethyl esters (LOVAZA) 1 g capsule Take 1 g by mouth daily.   Yes  Historical Provider, MD  omeprazole (PRILOSEC) 40 MG capsule Take 40 mg by mouth daily as needed (for acid reflux).    Yes Historical Provider, MD  timolol (BETIMOL) 0.5 % ophthalmic solution Place 1 drop into both eyes 2 (two) times daily.   Yes Historical Provider, MD  valsartan (DIOVAN) 320 MG tablet Take 320 mg by mouth every evening.    Yes Historical Provider, MD  vitamin C (ASCORBIC ACID) 500 MG tablet Take 500 mg by mouth daily.   Yes Historical Provider, MD  vitamin E 400 UNIT capsule Take 400 Units by mouth daily.   Yes Historical Provider, MD    Family History Family History  Problem Relation Age of Onset  . Heart failure Mother     Social History Social History  Substance Use Topics  . Smoking status: Never Smoker  . Smokeless tobacco: Never Used  . Alcohol use No      Allergies   Hydrochlorothiazide; Lisinopril; Other; and Spironolactone   Review of Systems Review of Systems  All other systems reviewed and are negative.    Physical Exam Updated Vital Signs BP 142/83   Pulse 64   Temp 97.9 F (36.6 C) (Rectal)   Resp 18   Ht 4\' 9"  (1.448 m)   Wt 90 lb (40.8 kg)   SpO2 94%   BMI 19.48 kg/m   Physical Exam  Constitutional: She appears well-developed.  Elderly, frail  HENT:  Head: Normocephalic and atraumatic.  Eyes: Conjunctivae and EOM are normal. Pupils are equal, round, and reactive to light.  Neck: Normal range of motion and phonation normal. Neck supple.  Cardiovascular: Normal rate and regular rhythm.   Pulmonary/Chest: Effort normal and breath sounds normal. She exhibits no tenderness.  Abdominal: Soft. She exhibits no distension. There is no tenderness. There is no guarding.  Musculoskeletal: Normal range of motion.  Neurological: She is alert. She exhibits normal muscle tone. Coordination normal.  No dysarthria or aphasia. Questionable left facial droop. Normal strength and sensation in arms and legs bilaterally. Nonspecific rhythmic tremor most consistent with senile tremor.  Skin: Skin is warm and dry.  Psychiatric: She has a normal mood and affect. Her behavior is normal.  Nursing note and vitals reviewed.    ED Treatments / Results  Labs (all labs ordered are listed, but only abnormal results are displayed) Labs Reviewed  BASIC METABOLIC PANEL - Abnormal; Notable for the following:       Result Value   Sodium 127 (*)    Chloride 92 (*)    Glucose, Bld 131 (*)    All other components within normal limits  URINALYSIS, ROUTINE W REFLEX MICROSCOPIC (NOT AT [facility]) - Abnormal; Notable for the following:    Leukocytes, UA TRACE (*)    All other components within normal limits  URINE MICROSCOPIC-ADD ON - Abnormal; Notable for the following:    Squamous Epithelial / LPF 0-5 (*)    Bacteria, UA FEW (*)    All other  components within normal limits  CBC  TROPONIN I    EKG  EKG Interpretation  Date/Time:  Friday March 13 2016 14:07:13 EST Ventricular Rate:  64 PR Interval:    QRS Duration: 87 QT Interval:  393 QTC Calculation: 406 R Axis:   -10 Text Interpretation:  indeterminate rhythym Artifact since last tracing no significant change Confirmed by Effie ShyWENTZ  MD, Jihan Rudy [phone] on 03/13/2016 2:31:32 PM       Radiology Dg Chest 2 View  Result Date: 03/13/2016  CLINICAL DATA:  Chest pain into LEFT arm for couple hours, shortness of breath since last night, COPD, hypertension, Parkinson's EXAM: CHEST  2 VIEW COMPARISON:  11/20/2014 FINDINGS: Borderline enlargement of cardiac silhouette. Tortuous thoracic aorta with atherosclerotic calcification. Mediastinal contours and pulmonary vascularity normal for Mildly rotated to the RIGHT. Bronchitic emphysematous changes consistent with COPD. No pulmonary infiltrate, pleural effusion or pneumothorax. Bones demineralized. Old healed posttraumatic deformity of the proximal RIGHT humerus. IMPRESSION: COPD changes without acute infiltrate. Borderline enlargement of cardiac silhouette. Electronically Signed   By: Ulyses SouthwardMark  Boles M.D.   On: 03/13/2016 14:35    Procedures Procedures (including critical care time)  Medications Ordered in ED Medications  sodium chloride 0.9 % bolus 500 mL (0 mLs Intravenous Stopped 03/13/16 1638)     Initial Impression / Assessment and Plan / ED Course  I have reviewed the triage vital signs and the nursing notes.  Pertinent labs & imaging results that were available during my care of the patient were reviewed by me and considered in my medical decision making (see chart for details).  Clinical Course     Medications  sodium chloride 0.9 % bolus 500 mL (0 mLs Intravenous Stopped 03/13/16 1638)    Patient Vitals for the past 24 hrs:  BP Temp Temp src Pulse Resp SpO2 Height Weight  03/13/16 1647 - 97.9 F (36.6 C) Rectal - -  - - -  03/13/16 1630 142/83 - - 64 18 94 % - -  03/13/16 1628 - 98.9 F (37.2 C) - - - - - -  03/13/16 1600 140/67 - - 60 14 95 % - -  03/13/16 1530 138/65 - - 60 17 96 % - -  03/13/16 1500 (!) 125/112 - - (!) 57 16 98 % - -  03/13/16 1430 (!) 130/112 - - (!) 59 17 98 % - -  03/13/16 1405 168/73 97.6 F (36.4 C) Oral 63 - 100 % - -  03/13/16 1403 - - - - - - 4\' 9"  (1.448 m) 90 lb (40.8 kg)    5:03 PM Reevaluation with update and discussion. After initial assessment and treatment, an updated evaluation reveals Since evaluating her earlier, her hands have become reddened tip. There are no deformities, drainage or proximal streaking. Findings assess the patient and husband, all questions answered.Mancel Bale. Carmella Kees L    Final Clinical Impressions(s) / ED Diagnoses   Final diagnoses:  Nonspecific chest pain  Tremor    Evaluation of chest pain without evidence for, infarction. Pain is atypical for CAD. Doubt ACS, PE or pneumonia. Nonspecific tremor is present. This has been present for many years. Nonspecific reddening of her hands, which occurred during the evaluation in the emergency department. Doubt cellulitis, or vascular crisis. Consider vasodilation post vasoconstriction i.e., Raynaud's phenomenon.  Nursing Notes Reviewed/ Care Coordinated Applicable Imaging Reviewed Interpretation of Laboratory Data incorporated into ED treatment  The patient appears reasonably screened and/or stabilized for discharge and I doubt any other medical condition or other [facility] requiring further screening, evaluation, or treatment in the ED at this time prior to discharge.  Plan: Home Medications- continue; Home Treatments- rest; return here if the recommended treatment, does not improve the symptoms; Recommended follow up- PCP prn   New Prescriptions New Prescriptions   No medications on file     Mancel BaleElliott Rylin Seavey, MD 03/13/16 1706    Mancel BaleElliott Lulubelle Simcoe, MD 03/13/16 [id number]

## 2016-03-13 NOTE — Discharge Instructions (Signed)
Follow-up with your primary care doctor for checkup, next week.  If the chest pain returns, or is associated with symptoms such as vomiting, shortness of breath, weakness, dizziness or confusion, return here for a checkup.  The tremor which she is having is nonspecific, and does not indicate anything like a stroke.  The redness of her hands is also nonspecific, and may be related to vasodilation.

## 2016-03-13 NOTE — ED Notes (Signed)
Patient transported to X-ray 

## 2016-03-13 NOTE — ED Triage Notes (Signed)
Pt comes in with husband. Husband states that pt had chest pain last night. Pt does not presently have cp. Pt has had a non-productive cough.

## 2016-03-13 NOTE — ED Notes (Signed)
Patient drank ginger ale with no difficulty.

## 2016-03-13 NOTE — ED Notes (Signed)
Pt given ginger ale to drink. 

## 2016-05-13 ENCOUNTER — Encounter (HOSPITAL_COMMUNITY): Payer: Self-pay | Admitting: Emergency Medicine

## 2016-05-13 ENCOUNTER — Emergency Department (HOSPITAL_COMMUNITY): Payer: Medicare HMO

## 2016-05-13 ENCOUNTER — Emergency Department (HOSPITAL_COMMUNITY)
Admission: EM | Admit: 2016-05-13 | Discharge: 2016-05-13 | Disposition: A | Discharge status: Home / Self Care | Payer: Medicare HMO | Attending: Emergency Medicine | Admitting: Emergency Medicine

## 2016-05-13 DIAGNOSIS — M545 Low back pain: Secondary | ICD-10-CM | POA: Insufficient documentation

## 2016-05-13 DIAGNOSIS — M25551 Pain in right hip: Secondary | ICD-10-CM | POA: Diagnosis not present

## 2016-05-13 DIAGNOSIS — Y929 Unspecified place or not applicable: Secondary | ICD-10-CM | POA: Insufficient documentation

## 2016-05-13 DIAGNOSIS — W19XXXA Unspecified fall, initial encounter: Secondary | ICD-10-CM

## 2016-05-13 DIAGNOSIS — Z79899 Other long term (current) drug therapy: Secondary | ICD-10-CM | POA: Diagnosis not present

## 2016-05-13 DIAGNOSIS — J449 Chronic obstructive pulmonary disease, unspecified: Secondary | ICD-10-CM | POA: Insufficient documentation

## 2016-05-13 DIAGNOSIS — M542 Cervicalgia: Secondary | ICD-10-CM | POA: Insufficient documentation

## 2016-05-13 DIAGNOSIS — Y939 Activity, unspecified: Secondary | ICD-10-CM | POA: Diagnosis not present

## 2016-05-13 DIAGNOSIS — W1839XA Other fall on same level, initial encounter: Secondary | ICD-10-CM | POA: Insufficient documentation

## 2016-05-13 DIAGNOSIS — M25552 Pain in left hip: Secondary | ICD-10-CM | POA: Diagnosis not present

## 2016-05-13 DIAGNOSIS — G20 Parkinson's disease: Secondary | ICD-10-CM | POA: Diagnosis not present

## 2016-05-13 DIAGNOSIS — Y999 Unspecified external cause status: Secondary | ICD-10-CM | POA: Insufficient documentation

## 2016-05-13 DIAGNOSIS — I10 Essential (primary) hypertension: Secondary | ICD-10-CM | POA: Insufficient documentation

## 2016-05-13 DIAGNOSIS — Z7982 Long term (current) use of aspirin: Secondary | ICD-10-CM | POA: Diagnosis not present

## 2016-05-13 DIAGNOSIS — S3992XA Unspecified injury of lower back, initial encounter: Secondary | ICD-10-CM | POA: Diagnosis present

## 2016-05-13 DIAGNOSIS — M7918 Myalgia, other site: Secondary | ICD-10-CM

## 2016-05-13 HISTORY — DX: Unsteadiness on feet: R26.81

## 2016-05-13 HISTORY — DX: Tremor, unspecified: R25.1

## 2016-05-13 HISTORY — DX: Cervicalgia: M54.2

## 2016-05-13 MED ORDER — HYDROCODONE-ACETAMINOPHEN 5-325 MG PO TABS: ORAL_TABLET | ORAL | 0 refills | Status: DC

## 2016-05-13 MED ORDER — HYDROCODONE-ACETAMINOPHEN 5-325 MG PO TABS
1.0000 | ORAL_TABLET | Freq: Once | ORAL | Status: AC
Start: 2016-05-13 — End: 2016-05-13
  Administered 2016-05-13: 1 via ORAL
  Filled 2016-05-13: qty 1

## 2016-05-13 NOTE — ED Triage Notes (Signed)
Pt says she fell, not sure of the day.  C/o pain to left leg.Denies hitting head.  C/o back pain rating pain 10/10.

## 2016-05-13 NOTE — Discharge Instructions (Signed)
Take the prescription as directed.  Apply moist heat or ice to the area(s) of discomfort, for 15 minutes at a time, several times per day for the next few days.  Do not fall asleep on a heating or ice pack. Walk with your walker as tolerated until you are seen in follow up.  Call your regular medical doctor tomorrow to schedule a follow up appointment in the next 2 days.  Return to the Emergency Department immediately if worsening.

## 2016-05-13 NOTE — ED Provider Notes (Signed)
[facility]PT Provider Note   CSN: [id number] Arrival date & time: 05/13/16  1414     History   Chief Complaint Chief Complaint  Patient presents with  . Leg Injury    left  . Fall    HPI Sandra Davis is a 83 y.o. female.  HPI  Pt was seen at 1445. Per pt and her family, c/o sudden onset and resolution of one episode of fall that occurred 1.5 weeks ago. Pt states she fell onto her buttocks. Since the fall, pt has c/o low back pain, neck pain, left and right hip pain. Pt has been ambulatory since the fall. Denies new fall. Denies CP/SOB, no abd pain, no N/V/D, no focal motor weakness, no tingling/numbness in extremities.    Past Medical History:  Diagnosis Date  . Arthritis    osteoporosis  . Back pain   . COPD (chronic obstructive pulmonary disease) (HCC)   . Gait instability   . Glaucoma   . Glaucoma   . Hypertension   . Neck pain   . Parkinson's disease (HCC)   . Tremor     There are no active problems to display for this patient.   Past Surgical History:  Procedure Laterality Date  . CATARACT EXTRACTION    . EYE SURGERY    . TONSILLECTOMY    . TUBAL LIGATION      OB History    Gravida Para Term Preterm AB Living   5 5 5          SAB TAB Ectopic Multiple Live Births                   Home Medications    Prior to Admission medications   Medication Sig Start Date End Date Taking? Authorizing Provider  albuterol (PROVENTIL HFA;VENTOLIN HFA) 108 (90 Base) MCG/ACT inhaler Inhale 1-2 puffs into the lungs every 6 (six) hours as needed for wheezing or shortness of breath.   Yes Historical Provider, MD  amLODipine (NORVASC) 5 MG tablet Take 5 mg by mouth daily.    Yes Historical Provider, MD  aspirin EC 325 MG tablet Take 325 mg by mouth daily.   Yes Historical Provider, MD  atenolol (TENORMIN) 50 MG tablet Take 50 mg by mouth daily.    Yes Historical Provider, MD  budesonide-formoterol (SYMBICORT) 160-4.5 MCG/ACT inhaler Inhale 2 puffs into the lungs  2 (two) times daily.   Yes Historical Provider, MD  calcium carbonate (OSCAL) 1500 (600 Ca) MG TABS tablet Take 600 mg of elemental calcium by mouth 2 (two) times daily with a meal.    Yes Historical Provider, MD  CALCIUM-MAGNESIUM-ZINC PO Take 1 tablet by mouth daily.    Yes Historical Provider, MD  carboxymethylcellulose (REFRESH PLUS) 0.5 % SOLN Place 1 drop into both eyes as needed (for dry eyes).   Yes Historical Provider, MD  Cholecalciferol (VITAMIN D3) 5000 UNITS CAPS Take 5,000 Units by mouth daily.    Yes Historical Provider, MD  diclofenac (VOLTAREN) 75 MG EC tablet Take 75 mg by mouth 2 (two) times daily.   Yes Historical Provider, MD  Garlic 1000 MG CAPS Take 1,000 mg by mouth daily.   Yes Historical Provider, MD  HYDROcodone-acetaminophen (NORCO) 7.5-325 MG tablet Take 1 tablet by mouth daily. 05/06/16  Yes Historical Provider, MD  Multiple Vitamin (MULTIVITAMIN WITH MINERALS) TABS tablet Take 1 tablet by mouth daily.   Yes Historical Provider, MD  omega-3 acid ethyl esters (LOVAZA) 1 g capsule Take 1 g  by mouth daily.   Yes Historical Provider, MD  omeprazole (PRILOSEC) 40 MG capsule Take 40 mg by mouth daily as needed (for acid reflux).    Yes Historical Provider, MD  timolol (BETIMOL) 0.5 % ophthalmic solution Place 1 drop into both eyes 2 (two) times daily.   Yes Historical Provider, MD  valsartan (DIOVAN) 320 MG tablet Take 320 mg by mouth every evening.    Yes Historical Provider, MD  vitamin C (ASCORBIC ACID) 500 MG tablet Take 500 mg by mouth daily.   Yes Historical Provider, MD  vitamin E 400 UNIT capsule Take 400 Units by mouth daily.   Yes Historical Provider, MD    Family History Family History  Problem Relation Age of Onset  . Heart failure Mother     Social History Social History  Substance Use Topics  . Smoking status: Never Smoker  . Smokeless tobacco: Never Used  . Alcohol use No     Allergies   Hydrochlorothiazide; Lisinopril; Other; and  Spironolactone   Review of Systems Review of Systems ROS: Statement: All systems negative except as marked or noted in the HPI; Constitutional: Negative for fever and chills. ; ; Eyes: Negative for eye pain, redness and discharge. ; ; ENMT: Negative for ear pain, hoarseness, nasal congestion, sinus pressure and sore throat. ; ; Cardiovascular: Negative for chest pain, palpitations, diaphoresis, dyspnea and peripheral edema. ; ; Respiratory: Negative for cough, wheezing and stridor. ; ; Gastrointestinal: Negative for nausea, vomiting, diarrhea, abdominal pain, blood in stool, hematemesis, jaundice and rectal bleeding. . ; ; Genitourinary: Negative for dysuria, flank pain and hematuria. ; ; Musculoskeletal:+hips pain, low back pain and neck pain. Negative for swelling and new trauma.; ; Skin: Negative for pruritus, rash, abrasions, blisters, bruising and skin lesion.; ; Neuro: Negative for headache, lightheadedness and neck stiffness. Negative for weakness, altered level of consciousness, altered mental status, extremity weakness, paresthesias, involuntary movement, seizure and syncope.       Physical Exam Updated Vital Signs BP 158/69 (BP Location: Left Arm)   Pulse 73   Temp 97.6 F (36.4 C) (Oral)   Resp 20   Ht 5' (1.524 m)   Wt 94 lb (42.6 kg)   SpO2 95%   BMI 18.36 kg/m   Physical Exam 1450: Physical examination: Vital signs and O2 SAT: Reviewed; Constitutional: Well developed, Well nourished, Well hydrated, In no acute distress; Head and Face: Normocephalic, Atraumatic; Eyes: EOMI, PERRL, No scleral icterus; ENMT: Mouth and pharynx normal, Left TM normal, Right TM normal, Mucous membranes moist; Neck: Supple, trachea midline; Spine: +TTP right cervical paraspinal muscles.  +TTP right lumbar paraspinal muscles. +TTP sacrum. No midline CS, TS, LS tenderness.; Cardiovascular: Regular rate and rhythm, No gallop; Respiratory: Breath sounds clear & equal bilaterally, No wheezes, Normal  respiratory effort/excursion; Chest: Nontender, No deformity, Movement normal, No crepitus, No abrasions or ecchymosis.; Abdomen: Soft, Nontender, Nondistended, Normal bowel sounds, No abrasions or ecchymosis.; Genitourinary: No CVA tenderness; Rectal: No blood at urethral meatus, no perineal hematoma, no gross rectal bleeding.; Extremities: +mild TTP left > right hips. No deformity, Otherwise full range of motion major/large joints of bilat UE's and LE's without pain or tenderness to palp, Neurovascularly intact, Pulses normal, No deformity, No edema, Pelvis stable; Neuro: AA&Ox3, vague historian.  Major CN grossly intact. No facial droop. Speech clear. No gross focal motor or sensory deficits in extremities.; Skin: Color normal, Warm, Dry    ED Treatments / Results  Labs (all labs ordered are listed, but  only abnormal results are displayed)   EKG  EKG Interpretation None       Radiology   Procedures Procedures (including critical care time)  Medications Ordered in ED Medications - No data to display   Initial Impression / Assessment and Plan / ED Course  I have reviewed the triage vital signs and the nursing notes.  Pertinent labs & imaging results that were available during my care of the patient were reviewed by me and considered in my medical decision making (see chart for details).  MDM Reviewed: nursing note, previous chart and vitals Interpretation: CT scan and x-ray    Dg Chest 1 View Result Date: 05/13/2016 CLINICAL DATA:  Larey Seat at home last week, pain at LEFT leg, tail bone, and low back, history Parkinson's, hypertension, COPD EXAM: CHEST 1 VIEW COMPARISON:  03/13/2016 FINDINGS: Upper normal heart size. Normal pulmonary vascularity. Calcified tortuous aorta. Lungs clear. No pleural effusion or pneumothorax. Osseous demineralization with old fractures of T12 and proximal RIGHT humerus. IMPRESSION: No acute abnormalities. Aortic atherosclerosis. Electronically Signed    By: Ulyses Southward M.D.   On: 05/13/2016 16:56   Dg Lumbar Spine Complete Result Date: 05/13/2016 CLINICAL DATA:  Larey Seat at home last week, pain to LEFT leg and tailbone and low back, history Parkinson's disease, hypertension, COPD EXAM: LUMBAR SPINE - COMPLETE 4+ VIEW COMPARISON:  None ; correlation chest radiographs 03/13/2016 FINDINGS: Diffuse osseous demineralization. Five non-rib-bearing lumbar vertebra. Biconvex thoracolumbar scoliosis. Multilevel disc space narrowing and endplate spur formation. Compression fracture T12 vertebral body with approximately 40-50% anterior height loss, not significantly changed. Remaining vertebral body heights maintained. No acute fracture, subluxation or bone destruction. No spondylolysis. SI joints preserved. Minimal atherosclerotic calcification aorta. IMPRESSION: Superior endplate compression fracture of T12 with 40-50% anterior height loss not significantly changed since 03/13/2016. Severe osseous demineralization with scattered degenerative disc and facet disease changes lumbar spine and associated biconvex thoracolumbar scoliosis. No definite acute fracture identified. Aortic atherosclerosis. Electronically Signed   By: Ulyses Southward M.D.   On: 05/13/2016 16:10   Ct Head Wo Contrast Result Date: 05/13/2016 CLINICAL DATA:  Larey Seat at home last week EXAM: CT HEAD WITHOUT CONTRAST CT CERVICAL SPINE WITHOUT CONTRAST TECHNIQUE: Multidetector CT imaging of the head and cervical spine was performed following the standard protocol without intravenous contrast. Multiplanar CT image reconstructions of the cervical spine were also generated. COMPARISON:  08/21/2015 FINDINGS: CT HEAD FINDINGS Brain: No intracranial hemorrhage, mass effect or midline shift. No acute cortical infarction. Stable mild cerebral atrophy. Stable periventricular chronic white matter disease. No definite acute cortical infarction. No mass lesion is noted on this unenhanced scan. Vascular: Atherosclerotic  calcifications of carotid siphon again noted. Skull: No skull fracture is noted. Sinuses/Orbits: No acute findings Other: None CT CERVICAL SPINE FINDINGS Alignment: The alignment is preserved. Skull base and vertebrae: Again noted degenerative changes C1-C2 articulation. Mild endplate osteophytes at C5-C6 at C6-C7 level. Mild anterior endplate osteophytes at C3-C4 level. Minimal anterior spurring lower endplate of C4 vertebral body. Soft tissues and spinal canal: No prevertebral soft tissue swelling. Cervical airway is patent. There is mild spinal canal stenosis due to posterior spurring and minimal disc bulge at C6-C7 level. Disc levels: There is minimal posterior disc space flattening with mild posterior spurring at cysts tree C4 level. Moderate disc space flattening is noted at C5-C6 and C6-C7 level. Upper chest: No pneumothorax in visualized lung apices. Other: None IMPRESSION: 1. No acute intracranial abnormality. Stable atrophy and chronic white matter disease. 2. No cervical  spine acute fracture or subluxation. Multilevel degenerative changes as described above. No prevertebral soft tissue swelling. Cervical airway is patent. Electronically Signed   By: Natasha Mead M.D.   On: 05/13/2016 16:16   Ct Cervical Spine Wo Contrast Result Date: 05/13/2016 CLINICAL DATA:  Larey Seat at home last week EXAM: CT HEAD WITHOUT CONTRAST CT CERVICAL SPINE WITHOUT CONTRAST TECHNIQUE: Multidetector CT imaging of the head and cervical spine was performed following the standard protocol without intravenous contrast. Multiplanar CT image reconstructions of the cervical spine were also generated. COMPARISON:  08/21/2015 FINDINGS: CT HEAD FINDINGS Brain: No intracranial hemorrhage, mass effect or midline shift. No acute cortical infarction. Stable mild cerebral atrophy. Stable periventricular chronic white matter disease. No definite acute cortical infarction. No mass lesion is noted on this unenhanced scan. Vascular: Atherosclerotic  calcifications of carotid siphon again noted. Skull: No skull fracture is noted. Sinuses/Orbits: No acute findings Other: None CT CERVICAL SPINE FINDINGS Alignment: The alignment is preserved. Skull base and vertebrae: Again noted degenerative changes C1-C2 articulation. Mild endplate osteophytes at C5-C6 at C6-C7 level. Mild anterior endplate osteophytes at C3-C4 level. Minimal anterior spurring lower endplate of C4 vertebral body. Soft tissues and spinal canal: No prevertebral soft tissue swelling. Cervical airway is patent. There is mild spinal canal stenosis due to posterior spurring and minimal disc bulge at C6-C7 level. Disc levels: There is minimal posterior disc space flattening with mild posterior spurring at cysts tree C4 level. Moderate disc space flattening is noted at C5-C6 and C6-C7 level. Upper chest: No pneumothorax in visualized lung apices. Other: None IMPRESSION: 1. No acute intracranial abnormality. Stable atrophy and chronic white matter disease. 2. No cervical spine acute fracture or subluxation. Multilevel degenerative changes as described above. No prevertebral soft tissue swelling. Cervical airway is patent. Electronically Signed   By: Natasha Mead M.D.   On: 05/13/2016 16:16   Dg Hips Bilat W Or Wo Pelvis 3-4 Views Result Date: 05/13/2016 CLINICAL DATA:  Larey Seat at home last week, pain to LEFT leg, tailbone and low back, history Parkinson's disease, hypertension, COPD EXAM: DG HIP (WITH OR WITHOUT PELVIS) 3-4V BILAT COMPARISON:  None FINDINGS: Diffuse osseous demineralization. Hip and SI joint spaces preserved. Degenerative disc and facet disease changes at visualized lower lumbar spine. No acute fracture, dislocation, or bone destruction. Scattered atherosclerotic calcifications. IMPRESSION: No acute osseous abnormalities. Osseous demineralization with degenerative changes at visualized lower lumbar spine. Electronically Signed   By: Ulyses Southward M.D.   On: 05/13/2016 16:12    1705:  XR/CT  reassuring. Tx symptomatically, f/u PMD. Dx and testing d/w pt and family.  Questions answered.  Verb understanding, agreeable to d/c home with outpt f/u.   Final Clinical Impressions(s) / ED Diagnoses   Final diagnoses:  None    New Prescriptions New Prescriptions   No medications on file     Samuel Jester, DO 05/16/16 1339

## 2016-10-13 ENCOUNTER — Ambulatory Visit (INDEPENDENT_AMBULATORY_CARE_PROVIDER_SITE_OTHER): Payer: Medicare (Managed Care)

## 2016-10-13 DIAGNOSIS — R42 Dizziness and giddiness: Secondary | ICD-10-CM | POA: Diagnosis not present

## 2016-11-15 IMAGING — DX DG CHEST 2V
2 series · 2 of 2 positions shown · non-contrast
Comparison: August 18, 2014.

CLINICAL DATA: Hypertension, chills.

EXAM:
CHEST  2 VIEW

[chest pa]
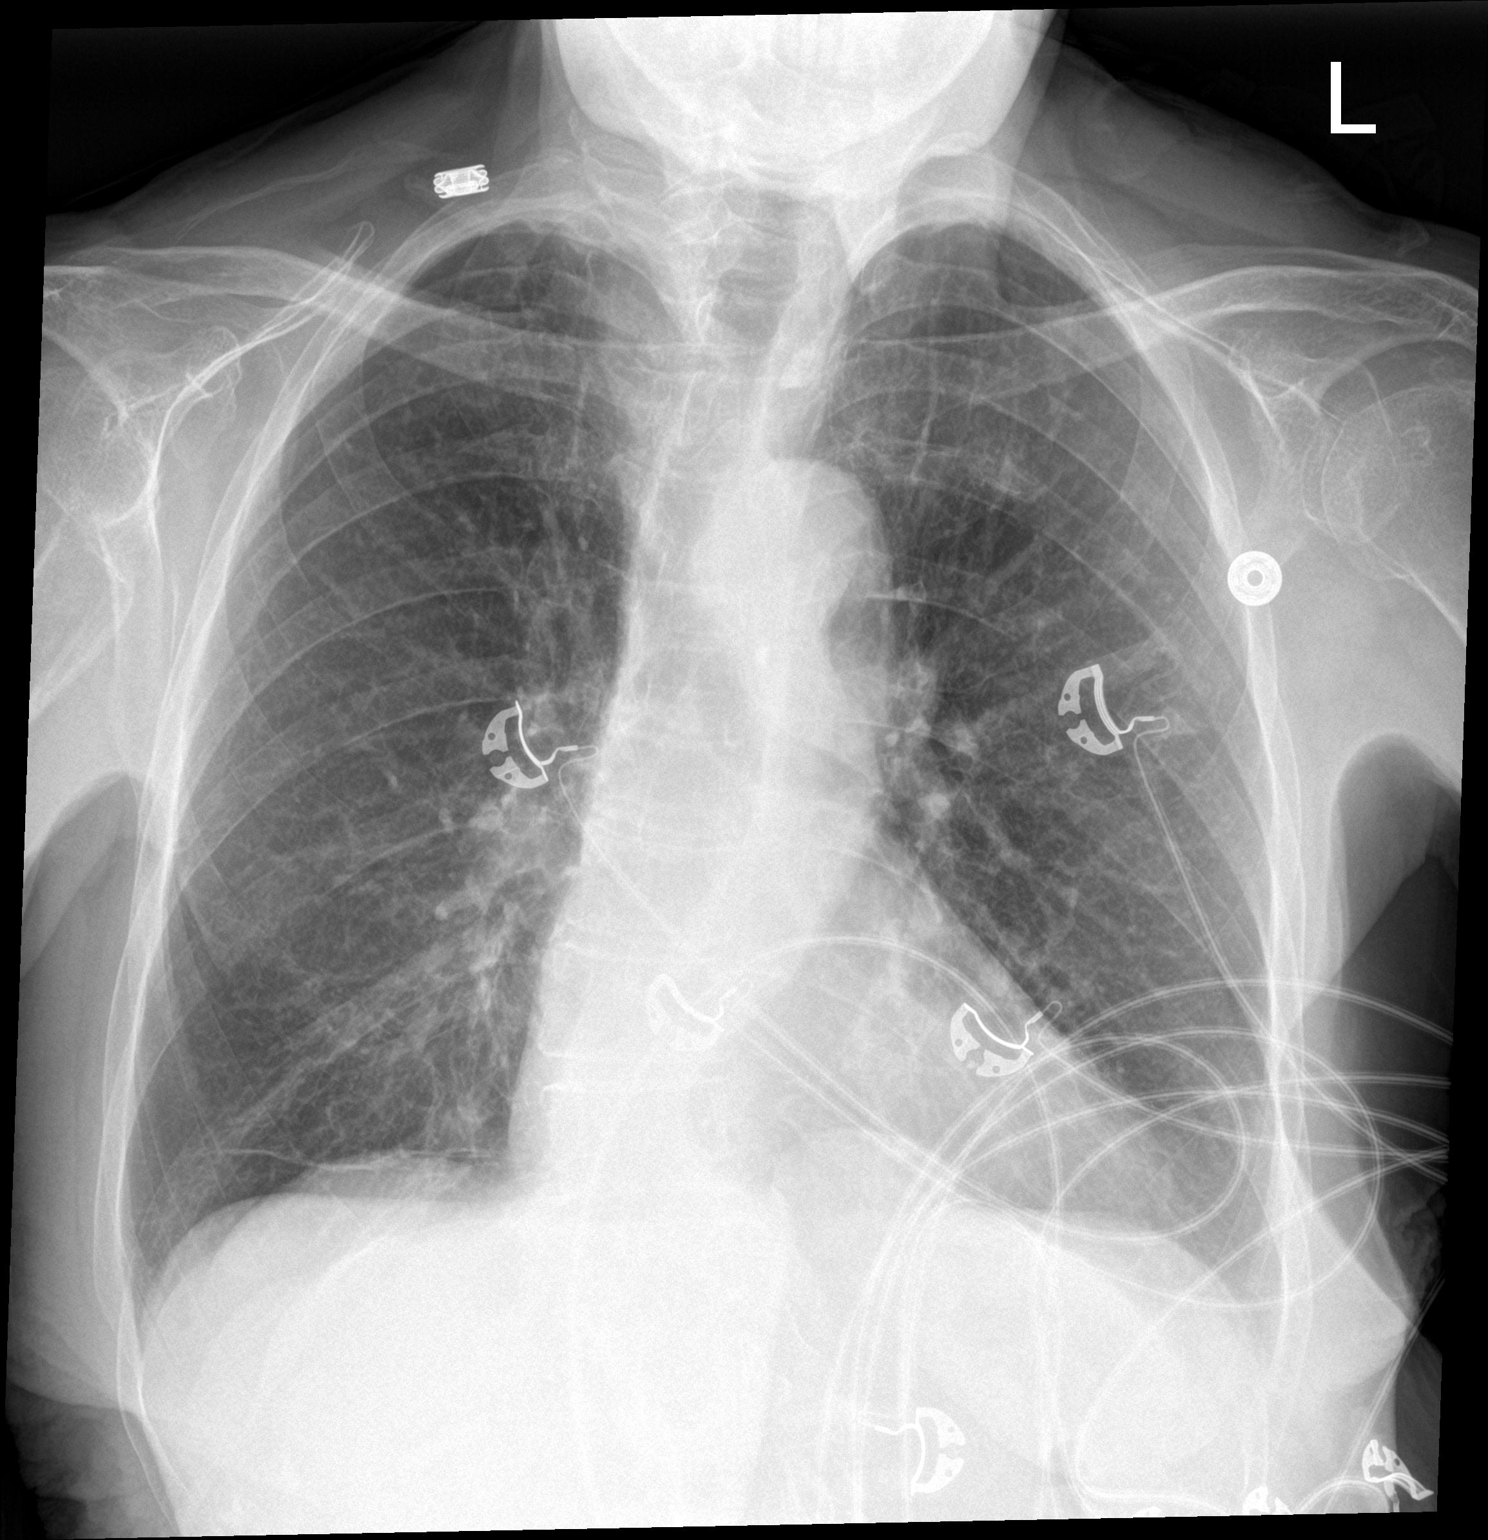

[chest lat]
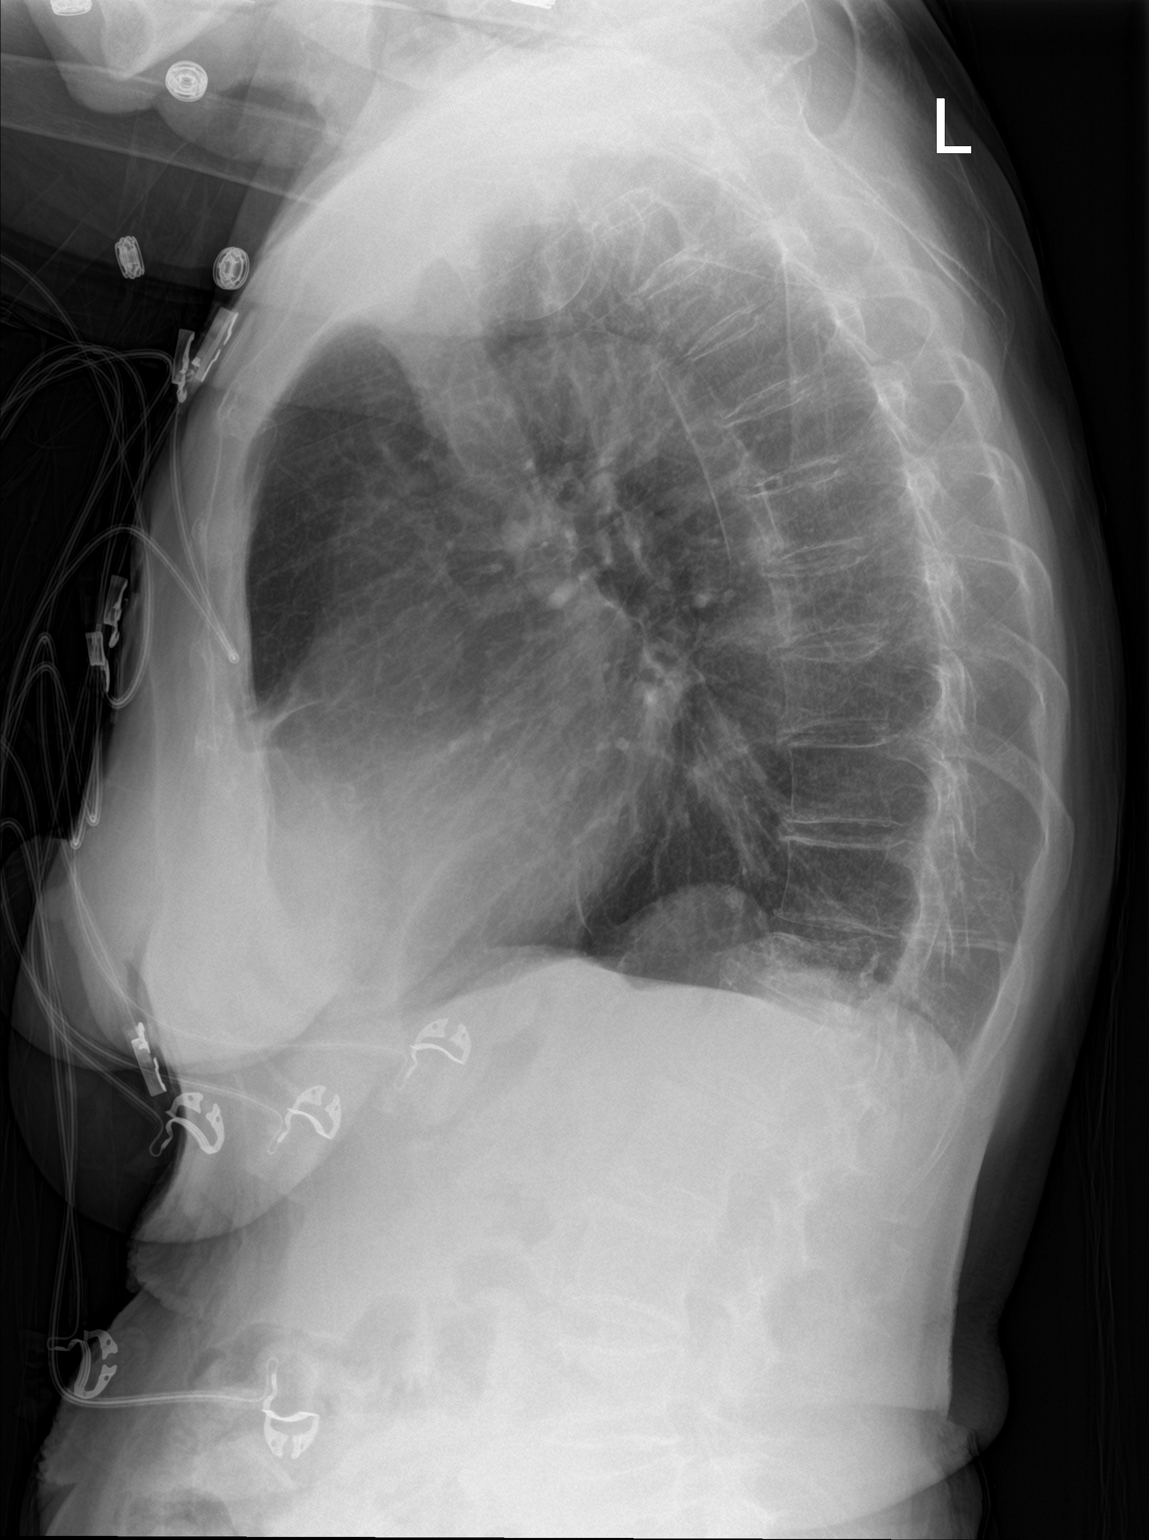

[2 of 2 positions shown; findings below may reference images not displayed]

FINDINGS: Stable compression deformity of T12 vertebral body is noted
consistent with old fracture. No pneumothorax or significant pleural
effusion is noted. Stable cardiomediastinal silhouette. No acute
pulmonary disease is noted.
IMPRESSION: No active cardiopulmonary disease.

## 2016-12-07 ENCOUNTER — Other Ambulatory Visit: Payer: Self-pay | Admitting: Nurse Practitioner

## 2016-12-07 ENCOUNTER — Ambulatory Visit
Admission: RE | Admit: 2016-12-07 | Discharge: 2016-12-07 | Disposition: A | Discharge status: Home / Self Care | Payer: Medicare (Managed Care) | Source: Ambulatory Visit | Attending: Nurse Practitioner | Admitting: Nurse Practitioner

## 2016-12-07 ENCOUNTER — Other Ambulatory Visit: Payer: Self-pay | Admitting: Family Medicine

## 2016-12-07 DIAGNOSIS — W19XXXA Unspecified fall, initial encounter: Secondary | ICD-10-CM

## 2016-12-07 DIAGNOSIS — M25512 Pain in left shoulder: Secondary | ICD-10-CM

## 2016-12-08 ENCOUNTER — Ambulatory Visit (INDEPENDENT_AMBULATORY_CARE_PROVIDER_SITE_OTHER): Payer: PRIVATE HEALTH INSURANCE | Admitting: Orthopaedic Surgery

## 2016-12-08 ENCOUNTER — Encounter (INDEPENDENT_AMBULATORY_CARE_PROVIDER_SITE_OTHER): Payer: Self-pay | Admitting: Orthopaedic Surgery

## 2016-12-08 ENCOUNTER — Ambulatory Visit
Admission: RE | Admit: 2016-12-08 | Discharge: 2016-12-08 | Disposition: A | Discharge status: Home / Self Care | Payer: Medicare (Managed Care) | Source: Ambulatory Visit | Attending: Family Medicine | Admitting: Family Medicine

## 2016-12-08 DIAGNOSIS — S42255A Nondisplaced fracture of greater tuberosity of left humerus, initial encounter for closed fracture: Secondary | ICD-10-CM | POA: Diagnosis not present

## 2016-12-08 DIAGNOSIS — M25512 Pain in left shoulder: Secondary | ICD-10-CM

## 2016-12-08 NOTE — Progress Notes (Signed)
Office Visit Note   Patient: Sandra Davis           Date of Birth: 1928-07-19           MRN: [id number] Visit Date: 12/08/2016              Requested by: Estevan Oaks, NP [id number] [place], [place] [address] PCP: Estevan Oaks, NP   Assessment & Plan: Visit Diagnoses:  1. Traumatic closed nondisp fracture of greater tuberosity of humerus, left, initial encounter     Plan: X-ray and MRI reviewed shows nondisplaced greater tuberosity fracture. This should be amenable to nonoperative treatment. Sling for 2 weeks and then wean as tolerated. Follow-up in 4 weeks for recheck in 2 view x-rays of the left shoulder.  May begin gentle range of motion at 2 weeks.  Follow-Up Instructions: Return in about 4 weeks (around 01/05/2017).   Orders:  No orders of the defined types were placed in this encounter.  No orders of the defined types were placed in this encounter.     Procedures: No procedures performed   Clinical Data: No additional findings.   Subjective: Chief Complaint  Patient presents with  . Left Shoulder - Pain    Patient is a 83 year old female who had a mechanical fall this past Sunday while going to the bathroom. She landed directly on her left shoulder. X-rays and MRI showed a nondisplaced greater tuberosity fracture. Her pain is mild to moderate. She walks with a walker. Denies any numbness and tingling. Pain is worse with use of the arm.    Review of Systems  Constitutional: Negative.   HENT: Negative.   Eyes: Negative.   Respiratory: Negative.   Cardiovascular: Negative.   Endocrine: Negative.   Musculoskeletal: Negative.   Neurological: Negative.   Hematological: Negative.   Psychiatric/Behavioral: Negative.   All other systems reviewed and are negative.    Objective: Vital Signs: There were no vitals taken for this visit.  Physical Exam  Constitutional: She is oriented to person, place, and time. She appears well-developed and  well-nourished.  HENT:  Head: Normocephalic and atraumatic.  Eyes: EOM are normal.  Neck: Neck supple.  Pulmonary/Chest: Effort normal.  Abdominal: Soft.  Neurological: She is alert and oriented to person, place, and time.  Skin: Skin is warm. Capillary refill takes less than 2 seconds.  Psychiatric: She has a normal mood and affect. Her behavior is normal. Judgment and thought content normal.  Nursing note and vitals reviewed.   Ortho Exam Left shoulder exam shows tenderness over the greater tuberosity. Range of motion is slightly better than expected secondary to the fracture. She is able to actively elevate her arm without any significant pain. Specialty Comments:  No specialty comments available.  Imaging: Mr Shoulder Left Wo Contrast  Result Date: 12/08/2016 CLINICAL DATA:  Left shoulder pain after fall. EXAM: MRI OF THE LEFT SHOULDER WITHOUT CONTRAST TECHNIQUE: Multiplanar, multisequence MR imaging of the shoulder was performed. No intravenous contrast was administered. COMPARISON:  Left shoulder x-rays dated December 07, 2016. FINDINGS: Despite efforts by the technologist and patient, motion artifact is present on today's exam and could not be eliminated. This reduces exam sensitivity and specificity. Rotator cuff: There is a large full-thickness, near full width tear of the distal supraspinatus tendon. A few far anterior posterior fibers likely remain intact. The tear measures approximately 20 mm in AP dimension. There is mild thickening and increased intermediate signal of the infraspinatus and subscapularis tendons without  discrete tear. The teres minor tendon is intact. Muscles:  Mild supraspinatus muscle fatty atrophy. Biceps long head: Tendinosis and partial tearing of the intra-articular portion. No subluxation. Acromioclavicular Joint: Mild arthropathy of the acromioclavicular joint. Type II acromion. Small amount of fluid in the subacromial/subdeltoid bursa. Glenohumeral Joint:  Small joint effusion. Diffuse thinning of the glenohumeral cartilage. Labrum: Circumferential labral degeneration with tearing of the superior labrum. Bones: Small amount of marrow edema in the posterior greater tuberosity. No dislocation. Other: None. IMPRESSION: 1. Large full-thickness, near full width tear of the distal supraspinatus tendon few far anterior and posterior fibers likely remain intact. Mild supraspinatus muscle atrophy. 2. Subscapularis and infraspinatus tendinosis. 3. Tendinosis and partial tearing of the intra-articular biceps tendon. 4. Superior labral tear. 5. Small amount of marrow edema in the posterior greater tuberosity, which could reflect contusion versus a small nondisplaced impaction fracture. Evaluation is limited due to motion artifact. Electronically Signed   By: Obie Dredge M.D.   On: 12/08/2016 14:00     PMFS History: Patient Active Problem List   Diagnosis Date Noted  . Traumatic closed nondisp fracture of greater tuberosity of humerus, left, initial encounter 12/08/2016  . Dizziness 10/13/2016   Past Medical History:  Diagnosis Date  . Arthritis    osteoporosis  . Back pain   . COPD (chronic obstructive pulmonary disease) (HCC)   . Gait instability   . Glaucoma   . Glaucoma   . Hypertension   . Neck pain   . Parkinson's disease (HCC)   . Tremor     Family History  Problem Relation Age of Onset  . Heart failure Mother     Past Surgical History:  Procedure Laterality Date  . CATARACT EXTRACTION    . EYE SURGERY    . TONSILLECTOMY    . TUBAL LIGATION     Social History   Occupational History  . Not on file.   Social History Main Topics  . Smoking status: Never Smoker  . Smokeless tobacco: Never Used  . Alcohol use No  . Drug use: No  . Sexual activity: Not on file

## 2016-12-22 ENCOUNTER — Other Ambulatory Visit: Payer: Self-pay | Admitting: Internal Medicine

## 2016-12-22 ENCOUNTER — Ambulatory Visit
Admission: RE | Admit: 2016-12-22 | Discharge: 2016-12-22 | Disposition: A | Discharge status: Home / Self Care | Payer: Medicare (Managed Care) | Source: Ambulatory Visit | Attending: Internal Medicine | Admitting: Internal Medicine

## 2016-12-22 DIAGNOSIS — R52 Pain, unspecified: Secondary | ICD-10-CM

## 2017-01-07 ENCOUNTER — Ambulatory Visit (INDEPENDENT_AMBULATORY_CARE_PROVIDER_SITE_OTHER): Payer: PRIVATE HEALTH INSURANCE

## 2017-01-07 ENCOUNTER — Ambulatory Visit (INDEPENDENT_AMBULATORY_CARE_PROVIDER_SITE_OTHER): Payer: PRIVATE HEALTH INSURANCE | Admitting: Orthopaedic Surgery

## 2017-01-07 ENCOUNTER — Encounter (INDEPENDENT_AMBULATORY_CARE_PROVIDER_SITE_OTHER): Payer: Self-pay | Admitting: Orthopaedic Surgery

## 2017-01-07 DIAGNOSIS — S42255A Nondisplaced fracture of greater tuberosity of left humerus, initial encounter for closed fracture: Secondary | ICD-10-CM

## 2017-01-07 NOTE — Progress Notes (Signed)
Patient is 4 weeks status post left nondisplaced greater tuberosity fracture diagnosed on MRI. She is feeling much better today. She mainly complains of some mild pain and some mild weakness.  Physical exam shows improving range of motion of the shoulder both actively and passively. She her greater tuberosity is nontender. X-rays show stable appearance of the fracture without any displacement.  At this point she may increase activity and weight-bear as tolerated to the left upper extremity. She needs to work with physical therapy and OT for strengthening, range of motion, ADLs. Follow-up with me as needed.

## 2017-02-08 ENCOUNTER — Other Ambulatory Visit: Payer: Self-pay | Admitting: Internal Medicine

## 2017-02-08 ENCOUNTER — Ambulatory Visit
Admission: RE | Admit: 2017-02-08 | Discharge: 2017-02-08 | Disposition: A | Discharge status: Home / Self Care | Payer: No Typology Code available for payment source | Source: Ambulatory Visit | Attending: Internal Medicine | Admitting: Internal Medicine

## 2017-02-08 DIAGNOSIS — R1032 Left lower quadrant pain: Secondary | ICD-10-CM

## 2017-02-26 ENCOUNTER — Other Ambulatory Visit: Payer: Self-pay | Admitting: Nurse Practitioner

## 2017-02-26 ENCOUNTER — Ambulatory Visit
Admission: RE | Admit: 2017-02-26 | Discharge: 2017-02-26 | Disposition: A | Discharge status: Home / Self Care | Payer: Medicare (Managed Care) | Source: Ambulatory Visit | Attending: Nurse Practitioner | Admitting: Nurse Practitioner

## 2017-02-26 DIAGNOSIS — W19XXXA Unspecified fall, initial encounter: Secondary | ICD-10-CM

## 2017-02-28 ENCOUNTER — Other Ambulatory Visit: Payer: Self-pay

## 2017-02-28 ENCOUNTER — Encounter (HOSPITAL_COMMUNITY): Payer: Self-pay | Admitting: Emergency Medicine

## 2017-02-28 ENCOUNTER — Emergency Department (HOSPITAL_COMMUNITY)
Admission: EM | Admit: 2017-02-28 | Discharge: 2017-02-28 | Disposition: A | Discharge status: Home / Self Care | Payer: Medicare (Managed Care) | Attending: Emergency Medicine | Admitting: Emergency Medicine

## 2017-02-28 ENCOUNTER — Emergency Department (HOSPITAL_COMMUNITY): Payer: Medicare (Managed Care)

## 2017-02-28 DIAGNOSIS — W06XXXA Fall from bed, initial encounter: Secondary | ICD-10-CM | POA: Diagnosis not present

## 2017-02-28 DIAGNOSIS — S40012A Contusion of left shoulder, initial encounter: Secondary | ICD-10-CM | POA: Diagnosis not present

## 2017-02-28 DIAGNOSIS — Y929 Unspecified place or not applicable: Secondary | ICD-10-CM | POA: Diagnosis not present

## 2017-02-28 DIAGNOSIS — I10 Essential (primary) hypertension: Secondary | ICD-10-CM | POA: Insufficient documentation

## 2017-02-28 DIAGNOSIS — S0101XA Laceration without foreign body of scalp, initial encounter: Secondary | ICD-10-CM | POA: Diagnosis not present

## 2017-02-28 DIAGNOSIS — Z7982 Long term (current) use of aspirin: Secondary | ICD-10-CM | POA: Diagnosis not present

## 2017-02-28 DIAGNOSIS — Z79899 Other long term (current) drug therapy: Secondary | ICD-10-CM | POA: Insufficient documentation

## 2017-02-28 DIAGNOSIS — G20 Parkinson's disease: Secondary | ICD-10-CM | POA: Insufficient documentation

## 2017-02-28 DIAGNOSIS — S20212A Contusion of left front wall of thorax, initial encounter: Secondary | ICD-10-CM | POA: Diagnosis not present

## 2017-02-28 DIAGNOSIS — Y999 Unspecified external cause status: Secondary | ICD-10-CM | POA: Insufficient documentation

## 2017-02-28 DIAGNOSIS — Y9389 Activity, other specified: Secondary | ICD-10-CM | POA: Insufficient documentation

## 2017-02-28 DIAGNOSIS — S0990XA Unspecified injury of head, initial encounter: Secondary | ICD-10-CM | POA: Diagnosis present

## 2017-02-28 DIAGNOSIS — W19XXXA Unspecified fall, initial encounter: Secondary | ICD-10-CM

## 2017-02-28 MED ORDER — LIDOCAINE-EPINEPHRINE (PF) 2 %-1:200000 IJ SOLN
10.0000 mL | Freq: Once | INTRAMUSCULAR | Status: DC
  Filled 2017-02-28: qty 20

## 2017-02-28 MED ORDER — POVIDONE-IODINE 10 % EX SOLN
CUTANEOUS | Status: AC
  Filled 2017-02-28: qty 15

## 2017-02-28 NOTE — ED Provider Notes (Signed)
[facility] EMERGENCY DEPARTMENT Provider Note   CSN: [id number] Arrival date & time: 02/28/17  [id number]     History   Chief Complaint Chief Complaint  Patient presents with  . Fall    HPI Sandra Davis is a 83 y.o. female.  Pt presents to the ED today with fall.  Pt has ambulatory dysfunction and has had many falls.  The pt fell out of bed today hitting her head on the bedside table.  Pt sustained a lac to her head.  No loc.  She also c/o left shoulder and rib pain.  The pt has back pain, but that is not new.  Pt is not on blood thinners.  Pt took her pain meds pta and does not want any more medication for pain.  Pt does not want a ct of her head.  She said she's had too many.      Past Medical History:  Diagnosis Date  . Arthritis    osteoporosis  . Back pain   . COPD (chronic obstructive pulmonary disease) (HCC)   . Gait instability   . Glaucoma   . Glaucoma   . Hypertension   . Neck pain   . Parkinson's disease (HCC)   . Tremor     Patient Active Problem List   Diagnosis Date Noted  . Traumatic closed nondisp fracture of greater tuberosity of humerus, left, initial encounter 12/08/2016  . Dizziness 10/13/2016    Past Surgical History:  Procedure Laterality Date  . CATARACT EXTRACTION    . EYE SURGERY    . TONSILLECTOMY    . TUBAL LIGATION      OB History    Gravida Para Term Preterm AB Living   5 5 5          SAB TAB Ectopic Multiple Live Births                   Home Medications    Prior to Admission medications   Medication Sig Start Date End Date Taking? Authorizing Provider  albuterol (PROVENTIL HFA;VENTOLIN HFA) 108 (90 Base) MCG/ACT inhaler Inhale 1-2 puffs into the lungs every 6 (six) hours as needed for wheezing or shortness of breath.    [provider]  amLODipine (NORVASC) 5 MG tablet Take 5 mg by mouth daily.     [provider]  aspirin EC 325 MG tablet Take 325 mg by mouth daily.    [provider]    atenolol (TENORMIN) 50 MG tablet Take 50 mg by mouth daily.     [provider]  budesonide-formoterol (SYMBICORT) 160-4.5 MCG/ACT inhaler Inhale 2 puffs into the lungs 2 (two) times daily.    [provider]  calcium carbonate (OSCAL) 1500 (600 Ca) MG TABS tablet Take 600 mg of elemental calcium by mouth 2 (two) times daily with a meal.     [provider]  CALCIUM-MAGNESIUM-ZINC PO Take 1 tablet by mouth daily.     [provider]  carboxymethylcellulose (REFRESH PLUS) 0.5 % SOLN Place 1 drop into both eyes as needed (for dry eyes).    [provider]  Cholecalciferol (VITAMIN D3) 5000 UNITS CAPS Take 5,000 Units by mouth daily.     [provider]  diclofenac (VOLTAREN) 75 MG EC tablet Take 75 mg by mouth 2 (two) times daily.    [provider]  Garlic 1000 MG CAPS Take 1,000 mg by mouth daily.    [provider]  HYDROcodone-acetaminophen (NORCO/VICODIN) 5-325 MG  tablet 1 tab PO q12 hours prn pain 05/13/16   Samuel Jester, DO  Multiple Vitamin (MULTIVITAMIN WITH MINERALS) TABS tablet Take 1 tablet by mouth daily.    [provider]  omega-3 acid ethyl esters (LOVAZA) 1 g capsule Take 1 g by mouth daily.    [provider]  omeprazole (PRILOSEC) 40 MG capsule Take 40 mg by mouth daily as needed (for acid reflux).     [provider]  timolol (BETIMOL) 0.5 % ophthalmic solution Place 1 drop into both eyes 2 (two) times daily.    [provider]  valsartan (DIOVAN) 320 MG tablet Take 320 mg by mouth every evening.     [provider]  vitamin C (ASCORBIC ACID) 500 MG tablet Take 500 mg by mouth daily.    [provider]  vitamin E 400 UNIT capsule Take 400 Units by mouth daily.    [provider]    Family History Family History  Problem Relation Age of Onset  . Heart failure Mother     Social History Social History   Tobacco Use  . Smoking status:  Never Smoker  . Smokeless tobacco: Never Used  Substance Use Topics  . Alcohol use: No  . Drug use: No     Allergies   Hydrochlorothiazide; Lisinopril; Other; and Spironolactone   Review of Systems Review of Systems  Musculoskeletal:       Left shoulder, left rib pain  Skin: Positive for wound.  All other systems reviewed and are negative.    Physical Exam Updated Vital Signs BP (!) 141/85 (BP Location: Right Arm)   Pulse 66   Resp 16   Wt 43.1 kg (95 lb)   SpO2 99%   BMI 18.55 kg/m   Physical Exam  Constitutional: She is oriented to person, place, and time.  Pt is elderly and very frail in appearance.  HENT:  Head: Normocephalic.    Right Ear: External ear normal.  Left Ear: External ear normal.  Nose: Nose normal.  Mouth/Throat: Oropharynx is clear and moist.  Eyes: Conjunctivae and EOM are normal. Pupils are equal, round, and reactive to light.  Neck: Normal range of motion. Neck supple.  Cardiovascular: Normal rate, regular rhythm, normal heart sounds and intact distal pulses.  Pulmonary/Chest: Effort normal and breath sounds normal. She exhibits tenderness.  Abdominal: Soft. Bowel sounds are normal.  Musculoskeletal:       Left shoulder: She exhibits tenderness.  Neurological: She is alert and oriented to person, place, and time.  Skin: Skin is warm and dry. Capillary refill takes less than 2 seconds.  Psychiatric: She has a normal mood and affect. Her behavior is normal. Judgment and thought content normal.  Nursing note and vitals reviewed.    ED Treatments / Results  Labs (all labs ordered are listed, but only abnormal results are displayed) Labs Reviewed - No data to display  EKG  EKG Interpretation None       Radiology Dg Ribs Unilateral W/chest Left  Result Date: 02/28/2017 CLINICAL DATA:  Larey Seat in the early am today, hit head on bed side table and fell, pain left arm and left lateral ribs, husband she had hurt her left shoulder before  EXAM: LEFT RIBS AND CHEST - 3+ VIEW COMPARISON:  02/26/2017 FINDINGS: No fracture or other bone lesions are seen involving the ribs. There is no evidence of pneumothorax or pleural effusion. Both lungs are clear. Heart size mildly enlarged, stable. Tortuous atheromatous aorta. Posttraumatic/ degenerative  changes in the right shoulder. IMPRESSION: 1. Negative for displaced rib fracture or pneumothorax. 2. Stable mild cardiomegaly. Electronically Signed   By: Corlis Leak  Hassell M.D.   On: 02/28/2017 11:07   Dg Ribs Unilateral W/chest Right  Result Date: 02/26/2017 CLINICAL DATA:  Fall 3 days ago with right-sided chest pain, initial encounter EXAM: RIGHT RIBS AND CHEST - 3+ VIEW COMPARISON:  12/22/2016 FINDINGS: Chronic changes of proximal right humerus are noted. No acute rib fracture is noted. The lungs are clear bilaterally. Cardiac shadow is mildly enlarged. No sizable effusion is seen. IMPRESSION: No evidence of acute rib fracture.  No acute abnormality noted. Electronically Signed   By: Alcide CleverMark  Lukens M.D.   On: 02/26/2017 14:14   Dg Shoulder Left  Result Date: 02/28/2017 CLINICAL DATA:  Fall.  Pain. EXAM: LEFT SHOULDER - 2+ VIEW COMPARISON:  None. FINDINGS: There is no evidence of fracture or dislocation. There is no evidence of arthropathy or other focal bone abnormality. Soft tissues are unremarkable. IMPRESSION: Negative. Electronically Signed   By: Kennith CenterEric  Mansell M.D.   On: 02/28/2017 11:06    Procedures .Marland Kitchen.Laceration Repair Date/Time: 02/28/2017 11:25 AM Performed by: Jacalyn LefevreHaviland, Lucyann Romano, MD Authorized by: Jacalyn LefevreHaviland, Rakisha Pincock, MD   Consent:    Consent obtained:  Verbal   Consent given by:  Patient   Risks discussed:  Pain   Alternatives discussed:  No treatment Anesthesia (see MAR for exact dosages):    Anesthesia method:  Local infiltration   Local anesthetic:  Lidocaine 2% WITH epi Laceration details:    Location:  Scalp   Length (cm):  3   Depth (mm):  1 Repair type:    Repair type:   Simple Exploration:    Hemostasis achieved with:  Epinephrine   Contaminated: no   Treatment:    Area cleansed with:  Saline   Amount of cleaning:  Standard   Irrigation solution:  Sterile saline Skin repair:    Repair method:  Staples   Number of staples:  4 Approximation:    Approximation:  Close   Vermilion border: well-aligned   Post-procedure details:    Dressing:  Antibiotic ointment and non-adherent dressing   Patient tolerance of procedure:  Tolerated well, no immediate complications   (including critical care time)  Medications Ordered in ED Medications  lidocaine-EPINEPHrine (XYLOCAINE W/EPI) 2 %-1:200000 (PF) injection 10 mL (not administered)  povidone-iodine (BETADINE) 10 % external solution (not administered)     Initial Impression / Assessment and Plan / ED Course  I have reviewed the triage vital signs and the nursing notes.  Pertinent labs & imaging results that were available during my care of the patient were reviewed by me and considered in my medical decision making (see chart for details).    Pt's xrays show no fracture.  She does not want a CT head.  She is awake and alert.  No loc.  No blood thinners.  I think that is reasonable.  Pt is stable for d/c.   Final Clinical Impressions(s) / ED Diagnoses   Final diagnoses:  Fall, initial encounter  Laceration of scalp, initial encounter  Contusion of left shoulder, initial encounter  Rib contusion, left, initial encounter    ED Discharge Orders    None       Jacalyn LefevreHaviland, Kerron Sedano, MD 02/28/17 1127

## 2017-02-28 NOTE — ED Triage Notes (Signed)
Larey SeatFell out of bed hitting head on edge of nightstand. Pt has hematoma to left side of head. Also complaining of left arm pain. Denies LOC.

## 2017-04-02 ENCOUNTER — Ambulatory Visit
Admission: RE | Admit: 2017-04-02 | Discharge: 2017-04-02 | Disposition: A | Discharge status: Home / Self Care | Payer: Medicare (Managed Care) | Source: Ambulatory Visit | Attending: Nurse Practitioner | Admitting: Nurse Practitioner

## 2017-04-02 ENCOUNTER — Other Ambulatory Visit: Payer: Self-pay | Admitting: Nurse Practitioner

## 2017-04-02 DIAGNOSIS — W19XXXA Unspecified fall, initial encounter: Secondary | ICD-10-CM

## 2017-04-22 ENCOUNTER — Encounter (HOSPITAL_COMMUNITY): Payer: Self-pay

## 2017-04-22 ENCOUNTER — Emergency Department (HOSPITAL_COMMUNITY): Payer: Medicare (Managed Care)

## 2017-04-22 ENCOUNTER — Inpatient Hospital Stay (HOSPITAL_COMMUNITY)
Admission: EM | Admit: 2017-04-22 | Discharge: 2017-04-23 | DRG: 535 | Disposition: A | Discharge status: Skilled Nursing Facility | Payer: Medicare (Managed Care) | Attending: Internal Medicine | Admitting: Internal Medicine

## 2017-04-22 DIAGNOSIS — S5001XA Contusion of right elbow, initial encounter: Secondary | ICD-10-CM | POA: Diagnosis present

## 2017-04-22 DIAGNOSIS — J449 Chronic obstructive pulmonary disease, unspecified: Secondary | ICD-10-CM | POA: Diagnosis present

## 2017-04-22 DIAGNOSIS — Z79899 Other long term (current) drug therapy: Secondary | ICD-10-CM | POA: Diagnosis not present

## 2017-04-22 DIAGNOSIS — H409 Unspecified glaucoma: Secondary | ICD-10-CM | POA: Diagnosis present

## 2017-04-22 DIAGNOSIS — M810 Age-related osteoporosis without current pathological fracture: Secondary | ICD-10-CM | POA: Diagnosis present

## 2017-04-22 DIAGNOSIS — S60222A Contusion of left hand, initial encounter: Secondary | ICD-10-CM | POA: Diagnosis present

## 2017-04-22 DIAGNOSIS — M546 Pain in thoracic spine: Secondary | ICD-10-CM

## 2017-04-22 DIAGNOSIS — I10 Essential (primary) hypertension: Secondary | ICD-10-CM

## 2017-04-22 DIAGNOSIS — S32592D Other specified fracture of left pubis, subsequent encounter for fracture with routine healing: Secondary | ICD-10-CM | POA: Diagnosis not present

## 2017-04-22 DIAGNOSIS — W1830XA Fall on same level, unspecified, initial encounter: Secondary | ICD-10-CM | POA: Diagnosis present

## 2017-04-22 DIAGNOSIS — R296 Repeated falls: Secondary | ICD-10-CM | POA: Diagnosis present

## 2017-04-22 DIAGNOSIS — Y92009 Unspecified place in unspecified non-institutional (private) residence as the place of occurrence of the external cause: Secondary | ICD-10-CM

## 2017-04-22 DIAGNOSIS — Z8249 Family history of ischemic heart disease and other diseases of the circulatory system: Secondary | ICD-10-CM

## 2017-04-22 DIAGNOSIS — G20 Parkinson's disease: Secondary | ICD-10-CM | POA: Diagnosis present

## 2017-04-22 DIAGNOSIS — R29810 Facial weakness: Secondary | ICD-10-CM | POA: Diagnosis present

## 2017-04-22 DIAGNOSIS — K219 Gastro-esophageal reflux disease without esophagitis: Secondary | ICD-10-CM | POA: Diagnosis present

## 2017-04-22 DIAGNOSIS — J189 Pneumonia, unspecified organism: Secondary | ICD-10-CM | POA: Diagnosis present

## 2017-04-22 DIAGNOSIS — Z888 Allergy status to other drugs, medicaments and biological substances status: Secondary | ICD-10-CM

## 2017-04-22 DIAGNOSIS — S32592A Other specified fracture of left pubis, initial encounter for closed fracture: Principal | ICD-10-CM | POA: Diagnosis present

## 2017-04-22 DIAGNOSIS — M1990 Unspecified osteoarthritis, unspecified site: Secondary | ICD-10-CM | POA: Diagnosis present

## 2017-04-22 DIAGNOSIS — S329XXA Fracture of unspecified parts of lumbosacral spine and pelvis, initial encounter for closed fracture: Secondary | ICD-10-CM | POA: Diagnosis present

## 2017-04-22 DIAGNOSIS — Z79891 Long term (current) use of opiate analgesic: Secondary | ICD-10-CM

## 2017-04-22 DIAGNOSIS — S0990XA Unspecified injury of head, initial encounter: Secondary | ICD-10-CM | POA: Diagnosis present

## 2017-04-22 DIAGNOSIS — S20212A Contusion of left front wall of thorax, initial encounter: Secondary | ICD-10-CM | POA: Diagnosis present

## 2017-04-22 DIAGNOSIS — Z7951 Long term (current) use of inhaled steroids: Secondary | ICD-10-CM

## 2017-04-22 DIAGNOSIS — F0390 Unspecified dementia without behavioral disturbance: Secondary | ICD-10-CM | POA: Diagnosis present

## 2017-04-22 DIAGNOSIS — S32591D Other specified fracture of right pubis, subsequent encounter for fracture with routine healing: Secondary | ICD-10-CM | POA: Diagnosis not present

## 2017-04-22 DIAGNOSIS — S32591A Other specified fracture of right pubis, initial encounter for closed fracture: Secondary | ICD-10-CM | POA: Diagnosis present

## 2017-04-22 DIAGNOSIS — W19XXXA Unspecified fall, initial encounter: Secondary | ICD-10-CM

## 2017-04-22 DIAGNOSIS — G20A1 Parkinson's disease without dyskinesia, without mention of fluctuations: Secondary | ICD-10-CM

## 2017-04-22 DIAGNOSIS — Z66 Do not resuscitate: Secondary | ICD-10-CM | POA: Diagnosis present

## 2017-04-22 DIAGNOSIS — M79642 Pain in left hand: Secondary | ICD-10-CM

## 2017-04-22 DIAGNOSIS — Z9181 History of falling: Secondary | ICD-10-CM

## 2017-04-22 LAB — BASIC METABOLIC PANEL
ANION GAP: 10 (ref 5–15)
BUN: 17 mg/dL (ref 6–20)
CHLORIDE: 100 mmol/L — AB (ref 101–111)
CO2: 22 mmol/L (ref 22–32)
Calcium: 8.7 mg/dL — ABNORMAL LOW (ref 8.9–10.3)
Creatinine, Ser: 0.58 mg/dL (ref 0.44–1.00)
GFR calc non Af Amer: >60 mL/min (ref >60)
Glucose, Bld: 117 mg/dL — ABNORMAL HIGH (ref 65–99)
POTASSIUM: 4.4 mmol/L (ref 3.5–5.1)
SODIUM: 132 mmol/L — AB (ref 135–145)

## 2017-04-22 LAB — I-STAT CHEM 8, ED
BUN: 16 mg/dL (ref 6–20)
CHLORIDE: 100 mmol/L — AB (ref 101–111)
Calcium, Ion: 1.12 mmol/L — ABNORMAL LOW (ref 1.15–1.40)
Creatinine, Ser: 0.6 mg/dL (ref 0.44–1.00)
GLUCOSE: 117 mg/dL — AB (ref 65–99)
HCT: 38 % (ref 36.0–46.0)
Hemoglobin: 12.9 g/dL (ref 12.0–15.0)
POTASSIUM: 4.5 mmol/L (ref 3.5–5.1)
SODIUM: 136 mmol/L (ref 135–145)
TCO2: 25 mmol/L (ref 22–32)

## 2017-04-22 LAB — CBC WITH DIFFERENTIAL/PLATELET
Basophils Absolute: 0.1 10*3/uL (ref 0.0–0.1)
Basophils Relative: 1 %
Eosinophils Absolute: 0.5 10*3/uL (ref 0.0–0.7)
Eosinophils Relative: 6 %
HCT: 36.5 % (ref 36.0–46.0)
HEMOGLOBIN: 11.9 g/dL — AB (ref 12.0–15.0)
LYMPHS PCT: 17 %
Lymphs Abs: 1.3 10*3/uL (ref 0.7–4.0)
MCH: 31.9 pg (ref 26.0–34.0)
MCHC: 32.6 g/dL (ref 30.0–36.0)
MCV: 97.9 fL (ref 78.0–100.0)
Monocytes Absolute: 1 10*3/uL (ref 0.1–1.0)
Monocytes Relative: 13 %
NEUTROS PCT: 63 %
Neutro Abs: 4.7 10*3/uL (ref 1.7–7.7)
Platelets: 292 10*3/uL (ref 150–400)
RBC: 3.73 MIL/uL — AB (ref 3.87–5.11)
RDW: 13.4 % (ref 11.5–15.5)
WBC: 7.5 10*3/uL (ref 4.0–10.5)

## 2017-04-22 LAB — PROTIME-INR
INR: 0.97
PROTHROMBIN TIME: 12.8 s (ref 11.4–15.2)

## 2017-04-22 MED ORDER — MORPHINE SULFATE (PF) 2 MG/ML IV SOLN
2.0000 mg | Freq: Once | INTRAVENOUS | Status: AC
  Administered 2017-04-22: 2 mg via INTRAVENOUS
  Filled 2017-04-22: qty 1

## 2017-04-22 MED ORDER — DEXTROSE 5 % IV SOLN
500.0000 mg | INTRAVENOUS | Status: DC
  Administered 2017-04-23: 500 mg via INTRAVENOUS
  Filled 2017-04-22 (×4): qty 500

## 2017-04-22 MED ORDER — IOPAMIDOL (ISOVUE-300) INJECTION 61%
50.0000 mL | Freq: Once | INTRAVENOUS | Status: AC | PRN
  Administered 2017-04-22: 50 mL via INTRAVENOUS

## 2017-04-22 MED ORDER — IPRATROPIUM-ALBUTEROL 0.5-2.5 (3) MG/3ML IN SOLN: 3.0000 mL | Freq: Four times a day (QID) | RESPIRATORY_TRACT | Status: DC | PRN

## 2017-04-22 MED ORDER — SODIUM CHLORIDE 0.9 % IV SOLN
INTRAVENOUS | Status: AC
  Administered 2017-04-22: 23:00:00 via INTRAVENOUS

## 2017-04-22 MED ORDER — SODIUM CHLORIDE 0.9 % IV BOLUS (SEPSIS)
500.0000 mL | Freq: Once | INTRAVENOUS | Status: AC
  Administered 2017-04-22: 500 mL via INTRAVENOUS

## 2017-04-22 MED ORDER — ATENOLOL 25 MG PO TABS
50.0000 mg | ORAL_TABLET | Freq: Every day | ORAL | Status: DC
  Administered 2017-04-23: 50 mg via ORAL
  Filled 2017-04-22: qty 2

## 2017-04-22 MED ORDER — HYDROCODONE-ACETAMINOPHEN 5-325 MG PO TABS
1.0000 | ORAL_TABLET | Freq: Four times a day (QID) | ORAL | Status: DC
  Administered 2017-04-23 (×2): 1 via ORAL
  Filled 2017-04-22 (×2): qty 1

## 2017-04-22 MED ORDER — LORAZEPAM 2 MG/ML IJ SOLN
0.5000 mg | Freq: Once | INTRAMUSCULAR | Status: AC
  Administered 2017-04-22: 0.5 mg via INTRAVENOUS
  Filled 2017-04-22: qty 1

## 2017-04-22 MED ORDER — DEXTROSE 5 % IV SOLN
INTRAVENOUS | Status: AC
  Filled 2017-04-22: qty 500

## 2017-04-22 MED ORDER — DEXTROSE 5 % IV SOLN
1.0000 g | INTRAVENOUS | Status: DC
Start: 2017-04-22 — End: 2017-04-23
  Administered 2017-04-22: 1 g via INTRAVENOUS
  Filled 2017-04-22 (×5): qty 10

## 2017-04-22 MED ORDER — ASPIRIN EC 325 MG PO TBEC
325.0000 mg | DELAYED_RELEASE_TABLET | Freq: Every day | ORAL | Status: DC
  Administered 2017-04-23: 325 mg via ORAL
  Filled 2017-04-22: qty 1

## 2017-04-22 MED ORDER — TIMOLOL MALEATE 0.5 % OP SOLN
1.0000 [drp] | Freq: Two times a day (BID) | OPHTHALMIC | Status: DC
  Administered 2017-04-22 – 2017-04-23 (×2): 1 [drp] via OPHTHALMIC
  Filled 2017-04-22: qty 5

## 2017-04-22 MED ORDER — MOMETASONE FURO-FORMOTEROL FUM 200-5 MCG/ACT IN AERO
2.0000 | INHALATION_SPRAY | Freq: Two times a day (BID) | RESPIRATORY_TRACT | Status: DC
Start: 2017-04-22 — End: 2017-04-23
  Filled 2017-04-22: qty 8.8

## 2017-04-22 MED ORDER — PANTOPRAZOLE SODIUM 40 MG PO TBEC
40.0000 mg | DELAYED_RELEASE_TABLET | Freq: Every day | ORAL | Status: DC
  Administered 2017-04-23: 40 mg via ORAL
  Filled 2017-04-22: qty 1

## 2017-04-22 NOTE — ED Notes (Signed)
Husband-Ted  Can call for any questions. [phone]

## 2017-04-22 NOTE — ED Provider Notes (Signed)
Emergency Department Provider Note   I have reviewed the triage vital signs and the nursing notes.   HISTORY  Chief Complaint Fall   HPI Sandra Davis is a 84 y.o. female with PMH of COPD, Parkinson's disease, and dementia followed by [facility] presents to the emergency department for evaluation after fall.  According to EMS the patient was reaching into a cabinet when she fell backwards and struck the hardwood floor.  She is complaining of pain in the arms and lower back.  After discussion with [facility] patient is a history of multiple fractures of the spine, hips, and pelvis with many prior surgeries. Fragility reportedly due to osteopenia and age. Husband, we are told, is en route.   Level 5 caveat: Dementia    Past Medical History:  Diagnosis Date  . Arthritis    osteoporosis  . Back pain   . COPD (chronic obstructive pulmonary disease) (HCC)   . Gait instability   . Glaucoma   . Glaucoma   . Hypertension   . Neck pain   . Parkinson's disease (HCC)   . Tremor     Patient Active Problem List   Diagnosis Date Noted  . Traumatic closed nondisp fracture of greater tuberosity of humerus, left, initial encounter 12/08/2016  . Dizziness 10/13/2016    Past Surgical History:  Procedure Laterality Date  . CATARACT EXTRACTION    . EYE SURGERY    . TONSILLECTOMY    . TUBAL LIGATION      Current Outpatient Rx  . Order #: [id number] Class: Historical Med  . Order #: [id number] Class: Historical Med  . Order #: [id number] Class: Historical Med  . Order #: [id number] Class: Historical Med  . Order #: [id number] Class: Historical Med  . Order #: [id number] Class: Historical Med  . Order #: [id number] Class: Historical Med  . Order #: [id number] Class: Historical Med  . Order #: [id number] Class: Historical Med  . Order #: [id number] Class: Historical Med  . Order #: [id number] Class: Historical Med  . Order #: [id number] Class: Historical Med  . Order #: [id number] Class: Print  . Order  #: [id number] Class: Historical Med  . Order #: [id number] Class: Historical Med  . Order #: [id number] Class: Historical Med  . Order #: [id number] Class: Historical Med  . Order #: [id number] Class: Historical Med  . Order #: [id number] Class: Historical Med  . Order #: [id number] Class: Historical Med  . Order #: [id number] Class: Historical Med    Allergies Hydrochlorothiazide; Lisinopril; Other; and Spironolactone  Family History  Problem Relation Age of Onset  . Heart failure Mother     Social History Social History   Tobacco Use  . Smoking status: Never Smoker  . Smokeless tobacco: Never Used  Substance Use Topics  . Alcohol use: No  . Drug use: No    Review of Systems  Level 5 caveat: Dementia.   ____________________________________________   PHYSICAL EXAM:  VITAL SIGNS: ED Triage Vitals  Enc Vitals Group     BP --      Pulse Rate 04/22/17 1731 85     Resp --      Temp 04/22/17 1731 97.7 F (36.5 C)     Temp Source 04/22/17 1731 Oral     SpO2 --      Weight 04/22/17 1727 84 lb (38.1 kg)     Height 04/22/17 1727 5' (1.524 m)     Pain Score 04/22/17 1725 10   Constitutional: Alert. Chronically ill-appearing with mild tremor.  Eyes: Conjunctivae are normal. PERRL.  Head: Faint abrasion over  the frontal scalp. No hematomas or lacerations.  Nose: No congestion/rhinnorhea. Mouth/Throat: Mucous membranes are moist.   Neck: No stridor. No cervical spine tenderness to palpation. Cardiovascular: Normal rate, regular rhythm. Good peripheral circulation. Grossly normal heart sounds.   Respiratory: Normal respiratory effort.  No retractions. Lungs CTAB. Gastrointestinal: Soft and nontender. No distention.  Musculoskeletal: Large hematoma over the right elbow with some pain with passive ROM of the joint. No pain over the shoulder and wrist on the right. Some bruising and mild swelling over the left hand. Right hip is flexed with pain on passive ROM (normal pulses and  sensation). Diffuse pain over the thoracic and lumbar spine diffusely. No stepoffs or deformity.  Neurologic:  Normal speech and language. No gross focal neurologic deficits are appreciated.  Skin:  Skin is warm and dry. Some bruising over the left chest wall.    ____________________________________________   LABS (all labs ordered are listed, but only abnormal results are displayed)  Labs Reviewed  BASIC METABOLIC PANEL - Abnormal; Notable for the following components:      Result Value   Sodium 132 (*)    Chloride 100 (*)    Glucose, Bld 117 (*)    Calcium 8.7 (*)    All other components within normal limits  CBC WITH DIFFERENTIAL/PLATELET - Abnormal; Notable for the following components:   RBC 3.73 (*)    Hemoglobin 11.9 (*)    All other components within normal limits  I-STAT CHEM 8, ED - Abnormal; Notable for the following components:   Chloride 100 (*)    Glucose, Bld 117 (*)    Calcium, Ion 1.12 (*)    All other components within normal limits  PROTIME-INR   ____________________________________________  RADIOLOGY  Dg Chest 1 View  Result Date: 04/22/2017 CLINICAL DATA:  Larey Seat backwards onto floor. EXAM: CHEST 1 VIEW COMPARISON:  02/28/2017 FINDINGS: The cardio pericardial silhouette is enlarged. Interstitial markings are diffusely coarsened with chronic features. Clustered nodularity in the right apex is new in the interval. No edema or pleural effusion. Chronic posttraumatic deformity noted proximal right humerus. Telemetry leads overlie the chest. IMPRESSION: Cardiomegaly with hyperexpansion and chronic interstitial changes. Clustered nodularity in the right apex is new in the interval. This may be related to bronchopneumonia or atypical infection. CT chest without contrast could be used to further evaluate as clinically warranted. Electronically Signed   By: Kennith Center M.D.   On: 04/22/2017 19:06   Dg Elbow 2 Views Right  Result Date: 04/22/2017 CLINICAL DATA:  Fall  with bruising and swelling to right elbow. Initial encounter. EXAM: RIGHT ELBOW - 2 VIEW COMPARISON:  None. FINDINGS: There is no evidence of fracture, dislocation, or joint effusion. There is no evidence of arthropathy or other focal bone abnormality. Soft tissues are unremarkable. IMPRESSION: Negative. Electronically Signed   By: Irish Lack M.D.   On: 04/22/2017 19:14   Ct Head Wo Contrast  Result Date: 04/22/2017 CLINICAL DATA:  Status post fall backwards. EXAM: CT HEAD WITHOUT CONTRAST CT CERVICAL SPINE WITHOUT CONTRAST TECHNIQUE: Multidetector CT imaging of the head and cervical spine was performed following the standard protocol without intravenous contrast. Multiplanar CT image reconstructions of the cervical spine were also generated. COMPARISON:  05/13/2016 FINDINGS: CT HEAD FINDINGS Brain: No evidence of acute infarction, hemorrhage, hydrocephalus, extra-axial collection or mass lesion/mass effect. Moderate brain parenchymal atrophy and deep white matter microangiopathy. Vascular: Calcific atherosclerotic disease at the skull base. Skull: Normal. Negative for fracture or focal lesion. Sinuses/Orbits: No  acute finding. Other: None. CT CERVICAL SPINE FINDINGS Alignment: Straightening of cervical lordosis. Skull base and vertebrae: No acute fracture. No primary bone lesion or focal pathologic process. Soft tissues and spinal canal: No prevertebral fluid or swelling. No visible canal hematoma. Disc levels:  Multilevel osteoarthritic changes. Upper chest: Negative. Other: None. IMPRESSION: No acute intracranial abnormality. Atrophy, chronic microvascular disease. No evidence of acute traumatic injury to the cervical spine. Straightening of the cervical lordosis, a osteoarthritic changes. Electronically Signed   By: Ted Mcalpine M.D.   On: 04/22/2017 19:38   Ct Cervical Spine Wo Contrast  Result Date: 04/22/2017 CLINICAL DATA:  Status post fall backwards. EXAM: CT HEAD WITHOUT CONTRAST CT  CERVICAL SPINE WITHOUT CONTRAST TECHNIQUE: Multidetector CT imaging of the head and cervical spine was performed following the standard protocol without intravenous contrast. Multiplanar CT image reconstructions of the cervical spine were also generated. COMPARISON:  05/13/2016 FINDINGS: CT HEAD FINDINGS Brain: No evidence of acute infarction, hemorrhage, hydrocephalus, extra-axial collection or mass lesion/mass effect. Moderate brain parenchymal atrophy and deep white matter microangiopathy. Vascular: Calcific atherosclerotic disease at the skull base. Skull: Normal. Negative for fracture or focal lesion. Sinuses/Orbits: No acute finding. Other: None. CT CERVICAL SPINE FINDINGS Alignment: Straightening of cervical lordosis. Skull base and vertebrae: No acute fracture. No primary bone lesion or focal pathologic process. Soft tissues and spinal canal: No prevertebral fluid or swelling. No visible canal hematoma. Disc levels:  Multilevel osteoarthritic changes. Upper chest: Negative. Other: None. IMPRESSION: No acute intracranial abnormality. Atrophy, chronic microvascular disease. No evidence of acute traumatic injury to the cervical spine. Straightening of the cervical lordosis, a osteoarthritic changes. Electronically Signed   By: Ted Mcalpine M.D.   On: 04/22/2017 19:38   Ct Thoracic Spine Wo Contrast  Result Date: 04/22/2017 CLINICAL DATA:  Status post fall, back pain. EXAM: CT THORACIC and LUMBAR SPINE WITHOUT CONTRAST TECHNIQUE: Multidetector CT imaging of the thoracic and lumbar spine was performed without intravenous contrast. Multiplanar CT image reconstructions were also generated. COMPARISON:  Plain film of the lumbar spine dated 05/13/2016 FINDINGS: CT THORACIC SPINE FINDINGS Alignment: Prominent dextroscoliosis of the lower thoracic spine. No evidence of acute vertebral body subluxation. Vertebrae: No fracture line or displaced fracture fragment. Chronic compression fracture deformity of the  T12 vertebral body. Paraspinal and other soft tissues: Aortic atherosclerosis. Cholelithiasis. No acute findings within the visualized paravertebral soft tissues. Disc levels: Mild degenerative change within the lower thoracic spine. No more than mild central canal stenosis at any level. CT LUMBAR SPINE FINDINGS Segmentation: 5 lumbar type vertebrae. Alignment: Prominent levoscoliosis centered at the L3 vertebral body level. No evidence of acute vertebral body subluxation. Vertebrae: No fracture line or displaced fracture fragment identified. Upper sacrum appears intact and normally aligned. Paraspinal and other soft tissues: Aortic atherosclerosis. Cholelithiasis. No acute findings seen within the visualized paravertebral soft tissues. Disc levels: Disc desiccations throughout the mid and lower cervical spine, mild to moderate in degree, with associated mild to moderate central canal stenoses at multiple levels. IMPRESSION: 1. No evidence of acute fracture or acute subluxation within the thoracic or lumbar spine. 2. Prominent scoliosis of the thoracic and lumbar spine. 3. Chronic compression deformity of the T12 vertebral body. 4. Mild degenerative change within the thoracic spine. Mild to moderate degenerative change within the lumbar spine. 5. Aortic atherosclerosis. 6. Cholelithiasis. Electronically Signed   By: Bary Richard M.D.   On: 04/22/2017 20:10   Ct Lumbar Spine Wo Contrast  Result Date: 04/22/2017 CLINICAL DATA:  Status post fall, back pain. EXAM: CT THORACIC and LUMBAR SPINE WITHOUT CONTRAST TECHNIQUE: Multidetector CT imaging of the thoracic and lumbar spine was performed without intravenous contrast. Multiplanar CT image reconstructions were also generated. COMPARISON:  Plain film of the lumbar spine dated 05/13/2016 FINDINGS: CT THORACIC SPINE FINDINGS Alignment: Prominent dextroscoliosis of the lower thoracic spine. No evidence of acute vertebral body subluxation. Vertebrae: No fracture line  or displaced fracture fragment. Chronic compression fracture deformity of the T12 vertebral body. Paraspinal and other soft tissues: Aortic atherosclerosis. Cholelithiasis. No acute findings within the visualized paravertebral soft tissues. Disc levels: Mild degenerative change within the lower thoracic spine. No more than mild central canal stenosis at any level. CT LUMBAR SPINE FINDINGS Segmentation: 5 lumbar type vertebrae. Alignment: Prominent levoscoliosis centered at the L3 vertebral body level. No evidence of acute vertebral body subluxation. Vertebrae: No fracture line or displaced fracture fragment identified. Upper sacrum appears intact and normally aligned. Paraspinal and other soft tissues: Aortic atherosclerosis. Cholelithiasis. No acute findings seen within the visualized paravertebral soft tissues. Disc levels: Disc desiccations throughout the mid and lower cervical spine, mild to moderate in degree, with associated mild to moderate central canal stenoses at multiple levels. IMPRESSION: 1. No evidence of acute fracture or acute subluxation within the thoracic or lumbar spine. 2. Prominent scoliosis of the thoracic and lumbar spine. 3. Chronic compression deformity of the T12 vertebral body. 4. Mild degenerative change within the thoracic spine. Mild to moderate degenerative change within the lumbar spine. 5. Aortic atherosclerosis. 6. Cholelithiasis. Electronically Signed   By: Bary Richard M.D.   On: 04/22/2017 20:10   Ct Abdomen Pelvis W Contrast  Result Date: 04/22/2017 CLINICAL DATA:  Fall with pelvic pain and bilateral pubic rami fractures by x-ray. EXAM: CT ABDOMEN AND PELVIS WITH CONTRAST TECHNIQUE: Multidetector CT imaging of the abdomen and pelvis was performed using the standard protocol following bolus administration of intravenous contrast. CONTRAST:  50mL ISOVUE-300 IOPAMIDOL (ISOVUE-300) INJECTION 61% COMPARISON:  Pelvic and bilateral hip x-rays earlier today. Lumbar spine  radiographs on 05/13/2016 FINDINGS: Lower chest: Mild scarring at both lung bases. 5 mm calcified granuloma at the left lung base. Hepatobiliary: No hepatic injury or perihepatic hematoma. No patent masses identified. There are some layering calcified gallstones within the gallbladder lumen. No biliary ductal dilatation. Pancreas: Unremarkable. No pancreatic ductal dilatation or surrounding inflammatory changes. Spleen: No splenic injury or perisplenic hematoma. Adrenals/Urinary Tract: Adrenal glands are unremarkable. Kidneys are normal, without renal calculi, focal lesion, or hydronephrosis. No evidence of renal injury. Bladder is distended and without visible gross injury. Stomach/Bowel: Bowel shows no evidence of obstruction, inflammation or perforation. Vascular/Lymphatic: No significant vascular findings are present. No enlarged abdominal or pelvic lymph nodes. Reproductive: Uterus and bilateral adnexa are unremarkable. Other: No free fluid, hemoperitoneum or hernia. Musculoskeletal: Bilateral superior and inferior pubic rami fractures are present demonstrating mild displacement. No diastasis at the level of the pubic symphysis. No hip fracture or other pelvic fracture identified. There is some associated extraperitoneal hemorrhage adjacent to the pubic rami fractures. Compression deformity of the T12 vertebral body was present on prior lumbar radiographs from 2018 and appears similar with approximately 40-50% loss of vertebral body height. IMPRESSION: 1. Bilateral superior and inferior pubic rami fractures with some associated surrounding extraperitoneal hemorrhage. No evidence of gross bladder injury or other pelvic fractures. 2. Old compression fracture of the T12 vertebral body appears similar to lumbar radiographs on 05/13/2016 Electronically Signed   By: Rudene Anda.D.  On: 04/22/2017 19:42   Dg Hand 2 View Left  Result Date: 04/22/2017 CLINICAL DATA:  Fall with bruising left hand.  Initial  encounter. EXAM: LEFT HAND - 2 VIEW COMPARISON:  None. FINDINGS: No visible fracture or dislocation. Soft tissues are unremarkable. No bony lesions. IMPRESSION: No acute fracture identified. Electronically Signed   By: Irish LackGlenn  Yamagata M.D.   On: 04/22/2017 19:15   Dg Hips Bilat W Or Wo Pelvis 2 Views  Result Date: 04/22/2017 CLINICAL DATA:  Fall with bilateral hip pain.  Initial encounter. EXAM: DG HIP (WITH OR WITHOUT PELVIS) 2V BILAT COMPARISON:  05/13/2016 FINDINGS: There are bilateral superior and inferior pubic rami fractures. The right-sided fractures show slightly more displacement compared to left-sided fractures. No overt diastasis of the pubic symphysis. Both hips show normal alignment and no evidence of acute fracture. IMPRESSION: Bilateral superior and inferior pubic rami fractures with slightly more displacement of right-sided fractures compared to the left. Electronically Signed   By: Irish LackGlenn  Yamagata M.D.   On: 04/22/2017 19:10    ____________________________________________   PROCEDURES  Procedure(s) performed:   Procedures  None ____________________________________________   INITIAL IMPRESSION / ASSESSMENT AND PLAN / ED COURSE  Pertinent labs & imaging results that were available during my care of the patient were reviewed by me and considered in my medical decision making (see chart for details).  Patient presents to the emergency department for evaluation after fall at home.  She has multiple areas of possible injury and by report sustained multiple fractures in various areas in the past.  Husband is apparently on the way and was present during the fall.  Suspect mechanical fall but will confirm this with husband's history.   07:11 PM Added CT abdomen/pelvis with pelvic fractures noted on plain film. Question if these are new/old.   07:34 PM Husband now at bedside. Confirms DNR status. Updated on pelvic fractures and he reports that she has no known history of similar  fractures. CT imaging pending.   08:30 PM Spoke with Dr. Romeo AppleHarrison with Ortho regarding the pelvic fractures and surgery recommendations. Bleeding is extraperitoneal. With ground-level fall and patient with dementia and DNR status would not offer surgery. Would recommend PT/OT and pain control. Patient can WBAT per Dr. Romeo AppleHarrison. Discussed this with the patient and husband who understands the injury and treatment plan. Will discuss admission with the hospitalist team.   Discussed patient's case with Hospitalist, Dr. Vanice SarahEmpokpae to request admission. Patient and family (if present) updated with plan. Care transferred to Hospitalist service.  I reviewed all nursing notes, vitals, pertinent old records, EKGs, labs, imaging (as available).  ____________________________________________  FINAL CLINICAL IMPRESSION(S) / ED DIAGNOSES  Final diagnoses:  Fall, initial encounter  Closed bilateral fracture of pubic rami, initial encounter (HCC)  Injury of head, initial encounter  Acute midline thoracic back pain  Traumatic hematoma of right elbow, initial encounter  Left hand pain     MEDICATIONS GIVEN DURING THIS VISIT:  Medications  morphine 2 MG/ML injection 2 mg (2 mg Intravenous Given 04/22/17 1805)  iopamidol (ISOVUE-300) 61 % injection 50 mL (50 mLs Intravenous Contrast Given 04/22/17 1909)  sodium chloride 0.9 % bolus 500 mL (500 mLs Intravenous New Bag/Given 04/22/17 1951)  morphine 2 MG/ML injection 2 mg (2 mg Intravenous Given 04/22/17 1951)    Note:  This document was prepared using Dragon voice recognition software and may include unintentional dictation errors.  Alona BeneJoshua Mikhaela Zaugg, MD Emergency Medicine    Brocha Gilliam, Arlyss RepressJoshua G, MD 04/22/17 [phone]

## 2017-04-22 NOTE — ED Triage Notes (Signed)
Pt was reaching into a cabinet and fell backwards onto a hard wood floor.  C/O pain to coccyx.

## 2017-04-22 NOTE — H&P (Signed)
History and Physical    Sandra Davis WUX:[id number] DOB: 08-14-1928 DOA: 04/22/2017  PCP: Estevan Oaks, NP   Patient coming from: home  Chief Complaint: Fall  HPI: Sandra Davis is a 84 y.o. female with medical history significant for HTN, Parkinsons, glaucoma, COPD, dementia, multiple fracture hx. Patient answering most of my questions appropriately, but cannot give details.  Patient cannot remember her fall, but is able to tell me she is having pain in her right elbow and pelvic region. Patient was brought to the ED via EMS with reports that patient was standing on the floor , reaching into a cabinet, and fell backwards onto hardwood floor.  Patient denies chest pain, no difficulty of breathing or cough.  She tells me she feels hot-temperature recheck 97.7 , but cannot tell me she has had fevers.  ED Course: O2 sats 90% on room air but otherwise stable vitals.  Sodium mildly low at 132 otherwise unremarkable, WBC 7.5.  Due to dementia status patient had several imaging studies on admission,  included bilateral superior and inferior pubic rami fractures, subsequent abdominal CT, confirmed pubic fracture and showed some extraperitoneal hemorrhage right elbow x-ray, left hand x-ray negative for fracture, head CT and cervical CT-no acute abnormality.  [facility] with [facility]- history of multiple fractures of the spine, hips, and pelvis with many prior surgeries. Fragility reportedly due to osteopenia and age. Orthopedist on call Dr. Romeo Apple was consulted,- recs- With ground-level fall and patient with dementia and DNR status would not offer surgery. Would recommend PT/OT and pain control. Patient can WBAT per Dr. Romeo Apple.   Review of Systems: No accessed due to dementia..   Past Medical History:  Diagnosis Date  . Arthritis    osteoporosis  . Back pain   . COPD (chronic obstructive pulmonary disease) (HCC)   . Gait instability   . Glaucoma   . Glaucoma   . Hypertension   . Neck pain     . Parkinson's disease (HCC)   . Tremor     Past Surgical History:  Procedure Laterality Date  . CATARACT EXTRACTION    . EYE SURGERY    . TONSILLECTOMY    . TUBAL LIGATION       reports that  has never smoked. she has never used smokeless tobacco. She reports that she does not drink alcohol or use drugs.  Allergies  Allergen Reactions  . Hydrochlorothiazide Other (See Comments)    Reaction:  Dizziness   . Lisinopril Cough  . Other Other (See Comments)    Pt states that she is allergic to multiple BP meds -- does not recall the names.   Reaction:  Dizziness   . Spironolactone Other (See Comments)    Reaction:  Blurred vision and sweating of feet     Family History  Problem Relation Age of Onset  . Heart failure Mother     Prior to Admission medications   Medication Sig Start Date End Date Taking? Authorizing Provider  nitroGLYCERIN (NITROSTAT) 0.4 MG SL tablet Place 1 tablet under the tongue as directed. Place 1 tablet under the tongue every five minutes as needed for emergency chest pain 11/16/12  Yes [provider]  albuterol (PROVENTIL HFA;VENTOLIN HFA) 108 (90 Base) MCG/ACT inhaler Inhale 1-2 puffs into the lungs every 6 (six) hours as needed for wheezing or shortness of breath.    [provider]  amLODipine (NORVASC) 5 MG tablet Take 5 mg by mouth daily.     [provider]  aspirin EC 325 MG tablet Take 325 mg by mouth daily.    [provider]  atenolol (TENORMIN) 50 MG tablet Take 50 mg by mouth daily.     [provider]  budesonide-formoterol (SYMBICORT) 160-4.5 MCG/ACT inhaler Inhale 2 puffs into the lungs 2 (two) times daily.    [provider]  calcium carbonate (OSCAL) 1500 (600 Ca) MG TABS tablet Take 600 mg of elemental calcium by mouth 2 (two) times daily with a meal.     [provider]  CALCIUM-MAGNESIUM-ZINC PO Take 1 tablet by mouth daily.     [provider]  carboxymethylcellulose  (REFRESH PLUS) 0.5 % SOLN Place 1 drop into both eyes as needed (for dry eyes).    [provider]  Cholecalciferol (VITAMIN D3) 5000 UNITS CAPS Take 5,000 Units by mouth daily.     [provider]  diclofenac (VOLTAREN) 75 MG EC tablet Take 75 mg by mouth 2 (two) times daily.    [provider]  Garlic 1000 MG CAPS Take 1,000 mg by mouth daily.    [provider]  HYDROcodone-acetaminophen (NORCO/VICODIN) 5-325 MG tablet 1 tab PO q12 hours prn pain 05/13/16   Samuel Jester, DO  Multiple Vitamin (MULTI-VITAMINS) TABS Take 1 tablet by mouth daily.    [provider]  Multiple Vitamin (MULTIVITAMIN WITH MINERALS) TABS tablet Take 1 tablet by mouth daily.    [provider]  omega-3 acid ethyl esters (LOVAZA) 1 g capsule Take 1 g by mouth daily.    [provider]  omeprazole (PRILOSEC) 40 MG capsule Take 40 mg by mouth daily as needed (for acid reflux).     [provider]  timolol (BETIMOL) 0.5 % ophthalmic solution Place 1 drop into both eyes 2 (two) times daily.    [provider]  valsartan (DIOVAN) 320 MG tablet Take 320 mg by mouth every evening.     [provider]  vitamin C (ASCORBIC ACID) 500 MG tablet Take 500 mg by mouth daily.    [provider]  vitamin E 400 UNIT capsule Take 400 Units by mouth daily.    [provider]    Physical Exam: Vitals:   04/22/17 1727 04/22/17 1731 04/22/17 2015 04/22/17 2030  BP:   115/61 105/82  Pulse:  85 72 66  Resp:   16 18  Temp:  97.7 F (36.5 C)    TempSrc:  Oral    SpO2:   90% 93%  Weight: 38.1 kg (84 lb)     Height: 5' (1.524 m)       Constitutional: calm, comfortable, awake, slightly drowsy, frail appearing elderly pleasant lady Vitals:   04/22/17 1727 04/22/17 1731 04/22/17 2015 04/22/17 2030  BP:   115/61 105/82  Pulse:  85 72 66  Resp:   16 18  Temp:  97.7 F (36.5 C)    TempSrc:  Oral    SpO2:   90% 93%  Weight:  38.1 kg (84 lb)     Height: 5' (1.524 m)      Eyes: PERRL, lids and conjunctivae normal ENMT: Slight droop left side of her mouth patient states this is old, mucous membranes are dry. Posterior pharynx clear of any exudate or lesions.Normal dentition.  Neck: normal, supple, no masses, no thyromegaly Respiratory: Anterior auscultation only, considering pelvic fracture , clear to auscultation bilaterally, no wheezing, no crackles. Normal respiratory effort. No accessory muscle use.  Cardiovascular: Regular rate and rhythm, no  murmurs / rubs / gallops. No extremity edema. 2+ pedal pulses. No carotid bruits.  Abdomen: Suprapubic area tender, no masses palpated. No hepatosplenomegaly. Bowel sounds positive.  Musculoskeletal: Large subcutaneous hematoma just inferior to right elbow, no cyanosis. Good ROM, no contractures. Normal muscle tone.  Skin: no rashes, lesions, ulcers. No induration Neurologic: Slight droop left side of mouth-old per pt, moving upper extremities, and lower, limited by pain. Psychiatric: Oriented to person and place.  Labs on Admission: I have personally reviewed following labs and imaging studies  CBC: Recent Labs  Lab 04/22/17 1816 04/22/17 1934  WBC  --  7.5  NEUTROABS  --  4.7  HGB 12.9 11.9*  HCT 38.0 36.5  MCV  --  97.9  PLT  --  292   Basic Metabolic Panel: Recent Labs  Lab 04/22/17 1816 04/22/17 1934  NA 136 132*  K 4.5 4.4  CL 100* 100*  CO2  --  22  GLUCOSE 117* 117*  BUN 16 17  CREATININE 0.60 0.58  CALCIUM  --  8.7*   Coagulation Profile: Recent Labs  Lab 04/22/17 1934  INR 0.97   Urine analysis:    Component Value Date/Time   COLORURINE YELLOW 03/13/2016 1521   APPEARANCEUR CLEAR 03/13/2016 1521   LABSPEC 1.010 03/13/2016 1521   PHURINE 7.0 03/13/2016 1521   GLUCOSEU NEGATIVE 03/13/2016 1521   HGBUR NEGATIVE 03/13/2016 1521   BILIRUBINUR NEGATIVE 03/13/2016 1521   KETONESUR NEGATIVE 03/13/2016 1521   PROTEINUR NEGATIVE  03/13/2016 1521   UROBILINOGEN 0.2 11/20/2014 2023   NITRITE NEGATIVE 03/13/2016 1521   LEUKOCYTESUR TRACE (A) 03/13/2016 1521    Radiological Exams on Admission: Dg Chest 1 View  Result Date: 04/22/2017 CLINICAL DATA:  Larey Seat backwards onto floor. EXAM: CHEST 1 VIEW COMPARISON:  02/28/2017 FINDINGS: The cardio pericardial silhouette is enlarged. Interstitial markings are diffusely coarsened with chronic features. Clustered nodularity in the right apex is new in the interval. No edema or pleural effusion. Chronic posttraumatic deformity noted proximal right humerus. Telemetry leads overlie the chest. IMPRESSION: Cardiomegaly with hyperexpansion and chronic interstitial changes. Clustered nodularity in the right apex is new in the interval. This may be related to bronchopneumonia or atypical infection. CT chest without contrast could be used to further evaluate as clinically warranted. Electronically Signed   By: Kennith Center M.D.   On: 04/22/2017 19:06   Dg Elbow 2 Views Right  Result Date: 04/22/2017 CLINICAL DATA:  Fall with bruising and swelling to right elbow. Initial encounter. EXAM: RIGHT ELBOW - 2 VIEW COMPARISON:  None. FINDINGS: There is no evidence of fracture, dislocation, or joint effusion. There is no evidence of arthropathy or other focal bone abnormality. Soft tissues are unremarkable. IMPRESSION: Negative. Electronically Signed   By: Irish Lack M.D.   On: 04/22/2017 19:14   Ct Head Wo Contrast  Result Date: 04/22/2017 CLINICAL DATA:  Status post fall backwards. EXAM: CT HEAD WITHOUT CONTRAST CT CERVICAL SPINE WITHOUT CONTRAST TECHNIQUE: Multidetector CT imaging of the head and cervical spine was performed following the standard protocol without intravenous contrast. Multiplanar CT image reconstructions of the cervical spine were also generated. COMPARISON:  05/13/2016 FINDINGS: CT HEAD FINDINGS Brain: No evidence of acute infarction, hemorrhage, hydrocephalus, extra-axial  collection or mass lesion/mass effect. Moderate brain parenchymal atrophy and deep white matter microangiopathy. Vascular: Calcific atherosclerotic disease at the skull base. Skull: Normal. Negative for fracture or focal lesion. Sinuses/Orbits: No acute finding. Other: None. CT CERVICAL SPINE FINDINGS Alignment: Straightening of cervical lordosis. Skull  base and vertebrae: No acute fracture. No primary bone lesion or focal pathologic process. Soft tissues and spinal canal: No prevertebral fluid or swelling. No visible canal hematoma. Disc levels:  Multilevel osteoarthritic changes. Upper chest: Negative. Other: None. IMPRESSION: No acute intracranial abnormality. Atrophy, chronic microvascular disease. No evidence of acute traumatic injury to the cervical spine. Straightening of the cervical lordosis, a osteoarthritic changes. Electronically Signed   By: Ted Mcalpineobrinka  Dimitrova M.D.   On: 04/22/2017 19:38   Ct Cervical Spine Wo Contrast  Result Date: 04/22/2017 CLINICAL DATA:  Status post fall backwards. EXAM: CT HEAD WITHOUT CONTRAST CT CERVICAL SPINE WITHOUT CONTRAST TECHNIQUE: Multidetector CT imaging of the head and cervical spine was performed following the standard protocol without intravenous contrast. Multiplanar CT image reconstructions of the cervical spine were also generated. COMPARISON:  05/13/2016 FINDINGS: CT HEAD FINDINGS Brain: No evidence of acute infarction, hemorrhage, hydrocephalus, extra-axial collection or mass lesion/mass effect. Moderate brain parenchymal atrophy and deep white matter microangiopathy. Vascular: Calcific atherosclerotic disease at the skull base. Skull: Normal. Negative for fracture or focal lesion. Sinuses/Orbits: No acute finding. Other: None. CT CERVICAL SPINE FINDINGS Alignment: Straightening of cervical lordosis. Skull base and vertebrae: No acute fracture. No primary bone lesion or focal pathologic process. Soft tissues and spinal canal: No prevertebral fluid or  swelling. No visible canal hematoma. Disc levels:  Multilevel osteoarthritic changes. Upper chest: Negative. Other: None. IMPRESSION: No acute intracranial abnormality. Atrophy, chronic microvascular disease. No evidence of acute traumatic injury to the cervical spine. Straightening of the cervical lordosis, a osteoarthritic changes. Electronically Signed   By: Ted Mcalpineobrinka  Dimitrova M.D.   On: 04/22/2017 19:38   Ct Thoracic Spine Wo Contrast  Result Date: 04/22/2017 CLINICAL DATA:  Status post fall, back pain. EXAM: CT THORACIC and LUMBAR SPINE WITHOUT CONTRAST TECHNIQUE: Multidetector CT imaging of the thoracic and lumbar spine was performed without intravenous contrast. Multiplanar CT image reconstructions were also generated. COMPARISON:  Plain film of the lumbar spine dated 05/13/2016 FINDINGS: CT THORACIC SPINE FINDINGS Alignment: Prominent dextroscoliosis of the lower thoracic spine. No evidence of acute vertebral body subluxation. Vertebrae: No fracture line or displaced fracture fragment. Chronic compression fracture deformity of the T12 vertebral body. Paraspinal and other soft tissues: Aortic atherosclerosis. Cholelithiasis. No acute findings within the visualized paravertebral soft tissues. Disc levels: Mild degenerative change within the lower thoracic spine. No more than mild central canal stenosis at any level. CT LUMBAR SPINE FINDINGS Segmentation: 5 lumbar type vertebrae. Alignment: Prominent levoscoliosis centered at the L3 vertebral body level. No evidence of acute vertebral body subluxation. Vertebrae: No fracture line or displaced fracture fragment identified. Upper sacrum appears intact and normally aligned. Paraspinal and other soft tissues: Aortic atherosclerosis. Cholelithiasis. No acute findings seen within the visualized paravertebral soft tissues. Disc levels: Disc desiccations throughout the mid and lower cervical spine, mild to moderate in degree, with associated mild to moderate  central canal stenoses at multiple levels. IMPRESSION: 1. No evidence of acute fracture or acute subluxation within the thoracic or lumbar spine. 2. Prominent scoliosis of the thoracic and lumbar spine. 3. Chronic compression deformity of the T12 vertebral body. 4. Mild degenerative change within the thoracic spine. Mild to moderate degenerative change within the lumbar spine. 5. Aortic atherosclerosis. 6. Cholelithiasis. Electronically Signed   By: Bary RichardStan  Maynard M.D.   On: 04/22/2017 20:10   Ct Lumbar Spine Wo Contrast  Result Date: 04/22/2017 CLINICAL DATA:  Status post fall, back pain. EXAM: CT THORACIC and LUMBAR SPINE WITHOUT CONTRAST TECHNIQUE:  Multidetector CT imaging of the thoracic and lumbar spine was performed without intravenous contrast. Multiplanar CT image reconstructions were also generated. COMPARISON:  Plain film of the lumbar spine dated 05/13/2016 FINDINGS: CT THORACIC SPINE FINDINGS Alignment: Prominent dextroscoliosis of the lower thoracic spine. No evidence of acute vertebral body subluxation. Vertebrae: No fracture line or displaced fracture fragment. Chronic compression fracture deformity of the T12 vertebral body. Paraspinal and other soft tissues: Aortic atherosclerosis. Cholelithiasis. No acute findings within the visualized paravertebral soft tissues. Disc levels: Mild degenerative change within the lower thoracic spine. No more than mild central canal stenosis at any level. CT LUMBAR SPINE FINDINGS Segmentation: 5 lumbar type vertebrae. Alignment: Prominent levoscoliosis centered at the L3 vertebral body level. No evidence of acute vertebral body subluxation. Vertebrae: No fracture line or displaced fracture fragment identified. Upper sacrum appears intact and normally aligned. Paraspinal and other soft tissues: Aortic atherosclerosis. Cholelithiasis. No acute findings seen within the visualized paravertebral soft tissues. Disc levels: Disc desiccations throughout the mid and lower  cervical spine, mild to moderate in degree, with associated mild to moderate central canal stenoses at multiple levels. IMPRESSION: 1. No evidence of acute fracture or acute subluxation within the thoracic or lumbar spine. 2. Prominent scoliosis of the thoracic and lumbar spine. 3. Chronic compression deformity of the T12 vertebral body. 4. Mild degenerative change within the thoracic spine. Mild to moderate degenerative change within the lumbar spine. 5. Aortic atherosclerosis. 6. Cholelithiasis. Electronically Signed   By: Bary Richard M.D.   On: 04/22/2017 20:10   Ct Abdomen Pelvis W Contrast  Result Date: 04/22/2017 CLINICAL DATA:  Fall with pelvic pain and bilateral pubic rami fractures by x-ray. EXAM: CT ABDOMEN AND PELVIS WITH CONTRAST TECHNIQUE: Multidetector CT imaging of the abdomen and pelvis was performed using the standard protocol following bolus administration of intravenous contrast. CONTRAST:  50mL ISOVUE-300 IOPAMIDOL (ISOVUE-300) INJECTION 61% COMPARISON:  Pelvic and bilateral hip x-rays earlier today. Lumbar spine radiographs on 05/13/2016 FINDINGS: Lower chest: Mild scarring at both lung bases. 5 mm calcified granuloma at the left lung base. Hepatobiliary: No hepatic injury or perihepatic hematoma. No patent masses identified. There are some layering calcified gallstones within the gallbladder lumen. No biliary ductal dilatation. Pancreas: Unremarkable. No pancreatic ductal dilatation or surrounding inflammatory changes. Spleen: No splenic injury or perisplenic hematoma. Adrenals/Urinary Tract: Adrenal glands are unremarkable. Kidneys are normal, without renal calculi, focal lesion, or hydronephrosis. No evidence of renal injury. Bladder is distended and without visible gross injury. Stomach/Bowel: Bowel shows no evidence of obstruction, inflammation or perforation. Vascular/Lymphatic: No significant vascular findings are present. No enlarged abdominal or pelvic lymph nodes. Reproductive:  Uterus and bilateral adnexa are unremarkable. Other: No free fluid, hemoperitoneum or hernia. Musculoskeletal: Bilateral superior and inferior pubic rami fractures are present demonstrating mild displacement. No diastasis at the level of the pubic symphysis. No hip fracture or other pelvic fracture identified. There is some associated extraperitoneal hemorrhage adjacent to the pubic rami fractures. Compression deformity of the T12 vertebral body was present on prior lumbar radiographs from 2018 and appears similar with approximately 40-50% loss of vertebral body height. IMPRESSION: 1. Bilateral superior and inferior pubic rami fractures with some associated surrounding extraperitoneal hemorrhage. No evidence of gross bladder injury or other pelvic fractures. 2. Old compression fracture of the T12 vertebral body appears similar to lumbar radiographs on 05/13/2016 Electronically Signed   By: Irish Lack M.D.   On: 04/22/2017 19:42   Dg Hand 2 View Left  Result Date: 04/22/2017  CLINICAL DATA:  Fall with bruising left hand.  Initial encounter. EXAM: LEFT HAND - 2 VIEW COMPARISON:  None. FINDINGS: No visible fracture or dislocation. Soft tissues are unremarkable. No bony lesions. IMPRESSION: No acute fracture identified. Electronically Signed   By: Irish Lack M.D.   On: 04/22/2017 19:15   Dg Hips Bilat W Or Wo Pelvis 2 Views  Result Date: 04/22/2017 CLINICAL DATA:  Fall with bilateral hip pain.  Initial encounter. EXAM: DG HIP (WITH OR WITHOUT PELVIS) 2V BILAT COMPARISON:  05/13/2016 FINDINGS: There are bilateral superior and inferior pubic rami fractures. The right-sided fractures show slightly more displacement compared to left-sided fractures. No overt diastasis of the pubic symphysis. Both hips show normal alignment and no evidence of acute fracture. IMPRESSION: Bilateral superior and inferior pubic rami fractures with slightly more displacement of right-sided fractures compared to the left.  Electronically Signed   By: Irish Lack M.D.   On: 04/22/2017 19:10    EKG: NONE.   Assessment/Plan Principal Problem:   Pelvic fracture (HCC) Active Problems:   COPD (chronic obstructive pulmonary disease) (HCC)   HTN (hypertension)   Parkinson's disease (HCC)   Glaucoma   Osteopenia   Pelvic fracture- likely mechanical fall, with hx of multiple falls, requiring surgeries, Osteopenia hx, and fragility. Ct abd- Bilat sup, inferior pubic rami fractures, with some associated surrounding extraperitoneal hemorrhage. - Formal ortho recommendations - NPO midnight in case - PT/OT - Pain control Scheduled - SCDs for DVT ppx. - Hydrate with dry mucus membranes x 12 hrs N/s 100cc/hr - Will likely need placement  Possible PNA- Chest xray-new clustered nodularity, right apex, related to bronchopneumonia or atypical infection.  CT chest without contrast as clinically warranted. WBC- 7.5. No fever here.  Patient denies cough or difficulty breathing.  Unsuccessfully tried to reach spouse. - Will start IV ceftriazone and IV azithro for now  Dementia-patient able to communicate and answer most of my questions appropriately, at baseline patient ambulated with a walker.   COPD- stable. No wheeze. - Cont home Bronchodils  HTN- soft to stable Bp.  - Cont home atenolol, will hold norvasc for now.  DVT prophylaxis: SCDs Code Status: DNR Family Communication:  Disposition Plan: To be determined Consults called: Ortho Admission status: Inpt, med surg   Onnie Boer MD [facility] Pager [phone]  If 11PM-7AM, please contact night-coverage [email or web address] Password [facility]  04/22/2017, 9:29 PM

## 2017-04-23 ENCOUNTER — Inpatient Hospital Stay
Admission: RE | Admit: 2017-04-23 | Discharge: 2017-06-29 | Disposition: A | Discharge status: Nursing Home (Includes ICF) | Payer: Medicare (Managed Care) | Source: Ambulatory Visit | Attending: Internal Medicine | Admitting: Internal Medicine

## 2017-04-23 ENCOUNTER — Other Ambulatory Visit: Payer: Self-pay

## 2017-04-23 DIAGNOSIS — S32591D Other specified fracture of right pubis, subsequent encounter for fracture with routine healing: Secondary | ICD-10-CM

## 2017-04-23 DIAGNOSIS — S32592D Other specified fracture of left pubis, subsequent encounter for fracture with routine healing: Secondary | ICD-10-CM

## 2017-04-23 MED ORDER — AMOXICILLIN-POT CLAVULANATE 500-125 MG PO TABS: 1.0000 | ORAL_TABLET | Freq: Two times a day (BID) | ORAL | 0 refills | Status: DC

## 2017-04-23 NOTE — Progress Notes (Signed)
Dr Ardyth Harpshernandez instructing this rn to remove foley and catheter.   Report handoff has been given to rene, lpn.

## 2017-04-23 NOTE — Clinical Social Work Note (Signed)
Clinical Social Work Assessment  Patient Details  Name: Sandra Davis MRN: [id number] Date of Birth: [birth year]  Date of referral:  04/23/17               Reason for consult:  Facility Placement                Permission sought to share information with:    Permission granted to share information::     Name::        Agency::  [facility]  Relationship::     Contact Information:  Son, Raiford NobleRick  Housing/Transportation Living arrangements for the past 2 months:  Single Family Home Source of Information:  Other (Comment Required), Adult Children([facility] ) Patient Interpreter Needed:  None Criminal Activity/Legal Involvement Pertinent to Current Situation/Hospitalization:  No - Comment as needed Significant Relationships:  Adult Children, Spouse Lives with:  Adult Children, Spouse Do you feel safe going back to the place where you live?  Yes Need for family participation in patient care:  Yes (Comment)  Care giving concerns:  None identified. [facility] provides a nurse daily for bathing.    Social Worker assessment / plan:  Patient lives with her spouse and son. At baseline she ambulates with a walker, goes to [facility] a few times per week and has ADLs completed by family and Physiological scientistace nurse. Patient's son Raiford NobleRick was advised of the facilities that [facility] contracts with and he made choices.   Employment status:  Retired Health and safety inspectornsurance information:  Other (Comment Required)([facility]) PT Recommendations:  Skilled Nursing Facility Information / Referral to community resources:  Skilled Nursing Facility  Patient/Family's Response to care:  Family is agreeable to SNF for short term rehab.   Patient/Family's Understanding of and Emotional Response to Diagnosis, Current Treatment, and Prognosis:  Family understands patient's diagnosis, treatment and prognosis.   Emotional Assessment Appearance:  Appears stated age Attitude/Demeanor/Rapport:    Affect (typically observed):  Unable  to Assess Orientation:    Alcohol / Substance use:  Not Applicable Psych involvement (Current and /or in the community):  No (Comment)  Discharge Needs  Concerns to be addressed:  Discharge Planning Concerns Readmission within the last 30 days:  No Current discharge risk:  None Barriers to Discharge:  No Barriers Identified   Annice NeedySettle, Addilyn Satterwhite D, LCSW 04/23/2017, 11:25 AM

## 2017-04-23 NOTE — Clinical Social Work Placement (Signed)
   CLINICAL SOCIAL WORK PLACEMENT  NOTE  Date:  04/23/2017  Patient Details  Name: Sandra Davis MRN: [id number] Date of Birth: [birth year]  Clinical Social Work is seeking post-discharge placement for this patient at the Skilled  Nursing Facility level of care (*CSW will initial, date and re-position this form in  chart as items are completed):  Yes   Patient/family provided with [facility] Work Department's list of facilities offering this level of care within the geographic area requested by the patient (or if unable, by the patient's family).  Yes   Patient/family informed of their freedom to choose among providers that offer the needed level of care, that participate in Medicare, Medicaid or managed care program needed by the patient, have an available bed and are willing to accept the patient.  Yes   Patient/family informed of [facility]'s ownership interest in [facility] and [facility], as well as of the fact that they are under no obligation to receive care at these facilities.  PASRR submitted to EDS on 04/23/17     PASRR number received on 04/23/17     Existing PASRR number confirmed on       FL2 transmitted to all facilities in geographic area requested by pt/family on       FL2 transmitted to all facilities within larger geographic area on       Patient informed that his/her managed care company has contracts with or will negotiate with certain facilities, including the following:            Patient/family informed of bed offers received.  Patient [facility]     Physician recommends and patient chooses bed at      Patient to be transferred to [facility] on 04/23/17.  [facility]     Patient family notified on 04/23/17 of transfer.  Name of family member notified:  Raiford NobleRick, son; daughter and spouse     PHYSICIAN       Additional Comment:     _______________________________________________ Annice NeedySettle, Merrin Mcvicker D, LCSW 04/23/2017, 1:33 PM

## 2017-04-23 NOTE — Progress Notes (Signed)
Patient arrived to unit at approximately 2230. Patient was originally pleasant and quiet, acknowledged this RN's name. After slide transfer from stretcher to bed, patient began yelling and repeatedly calling out to her husband and saying "I love you". Patient was increasingly agitated and combative. This RN and Toniann FailWendy, NT applied mitts d/t patient pulling on foley catheter. Patient repeatedly grabbing and pinching staff, even with mitts on hands. This RN and Toniann FailWendy, NT unable to get accurate VS d/t patient's agitation, combativeness, and restlessness. This RN attempted to calm patient and reassure patient. Patient would not follow commands or listen. Patient continued yelling out and attempting to remove mitts, even when staff were not in room. Schorr, NP notified. Ativan 0.5 mg IV given per one time dose. Patient was then calm if left alone, but still very combative if staff tried to do anything for her. Patient was punching, pinching, and kicking when this RN was getting VS, starting IV meds, applying statlock for foley, applied wristbands, etc. Patient resting calmly in bed with eyes closed at this time. Bed alarm on.

## 2017-04-23 NOTE — Progress Notes (Signed)
Patient A&O to self, and knows she is in a hospital. Patient yelling out for her husband, fidgeting. This RN crushed pain pill and attempted to give it to patient in ice cream. Patient reluctant to take medication, then spit some out. This RN attempted to help patient get a drink but patient was biting and blowing through the straw, then grabbing at the cup, even with mitts on.

## 2017-04-23 NOTE — Progress Notes (Signed)
This Rn has spoken to [facility], [facility] stating they have their own DNR made for patient.

## 2017-04-23 NOTE — Consult Note (Addendum)
Reason for Consult: Pubic rami fractures Referring Physician: Dr. Jerilee Hoh, A  Sandra Davis is an 84 y.o. female.  HPI: 84 year old female had a mechanical fall low level low energy presented to the emergency room inability to walk x-rays show superior and inferior pubic rami fractures right and left pelvis.  She came in complaining of dull constant moderately severe bilateral groin pain and inability to walk.  Past Medical History:  Diagnosis Date  . Arthritis    osteoporosis  . Back pain   . COPD (chronic obstructive pulmonary disease) ([place])   . Gait instability   . Glaucoma   . Glaucoma   . Hypertension   . Neck pain   . Parkinson's disease ([place])   . Tremor     Past Surgical History:  Procedure Laterality Date  . CATARACT EXTRACTION    . EYE SURGERY    . TONSILLECTOMY    . TUBAL LIGATION      Family History  Problem Relation Age of Onset  . Heart failure Mother     Social History:  reports that  has never smoked. she has never used smokeless tobacco. She reports that she does not drink alcohol or use drugs.  Allergies:  Allergies  Allergen Reactions  . Hydrochlorothiazide Other (See Comments)    Reaction:  Dizziness   . Lisinopril Cough  . Other Other (See Comments)    Pt states that she is allergic to multiple BP meds -- does not recall the names.   Reaction:  Dizziness   . Spironolactone Other (See Comments)    Reaction:  Blurred vision and sweating of feet     Medications:  Meds ordered this encounter  Medications  . morphine 2 MG/ML injection 2 mg  . iopamidol (ISOVUE-300) 61 % injection 50 mL  . sodium chloride 0.9 % bolus 500 mL  . morphine 2 MG/ML injection 2 mg  . aspirin EC tablet 325 mg  . atenolol (TENORMIN) tablet 50 mg  . mometasone-formoterol (DULERA) 200-5 MCG/ACT inhaler 2 puff  . HYDROcodone-acetaminophen (NORCO/VICODIN) 5-325 MG per tablet 1 tablet    1 tab PO q12 hours prn pain    . pantoprazole (PROTONIX) EC tablet 40 mg  .  timolol (TIMOPTIC) 0.5 % ophthalmic solution 1 drop  . cefTRIAXone (ROCEPHIN) 1 g in dextrose 5 % 50 mL IVPB    Order Specific Question:   Antibiotic Indication:    Answer:   CAP  . azithromycin (ZITHROMAX) 500 mg in dextrose 5 % 250 mL IVPB    Order Specific Question:   Antibiotic Indication:    Answer:   CAP  . 0.9 %  sodium chloride infusion  . ipratropium-albuterol (DUONEB) 0.5-2.5 (3) MG/3ML nebulizer solution 3 mL  . LORazepam (ATIVAN) injection 0.5 mg     Results for orders placed or performed during the hospital encounter of 04/22/17 (from the past 48 hour(s))  I-stat chem 8, ed     Status: Abnormal   Collection Time: 04/22/17  6:16 PM  Result Value Ref Range   Sodium 136 135 - 145 mmol/L   Potassium 4.5 3.5 - 5.1 mmol/L   Chloride 100 (L) 101 - 111 mmol/L   BUN 16 6 - 20 mg/dL   Creatinine, Ser 0.60 0.44 - 1.00 mg/dL   Glucose, Bld 117 (H) 65 - 99 mg/dL   Calcium, Ion 1.12 (L) 1.15 - 1.40 mmol/L   TCO2 25 22 - 32 mmol/L   Hemoglobin 12.9 12.0 - 15.0 g/dL  HCT 38.0 36.0 - 65.4 %  Basic metabolic panel     Status: Abnormal   Collection Time: 04/22/17  7:34 PM  Result Value Ref Range   Sodium 132 (L) 135 - 145 mmol/L   Potassium 4.4 3.5 - 5.1 mmol/L   Chloride 100 (L) 101 - 111 mmol/L   CO2 22 22 - 32 mmol/L   Glucose, Bld 117 (H) 65 - 99 mg/dL   BUN 17 6 - 20 mg/dL   Creatinine, Ser 0.58 0.44 - 1.00 mg/dL   Calcium 8.7 (L) 8.9 - 10.3 mg/dL   GFR calc non Af Amer >60 >60 mL/min   GFR calc Af Amer >60 >60 mL/min    Comment: (NOTE) The eGFR has been calculated using the CKD EPI equation. This calculation has not been validated in all clinical situations. eGFR's persistently <60 mL/min signify possible Chronic Kidney Disease.    Anion gap 10 5 - 15  CBC with Differential     Status: Abnormal   Collection Time: 04/22/17  7:34 PM  Result Value Ref Range   WBC 7.5 4.0 - 10.5 K/uL   RBC 3.73 (L) 3.87 - 5.11 MIL/uL   Hemoglobin 11.9 (L) 12.0 - 15.0 g/dL   HCT 36.5  36.0 - 46.0 %   MCV 97.9 78.0 - 100.0 fL   MCH 31.9 26.0 - 34.0 pg   MCHC 32.6 30.0 - 36.0 g/dL   RDW 13.4 11.5 - 15.5 %   Platelets 292 150 - 400 K/uL   Neutrophils Relative % 63 %   Neutro Abs 4.7 1.7 - 7.7 K/uL   Lymphocytes Relative 17 %   Lymphs Abs 1.3 0.7 - 4.0 K/uL   Monocytes Relative 13 %   Monocytes Absolute 1.0 0.1 - 1.0 K/uL   Eosinophils Relative 6 %   Eosinophils Absolute 0.5 0.0 - 0.7 K/uL   Basophils Relative 1 %   Basophils Absolute 0.1 0.0 - 0.1 K/uL  Protime-INR     Status: None   Collection Time: 04/22/17  7:34 PM  Result Value Ref Range   Prothrombin Time 12.8 11.4 - 15.2 seconds   INR 0.97     Dg Chest 1 View  Result Date: 04/22/2017 CLINICAL DATA:  [place] backwards onto floor. EXAM: CHEST 1 VIEW COMPARISON:  02/28/2017 FINDINGS: The cardio pericardial silhouette is enlarged. Interstitial markings are diffusely coarsened with chronic features. Clustered nodularity in the right apex is new in the interval. No edema or pleural effusion. Chronic posttraumatic deformity noted proximal right humerus. Telemetry leads overlie the chest. IMPRESSION: Cardiomegaly with hyperexpansion and chronic interstitial changes. Clustered nodularity in the right apex is new in the interval. This may be related to bronchopneumonia or atypical infection. CT chest without contrast could be used to further evaluate as clinically warranted. Electronically Signed   By: Misty  M.D.   On: 04/22/2017 19:06   Dg Elbow 2 Views Right  Result Date: 04/22/2017 CLINICAL DATA:  Fall with bruising and swelling to right elbow. Initial encounter. EXAM: RIGHT ELBOW - 2 VIEW COMPARISON:  None. FINDINGS: There is no evidence of fracture, dislocation, or joint effusion. There is no evidence of arthropathy or other focal bone abnormality. Soft tissues are unremarkable. IMPRESSION: Negative. Electronically Signed   By: Aletta Edouard M.D.   On: 04/22/2017 19:14   Ct Head Wo Contrast  Result Date:  04/22/2017 CLINICAL DATA:  Status post fall backwards. EXAM: CT HEAD WITHOUT CONTRAST CT CERVICAL SPINE WITHOUT CONTRAST TECHNIQUE: Multidetector CT imaging  of the head and cervical spine was performed following the standard protocol without intravenous contrast. Multiplanar CT image reconstructions of the cervical spine were also generated. COMPARISON:  05/13/2016 FINDINGS: CT HEAD FINDINGS Brain: No evidence of acute infarction, hemorrhage, hydrocephalus, extra-axial collection or mass lesion/mass effect. Moderate brain parenchymal atrophy and deep white matter microangiopathy. Vascular: Calcific atherosclerotic disease at the skull base. Skull: Normal. Negative for fracture or focal lesion. Sinuses/Orbits: No acute finding. Other: None. CT CERVICAL SPINE FINDINGS Alignment: Straightening of cervical lordosis. Skull base and vertebrae: No acute fracture. No primary bone lesion or focal pathologic process. Soft tissues and spinal canal: No prevertebral fluid or swelling. No visible canal hematoma. Disc levels:  Multilevel osteoarthritic changes. Upper chest: Negative. Other: None. IMPRESSION: No acute intracranial abnormality. Atrophy, chronic microvascular disease. No evidence of acute traumatic injury to the cervical spine. Straightening of the cervical lordosis, a osteoarthritic changes. Electronically Signed   By: Fidela Salisbury M.D.   On: 04/22/2017 19:38   Ct Cervical Spine Wo Contrast  Result Date: 04/22/2017 CLINICAL DATA:  Status post fall backwards. EXAM: CT HEAD WITHOUT CONTRAST CT CERVICAL SPINE WITHOUT CONTRAST TECHNIQUE: Multidetector CT imaging of the head and cervical spine was performed following the standard protocol without intravenous contrast. Multiplanar CT image reconstructions of the cervical spine were also generated. COMPARISON:  05/13/2016 FINDINGS: CT HEAD FINDINGS Brain: No evidence of acute infarction, hemorrhage, hydrocephalus, extra-axial collection or mass lesion/mass  effect. Moderate brain parenchymal atrophy and deep white matter microangiopathy. Vascular: Calcific atherosclerotic disease at the skull base. Skull: Normal. Negative for fracture or focal lesion. Sinuses/Orbits: No acute finding. Other: None. CT CERVICAL SPINE FINDINGS Alignment: Straightening of cervical lordosis. Skull base and vertebrae: No acute fracture. No primary bone lesion or focal pathologic process. Soft tissues and spinal canal: No prevertebral fluid or swelling. No visible canal hematoma. Disc levels:  Multilevel osteoarthritic changes. Upper chest: Negative. Other: None. IMPRESSION: No acute intracranial abnormality. Atrophy, chronic microvascular disease. No evidence of acute traumatic injury to the cervical spine. Straightening of the cervical lordosis, a osteoarthritic changes. Electronically Signed   By: Fidela Salisbury M.D.   On: 04/22/2017 19:38   Ct Thoracic Spine Wo Contrast  Result Date: 04/22/2017 CLINICAL DATA:  Status post fall, back pain. EXAM: CT THORACIC and LUMBAR SPINE WITHOUT CONTRAST TECHNIQUE: Multidetector CT imaging of the thoracic and lumbar spine was performed without intravenous contrast. Multiplanar CT image reconstructions were also generated. COMPARISON:  Plain film of the lumbar spine dated 05/13/2016 FINDINGS: CT THORACIC SPINE FINDINGS Alignment: Prominent dextroscoliosis of the lower thoracic spine. No evidence of acute vertebral body subluxation. Vertebrae: No fracture line or displaced fracture fragment. Chronic compression fracture deformity of the T12 vertebral body. Paraspinal and other soft tissues: Aortic atherosclerosis. Cholelithiasis. No acute findings within the visualized paravertebral soft tissues. Disc levels: Mild degenerative change within the lower thoracic spine. No more than mild central canal stenosis at any level. CT LUMBAR SPINE FINDINGS Segmentation: 5 lumbar type vertebrae. Alignment: Prominent levoscoliosis centered at the L3 vertebral  body level. No evidence of acute vertebral body subluxation. Vertebrae: No fracture line or displaced fracture fragment identified. Upper sacrum appears intact and normally aligned. Paraspinal and other soft tissues: Aortic atherosclerosis. Cholelithiasis. No acute findings seen within the visualized paravertebral soft tissues. Disc levels: Disc desiccations throughout the mid and lower cervical spine, mild to moderate in degree, with associated mild to moderate central canal stenoses at multiple levels. IMPRESSION: 1. No evidence of acute fracture or acute subluxation  within the thoracic or lumbar spine. 2. Prominent scoliosis of the thoracic and lumbar spine. 3. Chronic compression deformity of the T12 vertebral body. 4. Mild degenerative change within the thoracic spine. Mild to moderate degenerative change within the lumbar spine. 5. Aortic atherosclerosis. 6. Cholelithiasis. Electronically Signed   By: Franki Cabot M.D.   On: 04/22/2017 20:10   Ct Lumbar Spine Wo Contrast  Result Date: 04/22/2017 CLINICAL DATA:  Status post fall, back pain. EXAM: CT THORACIC and LUMBAR SPINE WITHOUT CONTRAST TECHNIQUE: Multidetector CT imaging of the thoracic and lumbar spine was performed without intravenous contrast. Multiplanar CT image reconstructions were also generated. COMPARISON:  Plain film of the lumbar spine dated 05/13/2016 FINDINGS: CT THORACIC SPINE FINDINGS Alignment: Prominent dextroscoliosis of the lower thoracic spine. No evidence of acute vertebral body subluxation. Vertebrae: No fracture line or displaced fracture fragment. Chronic compression fracture deformity of the T12 vertebral body. Paraspinal and other soft tissues: Aortic atherosclerosis. Cholelithiasis. No acute findings within the visualized paravertebral soft tissues. Disc levels: Mild degenerative change within the lower thoracic spine. No more than mild central canal stenosis at any level. CT LUMBAR SPINE FINDINGS Segmentation: 5 lumbar  type vertebrae. Alignment: Prominent levoscoliosis centered at the L3 vertebral body level. No evidence of acute vertebral body subluxation. Vertebrae: No fracture line or displaced fracture fragment identified. Upper sacrum appears intact and normally aligned. Paraspinal and other soft tissues: Aortic atherosclerosis. Cholelithiasis. No acute findings seen within the visualized paravertebral soft tissues. Disc levels: Disc desiccations throughout the mid and lower cervical spine, mild to moderate in degree, with associated mild to moderate central canal stenoses at multiple levels. IMPRESSION: 1. No evidence of acute fracture or acute subluxation within the thoracic or lumbar spine. 2. Prominent scoliosis of the thoracic and lumbar spine. 3. Chronic compression deformity of the T12 vertebral body. 4. Mild degenerative change within the thoracic spine. Mild to moderate degenerative change within the lumbar spine. 5. Aortic atherosclerosis. 6. Cholelithiasis. Electronically Signed   By: Franki Cabot M.D.   On: 04/22/2017 20:10   Ct Abdomen Pelvis W Contrast  Result Date: 04/22/2017 CLINICAL DATA:  Fall with pelvic pain and bilateral pubic rami fractures by x-ray. EXAM: CT ABDOMEN AND PELVIS WITH CONTRAST TECHNIQUE: Multidetector CT imaging of the abdomen and pelvis was performed using the standard protocol following bolus administration of intravenous contrast. CONTRAST:  44m ISOVUE-300 IOPAMIDOL (ISOVUE-300) INJECTION 61% COMPARISON:  Pelvic and bilateral hip x-rays earlier today. Lumbar spine radiographs on 05/13/2016 FINDINGS: Lower chest: Mild scarring at both lung bases. 5 mm calcified granuloma at the left lung base. Hepatobiliary: No hepatic injury or perihepatic hematoma. No patent masses identified. There are some layering calcified gallstones within the gallbladder lumen. No biliary ductal dilatation. Pancreas: Unremarkable. No pancreatic ductal dilatation or surrounding inflammatory changes. Spleen:  No splenic injury or perisplenic hematoma. Adrenals/Urinary Tract: Adrenal glands are unremarkable. Kidneys are normal, without renal calculi, focal lesion, or hydronephrosis. No evidence of renal injury. Bladder is distended and without visible gross injury. Stomach/Bowel: Bowel shows no evidence of obstruction, inflammation or perforation. Vascular/Lymphatic: No significant vascular findings are present. No enlarged abdominal or pelvic lymph nodes. Reproductive: Uterus and bilateral adnexa are unremarkable. Other: No free fluid, hemoperitoneum or hernia. Musculoskeletal: Bilateral superior and inferior pubic rami fractures are present demonstrating mild displacement. No diastasis at the level of the pubic symphysis. No hip fracture or other pelvic fracture identified. There is some associated extraperitoneal hemorrhage adjacent to the pubic rami fractures. Compression deformity of the  T12 vertebral body was present on prior lumbar radiographs from 2018 and appears similar with approximately 40-50% loss of vertebral body height. IMPRESSION: 1. Bilateral superior and inferior pubic rami fractures with some associated surrounding extraperitoneal hemorrhage. No evidence of gross bladder injury or other pelvic fractures. 2. Old compression fracture of the T12 vertebral body appears similar to lumbar radiographs on 05/13/2016 Electronically Signed   By: Aletta Edouard M.D.   On: 04/22/2017 19:42   Dg Hand 2 View Left  Result Date: 04/22/2017 CLINICAL DATA:  Fall with bruising left hand.  Initial encounter. EXAM: LEFT HAND - 2 VIEW COMPARISON:  None. FINDINGS: No visible fracture or dislocation. Soft tissues are unremarkable. No bony lesions. IMPRESSION: No acute fracture identified. Electronically Signed   By: Aletta Edouard M.D.   On: 04/22/2017 19:15   Dg Hips Bilat W Or Wo Pelvis 2 Views  Result Date: 04/22/2017 CLINICAL DATA:  Fall with bilateral hip pain.  Initial encounter. EXAM: DG HIP (WITH OR WITHOUT  PELVIS) 2V BILAT COMPARISON:  05/13/2016 FINDINGS: There are bilateral superior and inferior pubic rami fractures. The right-sided fractures show slightly more displacement compared to left-sided fractures. No overt diastasis of the pubic symphysis. Both hips show normal alignment and no evidence of acute fracture. IMPRESSION: Bilateral superior and inferior pubic rami fractures with slightly more displacement of right-sided fractures compared to the left. Electronically Signed   By: Aletta Edouard M.D.   On: 04/22/2017 19:10    ROS Blood pressure 120/68, pulse 68, temperature 98.6 F (37 C), temperature source Axillary, resp. rate 18, height 5' (1.524 m), weight 97 lb 0 oz (44 kg), SpO2 95 %. Physical Exam  Constitutional: She appears well-developed and well-nourished. No distress.  HENT:  Head: Normocephalic and atraumatic.  Skin: Skin is warm and dry. She is not diaphoretic.  Psychiatric: Her behavior is normal. Her affect is blunt. Cognition and memory are impaired.  Reportedly her psychological exam was as above    Gait the patient is unable to walk  I could not arouse the patient.  She was crouched into a ball with third hips flexed knees flexed.  Remaining physical exam could not be performed.    Assessment/Plan: Imaging studies include AP pelvis AP lateral right hip and CT scan of hip and pelvis reports are noted above  He basically has minimally displaced bilateral superior and inferior pubic rami fractures which can be managed without surgery.  Pain control physical therapy assessment weight-bear as tolerated  X-ray will be required in 6 weeks    Arther Abbott 04/23/2017, 8:10 AM

## 2017-04-23 NOTE — [place] FL2 (Signed)
[place] MEDICAID FL2 LEVEL OF CARE SCREENING TOOL     IDENTIFICATION  Patient Name: Sandra Davis Birthdate: 03-25-1929 Sex: female Admission Date (Current Location): 04/22/2017  [facility] and [place] Number:  [employer] and Address:  [facility],  [address], [place] [address]      Provider Number: [id number]  Attending Physician Name and Address:  Philip Aspen, Minerva Ends*  Relative Name and Phone Number:       Current Level of Care: Hospital Recommended Level of Care: Skilled Nursing Facility Prior Approval Number:    Date Approved/Denied:   PASRR Number: [id number])  Discharge Plan: SNF    Current Diagnoses: Patient Active Problem List   Diagnosis Date Noted  . Pelvic fracture (HCC) 04/22/2017  . COPD (chronic obstructive pulmonary disease) (HCC) 04/22/2017  . HTN (hypertension) 04/22/2017  . Parkinson's disease (HCC) 04/22/2017  . Glaucoma 04/22/2017  . Osteopenia 04/22/2017  . Bilateral fracture of pubic rami (HCC) 04/22/2017  . Traumatic closed nondisp fracture of greater tuberosity of humerus, left, initial encounter 12/08/2016  . Dizziness 10/13/2016    Orientation RESPIRATION BLADDER Height & Weight        Normal Continent Weight: 97 lb 0 oz (44 kg) Height:  5' (152.4 cm)  BEHAVIORAL SYMPTOMS/MOOD NEUROLOGICAL BOWEL NUTRITION STATUS      Continent Diet(Regular)  AMBULATORY STATUS COMMUNICATION OF NEEDS Skin   Extensive Assist Verbally Normal                       Personal Care Assistance Level of Assistance  Bathing, Feeding, Dressing Bathing Assistance: Limited assistance Feeding assistance: Limited assistance Dressing Assistance: Limited assistance     Functional Limitations Info  Sight, Hearing, Speech Sight Info: Adequate Hearing Info: Adequate Speech Info: Adequate    SPECIAL CARE FACTORS FREQUENCY  PT (By licensed PT)     PT Frequency: 5x/week              Contractures  Contractures Info: Not present    Additional Factors Info  Code Status, Allergies Code Status Info: DNR Allergies Info: Hydrochlorothiazide, Lisiniopril, Other, Spinonolactone           Current Medications (04/23/2017):  This is the current hospital active medication list Current Facility-Administered Medications  Medication Dose Route Frequency Provider Last Rate Last Dose  . aspirin EC tablet 325 mg  325 mg Oral Daily Emokpae, Ejiroghene E, MD   325 mg at 04/23/17 0922  . atenolol (TENORMIN) tablet 50 mg  50 mg Oral Daily Emokpae, Ejiroghene E, MD   50 mg at 04/23/17 0922  . azithromycin (ZITHROMAX) 500 mg in dextrose 5 % 250 mL IVPB  500 mg Intravenous Q24H Emokpae, Ejiroghene E, MD   Stopped at 04/23/17 0105  . cefTRIAXone (ROCEPHIN) 1 g in dextrose 5 % 50 mL IVPB  1 g Intravenous Q24H Emokpae, Ejiroghene E, MD   Stopped at 04/23/17 0005  . HYDROcodone-acetaminophen (NORCO/VICODIN) 5-325 MG per tablet 1 tablet  1 tablet Oral Q6H Emokpae, Ejiroghene E, MD   1 tablet at 04/23/17 1013  . ipratropium-albuterol (DUONEB) 0.5-2.5 (3) MG/3ML nebulizer solution 3 mL  3 mL Nebulization Q6H PRN Emokpae, Ejiroghene E, MD      . mometasone-formoterol (DULERA) 200-5 MCG/ACT inhaler 2 puff  2 puff Inhalation BID Emokpae, Ejiroghene E, MD      . pantoprazole (PROTONIX) EC tablet 40 mg  40 mg Oral Daily Emokpae, Ejiroghene E, MD   40 mg at 04/23/17 0922  .  timolol (TIMOPTIC) 0.5 % ophthalmic solution 1 drop  1 drop Both Eyes BID Emokpae, Ejiroghene E, MD   1 drop at 04/23/17 1015     Discharge Medications: Please see discharge summary for a list of discharge medications.  Relevant Imaging Results:  Relevant Lab Results:   Additional Information SSN [id number]   [name], [name], LCSW

## 2017-04-23 NOTE — Plan of Care (Signed)
  Progressing Acute Rehab PT Goals(only PT should resolve) Pt Will Go Supine/Side To Sit 04/23/2017 1210 - Progressing by Verne CarrowGill, Kirstine Jacquin, PT Flowsheets Taken 04/23/2017 1210  Pt will go Supine/Side to Sit with minimal assist Patient Will Perform Sitting Balance 04/23/2017 1210 - Progressing by Verne CarrowGill, Myrakle Wingler, PT Flowsheets Taken 04/23/2017 1210  Patient will perform sitting balance with min guard assist Patient Will Transfer Sit To/From Stand 04/23/2017 1210 - Progressing by Verne CarrowGill, Sophonie Goforth, PT Flowsheets Taken 04/23/2017 1210  Patient will transfer sit to/from stand with minimal assist Pt Will Transfer Bed To Chair/Chair To Bed 04/23/2017 1210 - Progressing by Verne CarrowGill, Eryca Bolte, PT Flowsheets Taken 04/23/2017 1210  Pt will Transfer Bed to Chair/Chair to Bed with min assist Pt Will Ambulate 04/23/2017 1210 - Progressing by Verne CarrowGill, Brennen Camper, PT Flowsheets Taken 04/23/2017 1210  Pt will Ambulate 10 feet;with rolling walker  Verne CarrowMacy Emaly Boschert PT, DPT 12:11 PM, 04/23/17 [phone]

## 2017-04-23 NOTE — Plan of Care (Signed)
Pt progressing

## 2017-04-23 NOTE — Evaluation (Signed)
Physical Therapy Evaluation Patient Details Name: Sandra Davis MRN: [id number] DOB: [birth year] Today's Date: 04/23/2017   History of Present Illness  Sandra Davis is a 84 y.o. female with medical history significant for HTN, Parkinsons, glaucoma, COPD, dementia, multiple fracture hx. Physician at admission stated that patient answered most of my questions appropriately, but could not give details.  Patient could not remember her fall, but was able to say she was having pain in her right elbow and pelvic region. Patient was brought to the ED via EMS with reports that patient was standing on the floor , reaching into a cabinet, and fell backwards onto hardwood floor.  Patient denies chest pain, no difficulty of breathing or cough.    Clinical Impression   Patient was found asleep in bed upon entering room. Patient required frequent cueing to wake up and seemed lethargic. Patient's husband and daughter entered room shortly after. Patient performed bed mobility with max assist to perform supine to sit with frequent verbal cues. Patient required moderate assistance to scoot forward to allow feet to touch floor. Patient performed sit from standing with max assist using a rolling walker. Patient performed side stepping for 3 very small steps up towards head of bed. Patient performed sit to supine with total assist at trunk and lower extremities. Patient was cued and assisted to straighten lower extremities in bed. Patient would benefit from continued skilled physical therapy in order to address patient's deficits in strength, balance, transfers, gait, and overall functional mobility.    Follow Up Recommendations SNF    Equipment Recommendations       Recommendations for Other Services       Precautions / Restrictions Precautions Precautions: Fall Restrictions Weight Bearing Restrictions: Yes RLE Weight Bearing: Weight bearing as tolerated LLE Weight Bearing: Weight bearing as tolerated       Mobility  Bed Mobility Overal bed mobility: Needs Assistance Bed Mobility: Supine to Sit     Supine to sit: Max assist     General bed mobility comments: Patient required max assist and frequent verbal cues in order to perform supine to sit and to maintain sitting. Patient required moderate assistance to scoot forward to allow feet to touch floor.   Transfers Overall transfer level: Needs assistance Equipment used: Rolling walker (2 wheeled) Transfers: Sit to/from Stand Sit to Stand: Max assist         General transfer comment: Patient performed sit to stand with max assist and frequent verbal cues to stand up.   Ambulation/Gait             General Gait Details: Patient performed side stepping for severall small steps up to head of bed using rolling walker requiring max assist with a very slow cadence.   Stairs            Wheelchair Mobility    Modified Rankin (Stroke Patients Only)       Balance Overall balance assessment: Needs assistance;History of Falls Sitting-balance support: Bilateral upper extremity supported Sitting balance-Leahy Scale: Poor Sitting balance - Comments: Patient required moderate assistance at posterior trunk to maintain sitting balance. Postural control: Posterior lean Standing balance support: Bilateral upper extremity supported Standing balance-Leahy Scale: Poor Standing balance comment: Patient required moderate assistance in order to maintain standing balance with rolling walker.                             Pertinent Vitals/Pain Pain Assessment: Faces Faces Pain Scale:  Hurts whole lot Pain Location: Groin area Pain Intervention(s): Limited activity within patient's tolerance;Monitored during session;Premedicated before session    Home Living Family/patient expects to be discharged to:: Skilled nursing facility(Previously lived at a private residence) Living Arrangements: Spouse/significant other Available  Help at Discharge: Family;Available PRN/intermittently;Other (Comment)(Care givers come for 5 hours and later for 2 hours on Tuesday, Thursday, Saturday) Type of Home: House Home Access: Stairs to enter Entrance Stairs-Rails: None Entrance Stairs-Number of Steps: 3 Home Layout: Two level Home Equipment: Grab bars - toilet;Shower seat;Walker - 4 wheels      Prior Function Level of Independence: Needs assistance   Gait / Transfers Assistance Needed: Husband assisted with transfers patient would walk with walker   ADL's / Homemaking Assistance Needed: Husband and caregivers provided assistance with ADL's homemaking        Hand Dominance        Extremity/Trunk Assessment   Upper Extremity Assessment Upper Extremity Assessment: Generalized weakness    Lower Extremity Assessment Lower Extremity Assessment: Generalized weakness(Patient demonstrated preference for a flexed position with lower extremities in bed )    Cervical / Trunk Assessment Cervical / Trunk Assessment: Kyphotic  Communication   Communication: (Patient has dementia, patient was speaking English to therapist, but Spanish with her daughter)  Cognition Arousal/Alertness: Lethargic;Suspect due to medications Behavior [facility] for tasks assessed/performed Overall Cognitive Status: History of cognitive impairments - at baseline                                 General Comments: Family members were present to get baseline assessment      General Comments General comments (skin integrity, edema, etc.): Noted edema and bruising of brown color at right elbow.     Exercises     Assessment/Plan    PT Assessment Patient needs continued PT services  PT Problem List Decreased strength;Decreased cognition;Decreased activity tolerance;Decreased balance;Pain;Decreased mobility       PT Treatment Interventions DME instruction;Balance training;Gait training;Functional mobility  training;Patient/family education;Therapeutic activities;Therapeutic exercise    PT Goals (Current goals can be found in the Care Plan section)  Acute Rehab PT Goals Patient Stated Goal: Family stated goal is to go somewhere to receive rehab and then go to an assisted living facility. PT Goal Formulation: With patient/family Time For Goal Achievement: 04/30/17 Potential to Achieve Goals: Fair    Frequency Min 3X/week   Barriers to discharge        Co-evaluation               AM-PAC PT "6 Clicks" Daily Activity  Outcome Measure Difficulty turning over in bed (including adjusting bedclothes, sheets and blankets)?: A Lot Difficulty moving from lying on back to sitting on the side of the bed? : A Lot Difficulty sitting down on and standing up from a chair with arms (e.g., wheelchair, bedside commode, etc,.)?: A Lot Help needed moving to and from a bed to chair (including a wheelchair)?: A Lot Help needed walking in hospital room?: A Lot Help needed climbing 3-5 steps with a railing? : A Lot 6 Click Score: 12    End of Session   Activity Tolerance: Patient limited by pain;Patient limited by fatigue Patient left: in bed;with call bell/phone within reach;with bed alarm set;with family/visitor present Nurse Communication: Mobility status PT Visit Diagnosis: Unsteadiness on feet (R26.81);Other abnormalities of gait and mobility (R26.89);Muscle weakness (generalized) (M62.81);History of falling (Z91.81)    Time:  1610-9604 PT Time Calculation (min) (ACUTE ONLY): 29 min   Charges:   PT Evaluation $PT Eval Moderate Complexity: 1 Mod PT Treatments $Therapeutic Activity: 8-22 mins   PT G Codes:       Verne Carrow PT, DPT 12:08 PM, 04/23/17 [phone]

## 2017-04-23 NOTE — Clinical Social Work Placement (Signed)
   CLINICAL SOCIAL WORK PLACEMENT  NOTE  Date:  04/23/2017  Patient Details  Name: Sandra Davis MRN: [id number] Date of Birth: 1928/09/16  Clinical Social Work is seeking post-discharge placement for this patient at the Skilled  Nursing Facility level of care (*CSW will initial, date and re-position this form in  chart as items are completed):  Yes   Patient/family provided with [facility] Clinical Social Work Department's list of facilities offering this level of care within the geographic area requested by the patient (or if unable, by the patient's family).  Yes   Patient/family informed of their freedom to choose among providers that offer the needed level of care, that participate in Medicare, Medicaid or managed care program needed by the patient, have an available bed and are willing to accept the patient.  Yes   Patient/family informed of [place]'s ownership interest in [facility] and [facility], as well as of the fact that they are under no obligation to receive care at these facilities.  PASRR submitted to EDS on 04/23/17     PASRR number received on 04/23/17     Existing PASRR number confirmed on       FL2 transmitted to all facilities in geographic area requested by pt/family on       FL2 transmitted to all facilities within larger geographic area on       Patient informed that his/her managed care company has contracts with or will negotiate with certain facilities, including the following:            Patient/family informed of bed offers received.  Patient chooses bed at       Physician recommends and patient chooses bed at      Patient to be transferred to   on  .  Patient to be transferred to facility by       Patient family notified on   of transfer.  Name of family member notified:        PHYSICIAN       Additional Comment:    _______________________________________________ Sandra Davis, Sandra Florence D, LCSW 04/23/2017, 12:50 PM

## 2017-04-23 NOTE — Discharge Summary (Signed)
Physician Discharge Summary  Sandra Davis WUJ:[id number] DOB: [birth year] DOA: 04/22/2017  PCP: Estevan Oaks, NP  Admit date: 04/22/2017 Discharge date: 04/23/2017  Time spent: 45 minutes  Recommendations for Outpatient Follow-up:  -Will be discharged to SNF today for ST-rehab following hip fracture.   Discharge Diagnoses:  Principal Problem:   Bilateral fracture of pubic rami [facility]) Active Problems:   Pelvic fracture (HCC)   COPD (chronic obstructive pulmonary disease) (HCC)   HTN (hypertension)   Parkinson's disease (HCC)   Glaucoma   Osteopenia   Discharge Condition: Stable   Filed Weights   04/22/17 1727 04/22/17 2250  Weight: 38.1 kg (84 lb) 44 kg (97 lb 0 oz)    History of present illness:  As per Dr. Mariea Clonts on 1/10: Sandra Davis is a 84 y.o. female with medical history significant for HTN, Parkinsons, glaucoma, COPD, dementia, multiple fracture hx. Patient answering most of my questions appropriately, but cannot give details.  Patient cannot remember her fall, but is able to tell me she is having pain in her right elbow and pelvic region. Patient was brought to the ED via EMS with reports that patient was standing on the floor , reaching into a cabinet, and fell backwards onto hardwood floor.  Patient denies chest pain, no difficulty of breathing or cough.  She tells me she feels hot-temperature recheck 97.7 , but cannot tell me she has had fevers.  ED Course: O2 sats 90% on room air but otherwise stable vitals.  Sodium mildly low at 132 otherwise unremarkable, WBC 7.5.  Due to dementia status patient had several imaging studies on admission,  included bilateral superior and inferior pubic rami fractures, subsequent abdominal CT, confirmed pubic fracture and showed some extraperitoneal hemorrhage right elbow x-ray, left hand x-ray negative for fracture, head CT and cervical CT-no acute abnormality.  EDP talked with [facility]- history of multiple fractures of the spine,  hips, and pelvis with many prior surgeries. Fragility reportedly due to osteopenia and age. Orthopedist on call Dr. Romeo Apple was consulted,- recs- With ground-level fall and patient with dementia and DNR status would not offer surgery. Would recommend PT/OT and pain control. Patient can WBAT per Dr. Romeo Apple.      Hospital Course:   Pelvic Fracture -Following mechanical fall. -No heavy pain med requirements. Had significant sedation with narcotics. Will try and control pain with tylenol and ibuprofen for now. -Will go to SNF for ST-rehab purposes.  ??CAP -Has an infiltrate on CXR, but no symptoms. -Given her age and increased weakness will elect to treat with 5 days of augmentin.  Rest of chronic medical conditions are stable  Procedures:  None   Consultations:  Ortho, Dr. Romeo Apple  Discharge Instructions   Allergies as of 04/23/2017      Reactions   Hydrochlorothiazide Other (See Comments)   Reaction:  Dizziness    Lisinopril Cough   Other Other (See Comments)   Pt states that she is allergic to multiple BP meds -- does not recall the names.   Reaction:  Dizziness    Spironolactone Other (See Comments)   Reaction:  Blurred vision and sweating of feet       Medication List    STOP taking these medications   valsartan 320 MG tablet Commonly known as:  DIOVAN     TAKE these medications   albuterol 108 (90 Base) MCG/ACT inhaler Commonly known as:  PROVENTIL HFA;VENTOLIN HFA Inhale 1-2 puffs into the lungs every 6 (six) hours as needed  for wheezing or shortness of breath.   amLODipine 5 MG tablet Commonly known as:  NORVASC Take 5 mg by mouth daily.   amoxicillin-clavulanate 500-125 MG tablet Commonly known as:  AUGMENTIN Take 1 tablet (500 mg total) by mouth 2 (two) times daily.   aspirin EC 325 MG tablet Take 325 mg by mouth daily.   atenolol 50 MG tablet Commonly known as:  TENORMIN Take 50 mg by mouth daily.   budesonide-formoterol 160-4.5 MCG/ACT  inhaler Commonly known as:  SYMBICORT Inhale 2 puffs into the lungs 2 (two) times daily.   MULTI-VITAMINS Tabs Take 1 tablet by mouth daily.   nitroGLYCERIN 0.4 MG SL tablet Commonly known as:  NITROSTAT Place 1 tablet under the tongue as directed. Place 1 tablet under the tongue every five minutes as needed for emergency chest pain   omega-3 acid ethyl esters 1 g capsule Commonly known as:  LOVAZA Take 1 g by mouth daily.   omeprazole 40 MG capsule Commonly known as:  PRILOSEC Take 40 mg by mouth daily as needed (for acid reflux).   timolol 0.5 % ophthalmic solution Commonly known as:  BETIMOL Place 1 drop into both eyes 2 (two) times daily.   vitamin C 500 MG tablet Commonly known as:  ASCORBIC ACID Take 500 mg by mouth daily.   vitamin E 400 UNIT capsule Take 400 Units by mouth daily.      Allergies  Allergen Reactions  . Hydrochlorothiazide Other (See Comments)    Reaction:  Dizziness   . Lisinopril Cough  . Other Other (See Comments)    Pt states that she is allergic to multiple BP meds -- does not recall the names.   Reaction:  Dizziness   . Spironolactone Other (See Comments)    Reaction:  Blurred vision and sweating of feet    Contact information for after-discharge care    Destination    [facility] SN[facility] Follow up.   Service:  Skilled Nursing Contact information: [address] [place] [address] [phone]               The results of significant diagnostics from this hospitalization (including imaging, microbiology, ancillary and laboratory) are listed below for reference.    Significant Diagnostic Studies: Dg Chest 1 View  Result Date: 04/22/2017 CLINICAL DATA:  Larey Seat backwards onto floor. EXAM: CHEST 1 VIEW COMPARISON:  02/28/2017 FINDINGS: The cardio pericardial silhouette is enlarged. Interstitial markings are diffusely coarsened with chronic features. Clustered nodularity in the right apex is new in the  interval. No edema or pleural effusion. Chronic posttraumatic deformity noted proximal right humerus. Telemetry leads overlie the chest. IMPRESSION: Cardiomegaly with hyperexpansion and chronic interstitial changes. Clustered nodularity in the right apex is new in the interval. This may be related to bronchopneumonia or atypical infection. CT chest without contrast could be used to further evaluate as clinically warranted. Electronically Signed   By: Kennith Center M.D.   On: 04/22/2017 19:06   Dg Elbow 2 Views Right  Result Date: 04/22/2017 CLINICAL DATA:  Fall with bruising and swelling to right elbow. Initial encounter. EXAM: RIGHT ELBOW - 2 VIEW COMPARISON:  None. FINDINGS: There is no evidence of fracture, dislocation, or joint effusion. There is no evidence of arthropathy or other focal bone abnormality. Soft tissues are unremarkable. IMPRESSION: Negative. Electronically Signed   By: Irish Lack M.D.   On: 04/22/2017 19:14   Ct Head Wo Contrast  Result Date: 04/22/2017 CLINICAL DATA:  Status  post fall backwards. EXAM: CT HEAD WITHOUT CONTRAST CT CERVICAL SPINE WITHOUT CONTRAST TECHNIQUE: Multidetector CT imaging of the head and cervical spine was performed following the standard protocol without intravenous contrast. Multiplanar CT image reconstructions of the cervical spine were also generated. COMPARISON:  05/13/2016 FINDINGS: CT HEAD FINDINGS Brain: No evidence of acute infarction, hemorrhage, hydrocephalus, extra-axial collection or mass lesion/mass effect. Moderate brain parenchymal atrophy and deep white matter microangiopathy. Vascular: Calcific atherosclerotic disease at the skull base. Skull: Normal. Negative for fracture or focal lesion. Sinuses/Orbits: No acute finding. Other: None. CT CERVICAL SPINE FINDINGS Alignment: Straightening of cervical lordosis. Skull base and vertebrae: No acute fracture. No primary bone lesion or focal pathologic process. Soft tissues and spinal canal: No  prevertebral fluid or swelling. No visible canal hematoma. Disc levels:  Multilevel osteoarthritic changes. Upper chest: Negative. Other: None. IMPRESSION: No acute intracranial abnormality. Atrophy, chronic microvascular disease. No evidence of acute traumatic injury to the cervical spine. Straightening of the cervical lordosis, a osteoarthritic changes. Electronically Signed   By: Ted Mcalpineobrinka  Dimitrova M.D.   On: 04/22/2017 19:38   Ct Cervical Spine Wo Contrast  Result Date: 04/22/2017 CLINICAL DATA:  Status post fall backwards. EXAM: CT HEAD WITHOUT CONTRAST CT CERVICAL SPINE WITHOUT CONTRAST TECHNIQUE: Multidetector CT imaging of the head and cervical spine was performed following the standard protocol without intravenous contrast. Multiplanar CT image reconstructions of the cervical spine were also generated. COMPARISON:  05/13/2016 FINDINGS: CT HEAD FINDINGS Brain: No evidence of acute infarction, hemorrhage, hydrocephalus, extra-axial collection or mass lesion/mass effect. Moderate brain parenchymal atrophy and deep white matter microangiopathy. Vascular: Calcific atherosclerotic disease at the skull base. Skull: Normal. Negative for fracture or focal lesion. Sinuses/Orbits: No acute finding. Other: None. CT CERVICAL SPINE FINDINGS Alignment: Straightening of cervical lordosis. Skull base and vertebrae: No acute fracture. No primary bone lesion or focal pathologic process. Soft tissues and spinal canal: No prevertebral fluid or swelling. No visible canal hematoma. Disc levels:  Multilevel osteoarthritic changes. Upper chest: Negative. Other: None. IMPRESSION: No acute intracranial abnormality. Atrophy, chronic microvascular disease. No evidence of acute traumatic injury to the cervical spine. Straightening of the cervical lordosis, a osteoarthritic changes. Electronically Signed   By: Ted Mcalpineobrinka  Dimitrova M.D.   On: 04/22/2017 19:38   Ct Thoracic Spine Wo Contrast  Result Date: 04/22/2017 CLINICAL DATA:   Status post fall, back pain. EXAM: CT THORACIC and LUMBAR SPINE WITHOUT CONTRAST TECHNIQUE: Multidetector CT imaging of the thoracic and lumbar spine was performed without intravenous contrast. Multiplanar CT image reconstructions were also generated. COMPARISON:  Plain film of the lumbar spine dated 05/13/2016 FINDINGS: CT THORACIC SPINE FINDINGS Alignment: Prominent dextroscoliosis of the lower thoracic spine. No evidence of acute vertebral body subluxation. Vertebrae: No fracture line or displaced fracture fragment. Chronic compression fracture deformity of the T12 vertebral body. Paraspinal and other soft tissues: Aortic atherosclerosis. Cholelithiasis. No acute findings within the visualized paravertebral soft tissues. Disc levels: Mild degenerative change within the lower thoracic spine. No more than mild central canal stenosis at any level. CT LUMBAR SPINE FINDINGS Segmentation: 5 lumbar type vertebrae. Alignment: Prominent levoscoliosis centered at the L3 vertebral body level. No evidence of acute vertebral body subluxation. Vertebrae: No fracture line or displaced fracture fragment identified. Upper sacrum appears intact and normally aligned. Paraspinal and other soft tissues: Aortic atherosclerosis. Cholelithiasis. No acute findings seen within the visualized paravertebral soft tissues. Disc levels: Disc desiccations throughout the mid and lower cervical spine, mild to moderate in degree, with associated mild to  moderate central canal stenoses at multiple levels. IMPRESSION: 1. No evidence of acute fracture or acute subluxation within the thoracic or lumbar spine. 2. Prominent scoliosis of the thoracic and lumbar spine. 3. Chronic compression deformity of the T12 vertebral body. 4. Mild degenerative change within the thoracic spine. Mild to moderate degenerative change within the lumbar spine. 5. Aortic atherosclerosis. 6. Cholelithiasis. Electronically Signed   By: Bary Richard M.D.   On: 04/22/2017  20:10   Ct Lumbar Spine Wo Contrast  Result Date: 04/22/2017 CLINICAL DATA:  Status post fall, back pain. EXAM: CT THORACIC and LUMBAR SPINE WITHOUT CONTRAST TECHNIQUE: Multidetector CT imaging of the thoracic and lumbar spine was performed without intravenous contrast. Multiplanar CT image reconstructions were also generated. COMPARISON:  Plain film of the lumbar spine dated 05/13/2016 FINDINGS: CT THORACIC SPINE FINDINGS Alignment: Prominent dextroscoliosis of the lower thoracic spine. No evidence of acute vertebral body subluxation. Vertebrae: No fracture line or displaced fracture fragment. Chronic compression fracture deformity of the T12 vertebral body. Paraspinal and other soft tissues: Aortic atherosclerosis. Cholelithiasis. No acute findings within the visualized paravertebral soft tissues. Disc levels: Mild degenerative change within the lower thoracic spine. No more than mild central canal stenosis at any level. CT LUMBAR SPINE FINDINGS Segmentation: 5 lumbar type vertebrae. Alignment: Prominent levoscoliosis centered at the L3 vertebral body level. No evidence of acute vertebral body subluxation. Vertebrae: No fracture line or displaced fracture fragment identified. Upper sacrum appears intact and normally aligned. Paraspinal and other soft tissues: Aortic atherosclerosis. Cholelithiasis. No acute findings seen within the visualized paravertebral soft tissues. Disc levels: Disc desiccations throughout the mid and lower cervical spine, mild to moderate in degree, with associated mild to moderate central canal stenoses at multiple levels. IMPRESSION: 1. No evidence of acute fracture or acute subluxation within the thoracic or lumbar spine. 2. Prominent scoliosis of the thoracic and lumbar spine. 3. Chronic compression deformity of the T12 vertebral body. 4. Mild degenerative change within the thoracic spine. Mild to moderate degenerative change within the lumbar spine. 5. Aortic atherosclerosis. 6.  Cholelithiasis. Electronically Signed   By: Bary Richard M.D.   On: 04/22/2017 20:10   Ct Abdomen Pelvis W Contrast  Result Date: 04/22/2017 CLINICAL DATA:  Fall with pelvic pain and bilateral pubic rami fractures by x-ray. EXAM: CT ABDOMEN AND PELVIS WITH CONTRAST TECHNIQUE: Multidetector CT imaging of the abdomen and pelvis was performed using the standard protocol following bolus administration of intravenous contrast. CONTRAST:  50mL ISOVUE-300 IOPAMIDOL (ISOVUE-300) INJECTION 61% COMPARISON:  Pelvic and bilateral hip x-rays earlier today. Lumbar spine radiographs on 05/13/2016 FINDINGS: Lower chest: Mild scarring at both lung bases. 5 mm calcified granuloma at the left lung base. Hepatobiliary: No hepatic injury or perihepatic hematoma. No patent masses identified. There are some layering calcified gallstones within the gallbladder lumen. No biliary ductal dilatation. Pancreas: Unremarkable. No pancreatic ductal dilatation or surrounding inflammatory changes. Spleen: No splenic injury or perisplenic hematoma. Adrenals/Urinary Tract: Adrenal glands are unremarkable. Kidneys are normal, without renal calculi, focal lesion, or hydronephrosis. No evidence of renal injury. Bladder is distended and without visible gross injury. Stomach/Bowel: Bowel shows no evidence of obstruction, inflammation or perforation. Vascular/Lymphatic: No significant vascular findings are present. No enlarged abdominal or pelvic lymph nodes. Reproductive: Uterus and bilateral adnexa are unremarkable. Other: No free fluid, hemoperitoneum or hernia. Musculoskeletal: Bilateral superior and inferior pubic rami fractures are present demonstrating mild displacement. No diastasis at the level of the pubic symphysis. No hip fracture or other pelvic fracture  identified. There is some associated extraperitoneal hemorrhage adjacent to the pubic rami fractures. Compression deformity of the T12 vertebral body was present on prior lumbar  radiographs from 2018 and appears similar with approximately 40-50% loss of vertebral body height. IMPRESSION: 1. Bilateral superior and inferior pubic rami fractures with some associated surrounding extraperitoneal hemorrhage. No evidence of gross bladder injury or other pelvic fractures. 2. Old compression fracture of the T12 vertebral body appears similar to lumbar radiographs on 05/13/2016 Electronically Signed   By: Irish Lack M.D.   On: 04/22/2017 19:42   Dg Hand 2 View Left  Result Date: 04/22/2017 CLINICAL DATA:  Fall with bruising left hand.  Initial encounter. EXAM: LEFT HAND - 2 VIEW COMPARISON:  None. FINDINGS: No visible fracture or dislocation. Soft tissues are unremarkable. No bony lesions. IMPRESSION: No acute fracture identified. Electronically Signed   By: Irish Lack M.D.   On: 04/22/2017 19:15   Dg Humerus Left  Result Date: 04/02/2017 CLINICAL DATA:  Left arm pain after fall today. EXAM: LEFT HUMERUS - 2+ VIEW COMPARISON:  Radiographs of February 28, 2017. FINDINGS: There is no evidence of fracture or other focal bone lesions. Soft tissues are unremarkable. IMPRESSION: Normal left humerus. Electronically Signed   By: Lupita Raider, M.D.   On: 04/02/2017 15:25   Dg Hips Bilat W Or Wo Pelvis 2 Views  Result Date: 04/22/2017 CLINICAL DATA:  Fall with bilateral hip pain.  Initial encounter. EXAM: DG HIP (WITH OR WITHOUT PELVIS) 2V BILAT COMPARISON:  05/13/2016 FINDINGS: There are bilateral superior and inferior pubic rami fractures. The right-sided fractures show slightly more displacement compared to left-sided fractures. No overt diastasis of the pubic symphysis. Both hips show normal alignment and no evidence of acute fracture. IMPRESSION: Bilateral superior and inferior pubic rami fractures with slightly more displacement of right-sided fractures compared to the left. Electronically Signed   By: Irish Lack M.D.   On: 04/22/2017 19:10    Microbiology: No results  found for this or any previous visit (from the past 240 hour(s)).   Labs: Basic Metabolic Panel: Recent Labs  Lab 04/22/17 1816 04/22/17 1934  NA 136 132*  K 4.5 4.4  CL 100* 100*  CO2  --  22  GLUCOSE 117* 117*  BUN 16 17  CREATININE 0.60 0.58  CALCIUM  --  8.7*   Liver Function Tests: No results for input(s): AST, ALT, ALKPHOS, BILITOT, PROT, ALBUMIN in the last 168 hours. No results for input(s): LIPASE, AMYLASE in the last 168 hours. No results for input(s): AMMONIA in the last 168 hours. CBC: Recent Labs  Lab 04/22/17 1816 04/22/17 1934  WBC  --  7.5  NEUTROABS  --  4.7  HGB 12.9 11.9*  HCT 38.0 36.5  MCV  --  97.9  PLT  --  292   Cardiac Enzymes: No results for input(s): CKTOTAL, CKMB, CKMBINDEX, TROPONINI in the last 168 hours. BNP: BNP (last 3 results) No results for input(s): BNP in the last 8760 hours.  ProBNP (last 3 results) No results for input(s): PROBNP in the last 8760 hours.  CBG: No results for input(s): GLUCAP in the last 168 hours.     Signed:  Chaya Jan  [facility] Pager: [phone] 04/23/2017, 2:09 PM

## 2017-04-26 ENCOUNTER — Encounter (HOSPITAL_COMMUNITY)
Admission: RE | Admit: 2017-04-26 | Discharge: 2017-04-26 | Disposition: A | Discharge status: Home / Self Care | Payer: Medicare (Managed Care) | Source: Skilled Nursing Facility | Attending: Internal Medicine | Admitting: Internal Medicine

## 2017-04-26 ENCOUNTER — Non-Acute Institutional Stay (SKILLED_NURSING_FACILITY): Payer: Self-pay | Admitting: Internal Medicine

## 2017-04-26 ENCOUNTER — Encounter: Payer: Self-pay | Admitting: Internal Medicine

## 2017-04-26 DIAGNOSIS — J189 Pneumonia, unspecified organism: Secondary | ICD-10-CM

## 2017-04-26 DIAGNOSIS — N390 Urinary tract infection, site not specified: Secondary | ICD-10-CM | POA: Diagnosis not present

## 2017-04-26 DIAGNOSIS — D72829 Elevated white blood cell count, unspecified: Secondary | ICD-10-CM

## 2017-04-26 DIAGNOSIS — G20 Parkinson's disease: Secondary | ICD-10-CM | POA: Diagnosis not present

## 2017-04-26 DIAGNOSIS — S32592S Other specified fracture of left pubis, sequela: Secondary | ICD-10-CM

## 2017-04-26 DIAGNOSIS — I10 Essential (primary) hypertension: Secondary | ICD-10-CM

## 2017-04-26 DIAGNOSIS — J181 Lobar pneumonia, unspecified organism: Secondary | ICD-10-CM

## 2017-04-26 DIAGNOSIS — J410 Simple chronic bronchitis: Secondary | ICD-10-CM

## 2017-04-26 DIAGNOSIS — F0390 Unspecified dementia without behavioral disturbance: Secondary | ICD-10-CM | POA: Insufficient documentation

## 2017-04-26 DIAGNOSIS — Z9181 History of falling: Secondary | ICD-10-CM | POA: Diagnosis present

## 2017-04-26 DIAGNOSIS — R296 Repeated falls: Secondary | ICD-10-CM | POA: Insufficient documentation

## 2017-04-26 DIAGNOSIS — S32591S Other specified fracture of right pubis, sequela: Secondary | ICD-10-CM

## 2017-04-26 DIAGNOSIS — F0280 Dementia in other diseases classified elsewhere without behavioral disturbance: Secondary | ICD-10-CM

## 2017-04-26 LAB — CBC WITH DIFFERENTIAL/PLATELET
BASOS ABS: 0 10*3/uL (ref 0.0–0.1)
BASOS PCT: 0 %
EOS ABS: 0.1 10*3/uL (ref 0.0–0.7)
EOS PCT: 0 %
HCT: 36.9 % (ref 36.0–46.0)
Hemoglobin: 12 g/dL (ref 12.0–15.0)
LYMPHS PCT: 6 %
Lymphs Abs: 0.8 10*3/uL (ref 0.7–4.0)
MCH: 31.3 pg (ref 26.0–34.0)
MCHC: 32.5 g/dL (ref 30.0–36.0)
MCV: 96.3 fL (ref 78.0–100.0)
MONO ABS: 2.2 10*3/uL — AB (ref 0.1–1.0)
Monocytes Relative: 15 %
Neutro Abs: 11.5 10*3/uL — ABNORMAL HIGH (ref 1.7–7.7)
Neutrophils Relative %: 79 %
PLATELETS: 277 10*3/uL (ref 150–400)
RBC: 3.83 MIL/uL — AB (ref 3.87–5.11)
RDW: 13 % (ref 11.5–15.5)
WBC: 14.7 10*3/uL — AB (ref 4.0–10.5)

## 2017-04-26 LAB — BASIC METABOLIC PANEL
ANION GAP: 11 (ref 5–15)
BUN: 18 mg/dL (ref 6–20)
CALCIUM: 8.6 mg/dL — AB (ref 8.9–10.3)
CO2: 22 mmol/L (ref 22–32)
Chloride: 101 mmol/L (ref 101–111)
Creatinine, Ser: 0.51 mg/dL (ref 0.44–1.00)
Glucose, Bld: 111 mg/dL — ABNORMAL HIGH (ref 65–99)
POTASSIUM: 3.6 mmol/L (ref 3.5–5.1)
SODIUM: 134 mmol/L — AB (ref 135–145)

## 2017-04-26 NOTE — Progress Notes (Signed)
Location:   [facility] Room Number: 42/W Place of Service:  SNF [phone]) Provider:  Clayborne Dana, NP  Patient Care Team: Estevan Oaks, NP as PCP - General (Nurse Practitioner)  Extended Emergency Contact Information Primary Emergency Contact: Brogdon,[name] Address: [address]          [place], [place] [address] [place] Home Phone: [phone] Relation: Spouse Secondary Emergency Contact: Scull,[name] Address: [address]          [place], [place] [address] [place] Home Phone: [phone] Mobile Phone: [phone] Relation: Son  Code Status:  DNR Goals of care: Advanced Directive information Advanced Directives 04/26/2017  Does Patient Have a Medical Advance Directive? Yes  Type of Advance Directive Out of facility DNR (pink MOST or yellow form)  Does patient want to make changes to medical advance directive? No - Patient declined  Copy of Healthcare Power of Attorney in Chart? No - copy requested  Would patient like information on creating a medical advance directive? -  Pre-existing out of facility DNR order (yellow form or pink MOST form) -     Chief Complaint  Patient presents with  . New Admit To SNF    New Admission Visit    HPI:  Pt is a 84 y.o. female seen today for medical management of chronic diseases after being in hospital from 01/10-01/11  Patient has a history of hypertension, Parkinson disease with hallucinations, recurrent falls, COPD, Recent Humerus fracture 09/18, Low back pain due to compression Fracture, Osteoporosis, Primary open angle Glaucoma Patient is to give me any history.  She lives with her husband and her son at home.  She is a part [facility].  It seems like she fell at home while reaching into a cabinet.  She was found to have  bilateral superior and inferior pubic rami fractures with some associated surrounding extraperitoneal hemorrhage.  All her  other X-rays were negative.  Due to her history of dementia and Marita Snellen it was managed conservatively   Therapy and pain control.  Patient can be WBAT. Per Notes by [facility] patient has been having problems with falls at home.  Family has been resisting long-term care facility.  She has been requiring more assistance at home with her ADLs. Patient was unable to give me any history.  She talks in [place] in between with [place].  She did complain of pain in her right side mostly around the pelvic area.    Past Medical History:  Diagnosis Date  . Arthritis    osteoporosis  . Back pain   . COPD (chronic obstructive pulmonary disease) (HCC)   . Gait instability   . Glaucoma   . Glaucoma   . Hypertension   . Neck pain   . Parkinson's disease (HCC)   . Tremor    Past Surgical History:  Procedure Laterality Date  . CATARACT EXTRACTION    . EYE SURGERY    . TONSILLECTOMY    . TUBAL LIGATION      Allergies  Allergen Reactions  . Hydrochlorothiazide Other (See Comments)    Reaction:  Dizziness   . Lisinopril Cough  . Other Other (See Comments)    Pt states that she is allergic to multiple BP meds -- does not recall the names.   Reaction:  Dizziness   . Spironolactone Other (See Comments)    Reaction:  Blurred vision and sweating of feet  Outpatient Encounter Medications as of 04/26/2017  Medication Sig  . acetaminophen (TYLENOL) 325 MG tablet Take 650 mg by mouth every 6 (six) hours as needed.  Marland Kitchen. albuterol (PROVENTIL HFA;VENTOLIN HFA) 108 (90 Base) MCG/ACT inhaler Inhale 1-2 puffs into the lungs every 6 (six) hours as needed for wheezing or shortness of breath.  Marland Kitchen. amLODipine (NORVASC) 5 MG tablet Take 5 mg by mouth daily.   Marland Kitchen. amoxicillin-clavulanate (AUGMENTIN) 500-125 MG tablet Take 1 tablet (500 mg total) by mouth 2 (two) times daily.  Marland Kitchen. aspirin EC 325 MG tablet Take 325 mg by mouth daily.  Marland Kitchen. atenolol (TENORMIN) 50 MG tablet Take 50 mg by mouth daily.   . budesonide-formoterol  (SYMBICORT) 160-4.5 MCG/ACT inhaler Inhale 2 puffs into the lungs 2 (two) times daily.  . Multiple Vitamin (MULTI-VITAMINS) TABS Take 1 tablet by mouth daily.  . nitroGLYCERIN (NITROSTAT) 0.4 MG SL tablet Place 1 tablet under the tongue as directed. Place 1 tablet under the tongue every five minutes as needed for emergency chest pain  . omega-3 acid ethyl esters (LOVAZA) 1 g capsule Take 1 g by mouth daily.  Marland Kitchen. omeprazole (PRILOSEC) 40 MG capsule Take 40 mg by mouth daily as needed (for acid reflux).   . timolol (BETIMOL) 0.5 % ophthalmic solution Place 1 drop into both eyes 2 (two) times daily.  . vitamin C (ASCORBIC ACID) 500 MG tablet Take 500 mg by mouth daily.  . vitamin E 400 UNIT capsule Take 400 Units by mouth daily.   No facility-administered encounter medications on file as of 04/26/2017.      Review of Systems  Unable to perform ROS: Dementia     There is no immunization history on file for this patient. Pertinent  Health Maintenance Due  Topic Date Due  . INFLUENZA VACCINE  05/27/2017 (Originally 11/11/2016)  . DEXA SCAN  05/27/2017 (Originally 01/02/1996)  . PNA vac Low Risk Adult (1 of 2 - PCV13) 05/27/2017 (Originally 01/02/1996)   Fall Risk  07/18/2015 03/19/2015 02/12/2015  Falls in the past year? Yes No Yes  Number falls in past yr: 1 - 2 or more  Injury with Fall? No - Yes  Comment - - left lower leg   Risk for fall due to : (No Data) - -  Risk for fall due to: Comment accidently hit by door that pushed her down - -   Functional Status Survey:    There were no vitals filed for this visit. There is no height or weight on file to calculate BMI. Physical Exam  Constitutional: She appears well-developed and well-nourished.  HENT:  Head: Normocephalic.  Mouth/Throat: Oropharynx is clear and moist.  Eyes: Pupils are equal, round, and reactive to light.  Neck: Neck supple.  Cardiovascular: Normal rate, regular rhythm and normal heart sounds.  No murmur  heard. Pulmonary/Chest: Effort normal and breath sounds normal. No respiratory distress. She has no wheezes. She has no rales.  Abdominal: Soft. Bowel sounds are normal. She exhibits no distension. There is no tenderness. There is no rebound.  Musculoskeletal: She exhibits no edema.  Lymphadenopathy:    She has no cervical adenopathy.  Neurological: She is alert.  [place] did not know the Date and year or Name of the place. Will follow some commands. Was unable to check LE due to pain.  Has tremors in her UE and increased Rigidity   Skin: Skin is warm and dry.  Psychiatric:  Was  unable to assess    Labs reviewed: Recent Labs    04/22/17 1816 04/22/17 1934 04/26/17 0430  NA 136 132* 134*  K 4.5 4.4 3.6  CL 100* 100* 101  CO2  --  22 22  GLUCOSE 117* 117* 111*  BUN 16 17 18   CREATININE 0.60 0.58 0.51  CALCIUM  --  8.7* 8.6*   No results for input(s): AST, ALT, ALKPHOS, BILITOT, PROT, ALBUMIN in the last 8760 hours. Recent Labs    04/22/17 1816 04/22/17 1934 04/26/17 0430  WBC  --  7.5 14.7*  NEUTROABS  --  4.7 11.5*  HGB 12.9 11.9* 12.0  HCT 38.0 36.5 36.9  MCV  --  97.9 96.3  PLT  --  292 277   Lab Results  Component Value Date   TSH 1.453 08/21/2015   No results found for: HGBA1C No results found for: CHOL, HDL, LDLCALC, LDLDIRECT, TRIG, CHOLHDL  Significant Diagnostic Results in last 30 days:  Dg Chest 1 View  Result Date: 04/22/2017 CLINICAL DATA:  Larey Seat backwards onto floor. EXAM: CHEST 1 VIEW COMPARISON:  02/28/2017 FINDINGS: The cardio pericardial silhouette is enlarged. Interstitial markings are diffusely coarsened with chronic features. Clustered nodularity in the right apex is new in the interval. No edema or pleural effusion. Chronic posttraumatic deformity noted proximal right humerus. Telemetry leads overlie the chest. IMPRESSION: Cardiomegaly with hyperexpansion and chronic interstitial changes. Clustered  nodularity in the right apex is new in the interval. This may be related to bronchopneumonia or atypical infection. CT chest without contrast could be used to further evaluate as clinically warranted. Electronically Signed   By: Kennith Center M.D.   On: 04/22/2017 19:06   Dg Elbow 2 Views Right  Result Date: 04/22/2017 CLINICAL DATA:  Fall with bruising and swelling to right elbow. Initial encounter. EXAM: RIGHT ELBOW - 2 VIEW COMPARISON:  None. FINDINGS: There is no evidence of fracture, dislocation, or joint effusion. There is no evidence of arthropathy or other focal bone abnormality. Soft tissues are unremarkable. IMPRESSION: Negative. Electronically Signed   By: Irish Lack M.D.   On: 04/22/2017 19:14   Ct Head Wo Contrast  Result Date: 04/22/2017 CLINICAL DATA:  Status post fall backwards. EXAM: CT HEAD WITHOUT CONTRAST CT CERVICAL SPINE WITHOUT CONTRAST TECHNIQUE: Multidetector CT imaging of the head and cervical spine was performed following the standard protocol without intravenous contrast. Multiplanar CT image reconstructions of the cervical spine were also generated. COMPARISON:  05/13/2016 FINDINGS: CT HEAD FINDINGS Brain: No evidence of acute infarction, hemorrhage, hydrocephalus, extra-axial collection or mass lesion/mass effect. Moderate brain parenchymal atrophy and deep white matter microangiopathy. Vascular: Calcific atherosclerotic disease at the skull base. Skull: Normal. Negative for fracture or focal lesion. Sinuses/Orbits: No acute finding. Other: None. CT CERVICAL SPINE FINDINGS Alignment: Straightening of cervical lordosis. Skull base and vertebrae: No acute fracture. No primary bone lesion or focal pathologic process. Soft tissues and spinal canal: No prevertebral fluid or swelling. No visible canal hematoma. Disc levels:  Multilevel osteoarthritic changes. Upper chest: Negative. Other: None. IMPRESSION: No acute intracranial abnormality. Atrophy, chronic microvascular disease.  No evidence of acute traumatic injury to the cervical spine. Straightening of the cervical lordosis, a osteoarthritic changes. Electronically Signed   By: Ted Mcalpine M.D.   On: 04/22/2017 19:38   Ct Cervical Spine Wo Contrast  Result Date: 04/22/2017 CLINICAL DATA:  Status post fall backwards. EXAM: CT HEAD WITHOUT CONTRAST CT CERVICAL SPINE WITHOUT CONTRAST TECHNIQUE: Multidetector CT imaging of the head and  cervical spine was performed following the standard protocol without intravenous contrast. Multiplanar CT image reconstructions of the cervical spine were also generated. COMPARISON:  05/13/2016 FINDINGS: CT HEAD FINDINGS Brain: No evidence of acute infarction, hemorrhage, hydrocephalus, extra-axial collection or mass lesion/mass effect. Moderate brain parenchymal atrophy and deep white matter microangiopathy. Vascular: Calcific atherosclerotic disease at the skull base. Skull: Normal. Negative for fracture or focal lesion. Sinuses/Orbits: No acute finding. Other: None. CT CERVICAL SPINE FINDINGS Alignment: Straightening of cervical lordosis. Skull base and vertebrae: No acute fracture. No primary bone lesion or focal pathologic process. Soft tissues and spinal canal: No prevertebral fluid or swelling. No visible canal hematoma. Disc levels:  Multilevel osteoarthritic changes. Upper chest: Negative. Other: None. IMPRESSION: No acute intracranial abnormality. Atrophy, chronic microvascular disease. No evidence of acute traumatic injury to the cervical spine. Straightening of the cervical lordosis, a osteoarthritic changes. Electronically Signed   By: Ted Mcalpine M.D.   On: 04/22/2017 19:38   Ct Thoracic Spine Wo Contrast  Result Date: 04/22/2017 CLINICAL DATA:  Status post fall, back pain. EXAM: CT THORACIC and LUMBAR SPINE WITHOUT CONTRAST TECHNIQUE: Multidetector CT imaging of the thoracic and lumbar spine was performed without intravenous contrast. Multiplanar CT image reconstructions  were also generated. COMPARISON:  Plain film of the lumbar spine dated 05/13/2016 FINDINGS: CT THORACIC SPINE FINDINGS Alignment: Prominent dextroscoliosis of the lower thoracic spine. No evidence of acute vertebral body subluxation. Vertebrae: No fracture line or displaced fracture fragment. Chronic compression fracture deformity of the T12 vertebral body. Paraspinal and other soft tissues: Aortic atherosclerosis. Cholelithiasis. No acute findings within the visualized paravertebral soft tissues. Disc levels: Mild degenerative change within the lower thoracic spine. No more than mild central canal stenosis at any level. CT LUMBAR SPINE FINDINGS Segmentation: 5 lumbar type vertebrae. Alignment: Prominent levoscoliosis centered at the L3 vertebral body level. No evidence of acute vertebral body subluxation. Vertebrae: No fracture line or displaced fracture fragment identified. Upper sacrum appears intact and normally aligned. Paraspinal and other soft tissues: Aortic atherosclerosis. Cholelithiasis. No acute findings seen within the visualized paravertebral soft tissues. Disc levels: Disc desiccations throughout the mid and lower cervical spine, mild to moderate in degree, with associated mild to moderate central canal stenoses at multiple levels. IMPRESSION: 1. No evidence of acute fracture or acute subluxation within the thoracic or lumbar spine. 2. Prominent scoliosis of the thoracic and lumbar spine. 3. Chronic compression deformity of the T12 vertebral body. 4. Mild degenerative change within the thoracic spine. Mild to moderate degenerative change within the lumbar spine. 5. Aortic atherosclerosis. 6. Cholelithiasis. Electronically Signed   By: Bary Richard M.D.   On: 04/22/2017 20:10   Ct Lumbar Spine Wo Contrast  Result Date: 04/22/2017 CLINICAL DATA:  Status post fall, back pain. EXAM: CT THORACIC and LUMBAR SPINE WITHOUT CONTRAST TECHNIQUE: Multidetector CT imaging of the thoracic and lumbar spine was  performed without intravenous contrast. Multiplanar CT image reconstructions were also generated. COMPARISON:  Plain film of the lumbar spine dated 05/13/2016 FINDINGS: CT THORACIC SPINE FINDINGS Alignment: Prominent dextroscoliosis of the lower thoracic spine. No evidence of acute vertebral body subluxation. Vertebrae: No fracture line or displaced fracture fragment. Chronic compression fracture deformity of the T12 vertebral body. Paraspinal and other soft tissues: Aortic atherosclerosis. Cholelithiasis. No acute findings within the visualized paravertebral soft tissues. Disc levels: Mild degenerative change within the lower thoracic spine. No more than mild central canal stenosis at any level. CT LUMBAR SPINE FINDINGS Segmentation: 5 lumbar type vertebrae. Alignment: Prominent  levoscoliosis centered at the L3 vertebral body level. No evidence of acute vertebral body subluxation. Vertebrae: No fracture line or displaced fracture fragment identified. Upper sacrum appears intact and normally aligned. Paraspinal and other soft tissues: Aortic atherosclerosis. Cholelithiasis. No acute findings seen within the visualized paravertebral soft tissues. Disc levels: Disc desiccations throughout the mid and lower cervical spine, mild to moderate in degree, with associated mild to moderate central canal stenoses at multiple levels. IMPRESSION: 1. No evidence of acute fracture or acute subluxation within the thoracic or lumbar spine. 2. Prominent scoliosis of the thoracic and lumbar spine. 3. Chronic compression deformity of the T12 vertebral body. 4. Mild degenerative change within the thoracic spine. Mild to moderate degenerative change within the lumbar spine. 5. Aortic atherosclerosis. 6. Cholelithiasis. Electronically Signed   By: Bary Richard M.D.   On: 04/22/2017 20:10   Ct Abdomen Pelvis W Contrast  Result Date: 04/22/2017 CLINICAL DATA:  Fall with pelvic pain and bilateral pubic rami fractures by x-ray. EXAM: CT  ABDOMEN AND PELVIS WITH CONTRAST TECHNIQUE: Multidetector CT imaging of the abdomen and pelvis was performed using the standard protocol following bolus administration of intravenous contrast. CONTRAST:  50mL ISOVUE-300 IOPAMIDOL (ISOVUE-300) INJECTION 61% COMPARISON:  Pelvic and bilateral hip x-rays earlier today. Lumbar spine radiographs on 05/13/2016 FINDINGS: Lower chest: Mild scarring at both lung bases. 5 mm calcified granuloma at the left lung base. Hepatobiliary: No hepatic injury or perihepatic hematoma. No patent masses identified. There are some layering calcified gallstones within the gallbladder lumen. No biliary ductal dilatation. Pancreas: Unremarkable. No pancreatic ductal dilatation or surrounding inflammatory changes. Spleen: No splenic injury or perisplenic hematoma. Adrenals/Urinary Tract: Adrenal glands are unremarkable. Kidneys are normal, without renal calculi, focal lesion, or hydronephrosis. No evidence of renal injury. Bladder is distended and without visible gross injury. Stomach/Bowel: Bowel shows no evidence of obstruction, inflammation or perforation. Vascular/Lymphatic: No significant vascular findings are present. No enlarged abdominal or pelvic lymph nodes. Reproductive: Uterus and bilateral adnexa are unremarkable. Other: No free fluid, hemoperitoneum or hernia. Musculoskeletal: Bilateral superior and inferior pubic rami fractures are present demonstrating mild displacement. No diastasis at the level of the pubic symphysis. No hip fracture or other pelvic fracture identified. There is some associated extraperitoneal hemorrhage adjacent to the pubic rami fractures. Compression deformity of the T12 vertebral body was present on prior lumbar radiographs from 2018 and appears similar with approximately 40-50% loss of vertebral body height. IMPRESSION: 1. Bilateral superior and inferior pubic rami fractures with some associated surrounding extraperitoneal hemorrhage. No evidence of gross  bladder injury or other pelvic fractures. 2. Old compression fracture of the T12 vertebral body appears similar to lumbar radiographs on 05/13/2016 Electronically Signed   By: Irish Lack M.D.   On: 04/22/2017 19:42   Dg Hand 2 View Left  Result Date: 04/22/2017 CLINICAL DATA:  Fall with bruising left hand.  Initial encounter. EXAM: LEFT HAND - 2 VIEW COMPARISON:  None. FINDINGS: No visible fracture or dislocation. Soft tissues are unremarkable. No bony lesions. IMPRESSION: No acute fracture identified. Electronically Signed   By: Irish Lack M.D.   On: 04/22/2017 19:15   Dg Humerus Left  Result Date: 04/02/2017 CLINICAL DATA:  Left arm pain after fall today. EXAM: LEFT HUMERUS - 2+ VIEW COMPARISON:  Radiographs of February 28, 2017. FINDINGS: There is no evidence of fracture or other focal bone lesions. Soft tissues are unremarkable. IMPRESSION: Normal left humerus. Electronically Signed   By: Lupita Raider, M.D.   On:  04/02/2017 15:25   Dg Hips Bilat W Or Wo Pelvis 2 Views  Result Date: 04/22/2017 CLINICAL DATA:  Fall with bilateral hip pain.  Initial encounter. EXAM: DG HIP (WITH OR WITHOUT PELVIS) 2V BILAT COMPARISON:  05/13/2016 FINDINGS: There are bilateral superior and inferior pubic rami fractures. The right-sided fractures show slightly more displacement compared to left-sided fractures. No overt diastasis of the pubic symphysis. Both hips show normal alignment and no evidence of acute fracture. IMPRESSION: Bilateral superior and inferior pubic rami fractures with slightly more displacement of right-sided fractures compared to the left. Electronically Signed   By: Irish Lack M.D.   On: 04/22/2017 19:10    Assessment/Plan  Closed bilateral fracture of pubic rami At this time due to her frailty and dementia plan is to manage it with pain control and therapy W BAT per Ortho. We will continue Tylenol for pain control.  Patient has not tolerated narcotics in the  Right upper  lobe infiltrate with leukocytosis Patient was started on Augmentin in the hospital.  She is asymptomatic.  Does not have fever or chills but does now have leukocytosis We will continue Augmentin for a total of 7 days Patient also on oxygen was slowly tapered off Repeat x-ray in 4 weeks Check UA also  Parkinson's disease  Not on any treatment.  Seems like patient has not tolerated any aggressive treatment in the past. Continue therapy  Recurrent falls Related to gait impairment and Parkinson.  We will continue therapy  Dementia Patient does have cognitive impairment CT scan of head in the hospital did not show any acute disease Patient will have difficult time doing therapy Have discussed with the therapy Patient will need long-term placement  COPD On ProAir air and Symbicort   Essential hypertension Blood pressure controlled on Norvasc and metoprolol GERD Continue on Omeprazole     Family/ staff Communication:   Labs/tests ordered:    Total time spent in this patient care encounter was _45 minutes; greater than 50% of the visit spent counseling patient, reviewing records , Labs and coordinating care for problems addressed at this encounter.

## 2017-04-27 ENCOUNTER — Encounter (HOSPITAL_COMMUNITY)
Admission: RE | Admit: 2017-04-27 | Discharge: 2017-04-27 | Disposition: A | Discharge status: Home / Self Care | Payer: Medicare (Managed Care) | Source: Skilled Nursing Facility | Attending: *Deleted | Admitting: *Deleted

## 2017-04-27 DIAGNOSIS — G20 Parkinson's disease: Secondary | ICD-10-CM | POA: Diagnosis not present

## 2017-04-27 LAB — URINALYSIS, ROUTINE W REFLEX MICROSCOPIC
BILIRUBIN URINE: NEGATIVE
Bacteria, UA: NONE SEEN
GLUCOSE, UA: NEGATIVE mg/dL
HGB URINE DIPSTICK: NEGATIVE
Ketones, ur: 5 mg/dL — AB
NITRITE: NEGATIVE
PH: 6 (ref 5.0–8.0)
Protein, ur: 100 mg/dL — AB
SPECIFIC GRAVITY, URINE: 1.023 (ref 1.005–1.030)

## 2017-04-28 LAB — URINE CULTURE

## 2017-04-29 ENCOUNTER — Non-Acute Institutional Stay (SKILLED_NURSING_FACILITY): Payer: Self-pay | Admitting: Internal Medicine

## 2017-04-29 ENCOUNTER — Encounter: Payer: Self-pay | Admitting: Internal Medicine

## 2017-04-29 ENCOUNTER — Other Ambulatory Visit (HOSPITAL_COMMUNITY)
Admission: RE | Admit: 2017-04-29 | Discharge: 2017-04-29 | Disposition: A | Discharge status: Home / Self Care | Payer: Medicare (Managed Care) | Source: Other Acute Inpatient Hospital | Attending: Internal Medicine | Admitting: Internal Medicine

## 2017-04-29 DIAGNOSIS — D72829 Elevated white blood cell count, unspecified: Secondary | ICD-10-CM

## 2017-04-29 DIAGNOSIS — L03113 Cellulitis of right upper limb: Secondary | ICD-10-CM

## 2017-04-29 DIAGNOSIS — I10 Essential (primary) hypertension: Secondary | ICD-10-CM | POA: Diagnosis present

## 2017-04-29 LAB — CBC
HEMATOCRIT: 34 % — AB (ref 36.0–46.0)
HEMOGLOBIN: 10.8 g/dL — AB (ref 12.0–15.0)
MCH: 30.7 pg (ref 26.0–34.0)
MCHC: 31.8 g/dL (ref 30.0–36.0)
MCV: 96.6 fL (ref 78.0–100.0)
Platelets: 355 10*3/uL (ref 150–400)
RBC: 3.52 MIL/uL — ABNORMAL LOW (ref 3.87–5.11)
RDW: 13.2 % (ref 11.5–15.5)
WBC: 16.4 10*3/uL — ABNORMAL HIGH (ref 4.0–10.5)

## 2017-04-29 LAB — BASIC METABOLIC PANEL
ANION GAP: 11 (ref 5–15)
BUN: 28 mg/dL — ABNORMAL HIGH (ref 6–20)
CALCIUM: 8.9 mg/dL (ref 8.9–10.3)
CO2: 25 mmol/L (ref 22–32)
Chloride: 100 mmol/L — ABNORMAL LOW (ref 101–111)
Creatinine, Ser: 0.64 mg/dL (ref 0.44–1.00)
Glucose, Bld: 138 mg/dL — ABNORMAL HIGH (ref 65–99)
Potassium: 3.7 mmol/L (ref 3.5–5.1)
SODIUM: 136 mmol/L (ref 135–145)

## 2017-04-29 NOTE — Progress Notes (Signed)
This is an acute visit.  Level care skilled.  Facility is [facility].  Chief complaint-acute visit secondary to leukocytosis.  History of present illness.  Patient is a pleasant 84 year old female seen today for elevated white count.  She does have a history of hypertension Parkinson's disease with hallucinations and recurrent falls COPD and a recent humerus fracture in September 2018.  Also a history of back pain with compression fractures osteoporosis and primary open-angle glaucoma.  [facility].  She fell at home while reaching into a cabinet and had bilateral superior and inferior pubic rami fractures with a surrounding extraperitoneal hemorrhage.  It was managed conservatively.  She also was found to have a right upper lobe infiltrate with leukocytosis and was started on Augmentin which she is completing today.  -It was noted she had leukocytosis on lab done on January 14 at 14,700-Dr. Chales Abrahams did see her--in order an updated labs for today which shows white count is elevated somewhat up to 16,400.  Nursing staff also has noted apparently some increased warmth of the hematoma of her right elbow area with some increased erythema and tenderness.  Later in the day she did develop a temperature of 100.0 earlier had been afebrile.  Dr. Chales Abrahams did order a urine 2 days ago because of the elevated white count but that culture has come back showing insignificant growth.  Currently she does not appear to have any significant complaints however this is somewhat limitedsince she often speaks [place] between [place].       Past Medical History:  Diagnosis Date  . Arthritis    osteoporosis  . Back pain   . COPD (chronic obstructive pulmonary disease) (HCC)   . Gait instability   . Glaucoma   . Glaucoma   . Hypertension   . Neck pain   . Parkinson's disease (HCC)   . Tremor         Past Surgical History:  Procedure Laterality Date  .  CATARACT EXTRACTION    . EYE SURGERY    . TONSILLECTOMY    . TUBAL LIGATION           Allergies  Allergen Reactions  . Hydrochlorothiazide Other (See Comments)    Reaction:  Dizziness   . Lisinopril Cough  . Other Other (See Comments)    Pt states that she is allergic to multiple BP meds -- does not recall the names.   Reaction:  Dizziness   . Spironolactone Other (See Comments)    Reaction:  Blurred vision and sweating of feet             Sig  . acetaminophen (TYLENOL) 325 MG tablet Take 650 mg by mouth every 6 (six) hours as needed.  Marland Kitchen albuterol (PROVENTIL HFA;VENTOLIN HFA) 108 (90 Base) MCG/ACT inhaler Inhale 1-2 puffs into the lungs every 6 (six) hours as needed for wheezing or shortness of breath.  Marland Kitchen amLODipine (NORVASC) 5 MG tablet Take 5 mg by mouth daily.   Marland Kitchen amoxicillin-clavulanate (AUGMENTIN) 500-125 MG tablet Take 1 tablet (500 mg total) by mouth 2 (two) times daily.  Marland Kitchen aspirin EC 325 MG tablet Take 325 mg by mouth daily.  Marland Kitchen atenolol (TENORMIN) 50 MG tablet Take 50 mg by mouth daily.   . budesonide-formoterol (SYMBICORT) 160-4.5 MCG/ACT inhaler Inhale 2 puffs into the lungs 2 (two) times daily.  . Multiple Vitamin (MULTI-VITAMINS) TABS Take 1 tablet by mouth daily.  . nitroGLYCERIN (NITROSTAT) 0.4 MG SL tablet Place  1 tablet under the tongue as directed. Place 1 tablet under the tongue every five minutes as needed for emergency chest pain  . omega-3 acid ethyl esters (LOVAZA) 1 g capsule Take 1 g by mouth daily.  Marland Kitchen. omeprazole (PRILOSEC) 40 MG capsule Take 40 mg by mouth daily as needed (for acid reflux).   . timolol (BETIMOL) 0.5 % ophthalmic solution Place 1 drop into both eyes 2 (two) times daily.  . vitamin C (ASCORBIC ACID) 500 MG tablet Take 500 mg by mouth daily.  . vitamin E 400 UNIT capsule Take 400 Units by mouth daily.   No facility-administered encounter medications on file as of 04/26/2017.      Review of Systems  Again this is  limited secondary to patient's language difficulties and apparently some history of dementia- please see HPI    There is no immunization history on file for this patient.     Pertinent  Health Maintenance Due  Topic Date Due  . INFLUENZA VACCINE  05/27/2017 (Originally 11/11/2016)  . DEXA SCAN  05/27/2017 (Originally 01/02/1996)  . PNA vac Low Risk Adult (1 of 2 - PCV13) 05/27/2017 (Originally 01/02/1996)   Fall Risk  07/18/2015 03/19/2015 02/12/2015  Falls in the past year? Yes No Yes  Number falls in past yr: 1 - 2 or more  Injury with Fall? No - Yes  Comment - - left lower leg   Risk for fall due to : (No Data) - -  Risk for fall due to: Comment accidently hit by door that pushed her down -      Physical exam.  Temperature later today was up to 100.0 pulse 84 respirations 18 blood pressure 127/64 O2 saturation 93% on room air.  In general this is a pleasant elderly female in no distress lying comfortably in bed.  Her skin is warm and dry.  Oropharynx is clear mucous membranes moist.  Chest is clear to auscultation with somewhat shallow air entry there is no labored breathing.  Heart is regular rate and rhythm without murmur gallop or rub.  Abdomen is soft does not appear to be tender there are positive bowel sounds.  Musculoskeletal is able to move all 4 extremities she does appear to have a hematoma in the right elbow area there is some bruising here and some increased erythema and warmth The area is tender when palpated   -Neurologic appears grossly intact she is alert she does have upper extremity tremors.  As well as rigidity.  Psych she does follow simple verbal commands secondary to language difficulties somewhat difficult to fully assess.    Labs.  April 29, 2016.  WBC 16.4 hemoglobin 10.8 platelets 355.  Sodium 136 potassium 3.7 BUN 28 creatinine 0.64   Assessment and plan.  1.  Leukocytosis- this was discussed with the [facility] physician Dr. Mayford KnifeWilliams   Via phone----as well as with Dr. Chales AbrahamsGupta  --- patient also was assessed by Dr. Chales AbrahamsGupta-- At this point will start doxycycline 100 mg twice daily for 7 days for suspected cellulitis-question developing abscess- also will order a stat CBC with differential tomorrow.  Monitor closely with vital signs and pulse ox every shift as well.  -This plan was discussed with Dr. Chales AbrahamsGupta- as well as with Dr. Mayford KnifeWilliams via phone  Dr. Mayford KnifeWilliams stated  she will try to get up.  To look at patient tomorrow-it is my [facility]--  [phone]

## 2017-04-30 ENCOUNTER — Encounter (HOSPITAL_COMMUNITY)
Admission: RE | Admit: 2017-04-30 | Discharge: 2017-04-30 | Disposition: A | Discharge status: Home / Self Care | Payer: Medicare (Managed Care) | Source: Skilled Nursing Facility | Attending: Internal Medicine | Admitting: Internal Medicine

## 2017-04-30 DIAGNOSIS — G20 Parkinson's disease: Secondary | ICD-10-CM | POA: Diagnosis not present

## 2017-04-30 LAB — CBC WITH DIFFERENTIAL/PLATELET
BASOS ABS: 0.1 10*3/uL (ref 0.0–0.1)
BASOS PCT: 0 %
Eosinophils Absolute: 0.4 10*3/uL (ref 0.0–0.7)
Eosinophils Relative: 3 %
HEMATOCRIT: 30.5 % — AB (ref 36.0–46.0)
HEMOGLOBIN: 9.9 g/dL — AB (ref 12.0–15.0)
Lymphocytes Relative: 10 %
Lymphs Abs: 1.5 10*3/uL (ref 0.7–4.0)
MCH: 30.9 pg (ref 26.0–34.0)
MCHC: 32.5 g/dL (ref 30.0–36.0)
MCV: 95.3 fL (ref 78.0–100.0)
MONOS PCT: 21 %
Monocytes Absolute: 3.1 10*3/uL — ABNORMAL HIGH (ref 0.1–1.0)
NEUTROS ABS: 10 10*3/uL — AB (ref 1.7–7.7)
NEUTROS PCT: 66 %
Platelets: 346 10*3/uL (ref 150–400)
RBC: 3.2 MIL/uL — ABNORMAL LOW (ref 3.87–5.11)
RDW: 13 % (ref 11.5–15.5)
WBC: 15.1 10*3/uL — ABNORMAL HIGH (ref 4.0–10.5)

## 2017-05-01 ENCOUNTER — Encounter (HOSPITAL_COMMUNITY)
Admission: RE | Admit: 2017-05-01 | Discharge: 2017-05-01 | Disposition: A | Discharge status: Home / Self Care | Payer: Medicare (Managed Care) | Source: Skilled Nursing Facility | Attending: Internal Medicine | Admitting: Internal Medicine

## 2017-05-01 DIAGNOSIS — G20 Parkinson's disease: Secondary | ICD-10-CM | POA: Diagnosis not present

## 2017-05-01 LAB — CBC WITH DIFFERENTIAL/PLATELET
Basophils Absolute: 0.1 10*3/uL (ref 0.0–0.1)
Basophils Relative: 1 %
EOS PCT: 3 %
Eosinophils Absolute: 0.4 10*3/uL (ref 0.0–0.7)
HEMATOCRIT: 32.1 % — AB (ref 36.0–46.0)
HEMOGLOBIN: 10.3 g/dL — AB (ref 12.0–15.0)
LYMPHS PCT: 11 %
Lymphs Abs: 1.6 10*3/uL (ref 0.7–4.0)
MCH: 30.7 pg (ref 26.0–34.0)
MCHC: 32.1 g/dL (ref 30.0–36.0)
MCV: 95.8 fL (ref 78.0–100.0)
MONOS PCT: 15 %
Monocytes Absolute: 2.2 10*3/uL — ABNORMAL HIGH (ref 0.1–1.0)
NEUTROS PCT: 70 %
Neutro Abs: 10.4 10*3/uL — ABNORMAL HIGH (ref 1.7–7.7)
PLATELETS: 442 10*3/uL — AB (ref 150–400)
RBC: 3.35 MIL/uL — ABNORMAL LOW (ref 3.87–5.11)
RDW: 13.1 % (ref 11.5–15.5)
WBC: 14.7 10*3/uL — ABNORMAL HIGH (ref 4.0–10.5)

## 2017-05-10 ENCOUNTER — Other Ambulatory Visit (HOSPITAL_COMMUNITY)
Admission: AD | Admit: 2017-05-10 | Discharge: 2017-05-10 | Disposition: A | Discharge status: Home / Self Care | Payer: Medicare (Managed Care) | Source: Skilled Nursing Facility | Attending: Internal Medicine | Admitting: Internal Medicine

## 2017-05-10 DIAGNOSIS — A0472 Enterocolitis due to Clostridium difficile, not specified as recurrent: Secondary | ICD-10-CM | POA: Diagnosis present

## 2017-05-10 LAB — C DIFFICILE QUICK SCREEN W PCR REFLEX
C DIFFICILE (CDIFF) INTERP: NOT DETECTED
C Diff antigen: NEGATIVE
C Diff toxin: NEGATIVE

## 2017-05-11 ENCOUNTER — Encounter (HOSPITAL_COMMUNITY)
Admission: RE | Admit: 2017-05-11 | Discharge: 2017-05-11 | Disposition: A | Discharge status: Home / Self Care | Payer: Medicare (Managed Care) | Source: Skilled Nursing Facility | Attending: Internal Medicine | Admitting: Internal Medicine

## 2017-05-11 DIAGNOSIS — R799 Abnormal finding of blood chemistry, unspecified: Secondary | ICD-10-CM | POA: Insufficient documentation

## 2017-05-11 LAB — CBC WITH DIFFERENTIAL/PLATELET
BASOS ABS: 0.1 10*3/uL (ref 0.0–0.1)
Basophils Relative: 1 %
EOS ABS: 0.1 10*3/uL (ref 0.0–0.7)
EOS PCT: 1 %
HCT: 35.1 % — ABNORMAL LOW (ref 36.0–46.0)
HEMOGLOBIN: 11 g/dL — AB (ref 12.0–15.0)
LYMPHS ABS: 1.6 10*3/uL (ref 0.7–4.0)
LYMPHS PCT: 14 %
MCH: 30.1 pg (ref 26.0–34.0)
MCHC: 31.3 g/dL (ref 30.0–36.0)
MCV: 96.2 fL (ref 78.0–100.0)
Monocytes Absolute: 1.3 10*3/uL — ABNORMAL HIGH (ref 0.1–1.0)
Monocytes Relative: 12 %
NEUTROS PCT: 72 %
Neutro Abs: 8.2 10*3/uL — ABNORMAL HIGH (ref 1.7–7.7)
PLATELETS: 770 10*3/uL — AB (ref 150–400)
RBC: 3.65 MIL/uL — ABNORMAL LOW (ref 3.87–5.11)
RDW: 13.8 % (ref 11.5–15.5)
WBC: 11.3 10*3/uL — AB (ref 4.0–10.5)

## 2017-05-11 LAB — BASIC METABOLIC PANEL
ANION GAP: 10 (ref 5–15)
BUN: 16 mg/dL (ref 6–20)
CHLORIDE: 105 mmol/L (ref 101–111)
CO2: 22 mmol/L (ref 22–32)
Calcium: 8.6 mg/dL — ABNORMAL LOW (ref 8.9–10.3)
Creatinine, Ser: 0.62 mg/dL (ref 0.44–1.00)
GFR calc Af Amer: >60 mL/min (ref >60)
Glucose, Bld: 106 mg/dL — ABNORMAL HIGH (ref 65–99)
POTASSIUM: 3.4 mmol/L — AB (ref 3.5–5.1)
SODIUM: 137 mmol/L (ref 135–145)

## 2017-05-21 ENCOUNTER — Encounter (HOSPITAL_COMMUNITY)
Admission: RE | Admit: 2017-05-21 | Discharge: 2017-05-21 | Disposition: A | Discharge status: Home / Self Care | Payer: Medicare (Managed Care) | Source: Skilled Nursing Facility | Attending: Internal Medicine | Admitting: Internal Medicine

## 2017-05-21 DIAGNOSIS — Z4789 Encounter for other orthopedic aftercare: Secondary | ICD-10-CM | POA: Insufficient documentation

## 2017-05-21 DIAGNOSIS — I10 Essential (primary) hypertension: Secondary | ICD-10-CM | POA: Diagnosis not present

## 2017-05-21 DIAGNOSIS — S3282XD Multiple fractures of pelvis without disruption of pelvic ring, subsequent encounter for fracture with routine healing: Secondary | ICD-10-CM | POA: Diagnosis present

## 2017-05-21 DIAGNOSIS — A0472 Enterocolitis due to Clostridium difficile, not specified as recurrent: Secondary | ICD-10-CM | POA: Diagnosis present

## 2017-05-21 LAB — BASIC METABOLIC PANEL
Anion gap: 9 (ref 5–15)
BUN: 14 mg/dL (ref 6–20)
CHLORIDE: 98 mmol/L — AB (ref 101–111)
CO2: 25 mmol/L (ref 22–32)
Calcium: 8.6 mg/dL — ABNORMAL LOW (ref 8.9–10.3)
Creatinine, Ser: 0.64 mg/dL (ref 0.44–1.00)
GFR calc Af Amer: >60 mL/min (ref >60)
GLUCOSE: 126 mg/dL — AB (ref 65–99)
POTASSIUM: 4.4 mmol/L (ref 3.5–5.1)
SODIUM: 132 mmol/L — AB (ref 135–145)

## 2017-05-21 LAB — CBC
HCT: 37.2 % (ref 36.0–46.0)
Hemoglobin: 11.6 g/dL — ABNORMAL LOW (ref 12.0–15.0)
MCH: 29.7 pg (ref 26.0–34.0)
MCHC: 31.2 g/dL (ref 30.0–36.0)
MCV: 95.4 fL (ref 78.0–100.0)
PLATELETS: 354 10*3/uL (ref 150–400)
RBC: 3.9 MIL/uL (ref 3.87–5.11)
RDW: 14.8 % (ref 11.5–15.5)
WBC: 7.4 10*3/uL (ref 4.0–10.5)

## 2017-06-15 ENCOUNTER — Encounter: Payer: Self-pay | Admitting: Internal Medicine

## 2017-06-15 ENCOUNTER — Non-Acute Institutional Stay (SKILLED_NURSING_FACILITY): Payer: Medicare Other | Admitting: Internal Medicine

## 2017-06-15 DIAGNOSIS — J410 Simple chronic bronchitis: Secondary | ICD-10-CM | POA: Diagnosis not present

## 2017-06-15 DIAGNOSIS — G20 Parkinson's disease: Secondary | ICD-10-CM | POA: Diagnosis not present

## 2017-06-15 DIAGNOSIS — I10 Essential (primary) hypertension: Secondary | ICD-10-CM

## 2017-06-15 DIAGNOSIS — F02818 Dementia in other diseases classified elsewhere, unspecified severity, with other behavioral disturbance: Secondary | ICD-10-CM

## 2017-06-15 DIAGNOSIS — F0281 Dementia in other diseases classified elsewhere with behavioral disturbance: Secondary | ICD-10-CM

## 2017-06-15 DIAGNOSIS — S32592S Other specified fracture of left pubis, sequela: Secondary | ICD-10-CM

## 2017-06-15 DIAGNOSIS — S32591S Other specified fracture of right pubis, sequela: Secondary | ICD-10-CM

## 2017-06-15 NOTE — Progress Notes (Signed)
Provider: Einar CrowAnjali, Lucrezia Dehne  Location:   [facility] Room Number: 130/P Place of Service:  SNF (31)  PCP: [name], [name], NP Patient Care Team: [name], [name], NP as PCP - General (Nurse Practitioner)  Extended Emergency Contact Information Primary Emergency Contact: Toppin,[name] Address: [address]          [place], [place] [address] [place] Home Phone: [phone] Relation: Spouse Secondary Emergency Contact: Stifter,[name] Address: [address]          [place], [place] [address] [place] Home Phone: [phone] Mobile Phone: [phone] Relation: Son  Code Status: DNR Goals of Care: Advanced Directive information Advanced Directives 06/15/2017  Does Patient Have a Medical Advance Directive? Yes  Type of Advance Directive Out of facility DNR (pink MOST or yellow form)  Does patient want to make changes to medical advance directive? No - Patient declined  Copy of Healthcare Power of Attorney in Chart? No - copy requested  Would patient like information on creating a medical advance directive? -  Pre-existing out of facility DNR order (yellow form or pink MOST form) -      Chief Complaint  Patient presents with  . New Admit To SNF    New Admission Visit    HPI: Patient is a 84 y.o. female seen today for [facility]  Patient has a history of hypertension, Parkinson disease with hallucinations, recurrent falls, COPD, Recent Humerus fracture 09/18, Low back pain due to compression Fracture, Osteoporosis, Primary open angle Glaucoma  Patient was admitted in hospital from 01/10-01/11 after she fell at home and was found to have Bilateral Superior and Inferior Pubic Rami fracture. It was managed Conservatively. Initially she was admitted with Plan for discharge home but since patient has not made any progress and continues to be full assists she is staying on Long term,. Since her admission in  facility she was treated for Cellulitis with Doxycyline.  She is stable in Facility. Therapy has not been working with patient due to her worsening Cognition. According to Nurses she sometimes also Spit her Medicines and gets agitated with staff. Especially days her husband does not come to visit her. She was very pleasant on my exam today. Only c/o Mid epigastric Pain. Denied any Nausea or Vomiting. Has been eating well.She has gained 2 lbs since being here and is now 91 lbs.     Past Medical History:  Diagnosis Date  . Arthritis    osteoporosis  . Back pain   . COPD (chronic obstructive pulmonary disease) (HCC)   . Gait instability   . Glaucoma   . Glaucoma   . Hypertension   . Neck pain   . Parkinson's disease (HCC)   . Tremor    Past Surgical History:  Procedure Laterality Date  . CATARACT EXTRACTION    . EYE SURGERY    . TONSILLECTOMY    . TUBAL LIGATION      reports that  has never smoked. she has never used smokeless tobacco. She reports that she does not drink alcohol or use drugs. Social History   Socioeconomic History  . Marital status: Married    Spouse name: Normand Sloopheodore  . Number of children: 5  . Years of education: 3  . Highest education level: Not on file  Social Needs  . Financial resource strain: Not on file  . Food insecurity - worry: Not on file  . Food insecurity - inability: Not  on file  . Transportation needs - medical: Not on file  . Transportation needs - non-medical: Not on file  Occupational History  . Not on file  Tobacco Use  . Smoking status: Never Smoker  . Smokeless tobacco: Never Used  Substance and Sexual Activity  . Alcohol use: No  . Drug use: No  . Sexual activity: Not on file  Other Topics Concern  . Not on file  Social History Narrative   Lives at home with husband, family   Caffeine use- tea 2 cups daily, decaf green tea     Functional Status Survey:    Family History  Problem Relation Age of Onset  . Heart failure  Mother     Health Maintenance  Topic Date Due  . INFLUENZA VACCINE  07/16/2017 (Originally 11/11/2016)  . DEXA SCAN  07/16/2017 (Originally 01/02/1996)  . TETANUS/TDAP  07/16/2017 (Originally 01/01/1948)  . PNA vac Low Risk Adult (1 of 2 - PCV13) 07/16/2017 (Originally 01/02/1996)    Allergies  Allergen Reactions  . Hydrochlorothiazide Other (See Comments)    Reaction:  Dizziness   . Lisinopril Cough  . Other Other (See Comments)    Pt states that she is allergic to multiple BP meds -- does not recall the names.   Reaction:  Dizziness   . Spironolactone Other (See Comments)    Reaction:  Blurred vision and sweating of feet     Outpatient Encounter Medications as of 06/15/2017  Medication Sig  . acetaminophen (TYLENOL) 325 MG tablet Take 650 mg by mouth every 6 (six) hours as needed.  Marland Kitchen albuterol (PROVENTIL HFA;VENTOLIN HFA) 108 (90 Base) MCG/ACT inhaler Inhale 1-2 puffs into the lungs every 6 (six) hours as needed for wheezing or shortness of breath.  Marland Kitchen amLODipine (NORVASC) 5 MG tablet Take 5 mg by mouth daily.   Marland Kitchen atenolol (TENORMIN) 50 MG tablet Take 25 mg by mouth daily.   . budesonide-formoterol (SYMBICORT) 160-4.5 MCG/ACT inhaler Inhale 2 puffs into the lungs 2 (two) times daily.  Marland Kitchen lidocaine (LIDODERM) 5 % Place 2 patches onto the skin daily. Remove & Discard patch within 12 hours or as directed by MD  . loperamide (IMODIUM A-D) 2 MG tablet Take 2 mg by mouth 4 (four) times daily as needed for diarrhea or loose stools.  . nitroGLYCERIN (NITROSTAT) 0.4 MG SL tablet Place 1 tablet under the tongue as directed. Place 1 tablet under the tongue every five minutes as needed for emergency chest pain  . omeprazole (PRILOSEC) 40 MG capsule Take 40 mg by mouth daily as needed (for acid reflux).   Marland Kitchen oseltamivir (TAMIFLU) 30 MG capsule Take 30 mg by mouth daily.  . Probiotic Product (RISA-BID PROBIOTIC) TABS Take 1 tablet by mouth twice a day  . timolol (BETIMOL) 0.5 % ophthalmic solution  Place 1 drop into both eyes 2 (two) times daily.  . [DISCONTINUED] amoxicillin-clavulanate (AUGMENTIN) 500-125 MG tablet Take 1 tablet (500 mg total) by mouth 2 (two) times daily.  . [DISCONTINUED] aspirin EC 325 MG tablet Take 325 mg by mouth daily.  . [DISCONTINUED] Multiple Vitamin (MULTI-VITAMINS) TABS Take 1 tablet by mouth daily.  . [DISCONTINUED] omega-3 acid ethyl esters (LOVAZA) 1 g capsule Take 1 g by mouth daily.  . [DISCONTINUED] vitamin C (ASCORBIC ACID) 500 MG tablet Take 500 mg by mouth daily.  . [DISCONTINUED] vitamin E 400 UNIT capsule Take 400 Units by mouth daily.   No facility-administered encounter medications on file as of 06/15/2017.  Review of Systems  Not reliable due to her Dementia   Vitals:   06/15/17 1305  BP: (!) 108/56  Pulse: 60  Resp: 18  Temp: 97.8 F (36.6 C)   There is no height or weight on file to calculate BMI. Physical Exam  Constitutional: She appears well-developed and well-nourished.  HENT:  Head: Normocephalic.  Mouth/Throat: Oropharynx is clear and moist.  Eyes: Pupils are equal, round, and reactive to light.  Neck: Neck supple.  Cardiovascular: Normal rate and normal heart sounds.  No murmur heard. Pulmonary/Chest: Effort normal and breath sounds normal. No respiratory distress. She has no wheezes. She has no rales.  Abdominal: Soft. Bowel sounds are normal. She exhibits no distension. There is no tenderness. There is no rebound.  Musculoskeletal: She exhibits no edema.  Lymphadenopathy:    She has no cervical adenopathy.  Neurological: She is alert.  She knows she is in [place]. Was not oriented to Place or time. Follows Commands. Has tremors in both hands.  Psychiatric: Her speech is normal. Her mood appears anxious. She is hyperactive.    Labs reviewed: Basic Metabolic Panel: Recent Labs    04/29/17 1145 05/11/17 0709 05/21/17 0733  NA 136 137 132*  K 3.7 3.4* 4.4  CL 100* 105 98*  CO2 25 22 25   GLUCOSE  138* 106* 126*  BUN 28* 16 14  CREATININE 0.64 0.62 0.64  CALCIUM 8.9 8.6* 8.6*   Liver Function Tests: No results for input(s): AST, ALT, ALKPHOS, BILITOT, PROT, ALBUMIN in the last 8760 hours. No results for input(s): LIPASE, AMYLASE in the last 8760 hours. No results for input(s): AMMONIA in the last 8760 hours. CBC: Recent Labs    04/30/17 0400 05/01/17 0900 05/11/17 0709 05/21/17 0733  WBC 15.1* 14.7* 11.3* 7.4  NEUTROABS 10.0* 10.4* 8.2*  --   HGB 9.9* 10.3* 11.0* 11.6*  HCT 30.5* 32.1* 35.1* 37.2  MCV 95.3 95.8 96.2 95.4  PLT 346 442* 770* 354   Cardiac Enzymes: No results for input(s): CKTOTAL, CKMB, CKMBINDEX, TROPONINI in the last 8760 hours. BNP: Invalid input(s): POCBNP No results found for: HGBA1C Lab Results  Component Value Date   TSH 1.453 08/21/2015   No results found for: VITAMINB12 No results found for: FOLATE No results found for: IRON, TIBC, FERRITIN  Imaging and Procedures obtained prior to SNF admission: No results found.  Assessment/Plan H/o Closed bilateral fracture of pubic rami Patient does not c/o any pain. She is Full assists now. H/O Right upper lobe infiltrate with leukocytosis Was treated with Augmentin. Repeat x-ray Showed clearing  Parkinson's disease  Not on any treatment.  Seems like patient has not tolerated any aggressive treatment in the past.  Dementia with Behavior Issues Patient does have cognitive impairment CT scan of head in the hospital did not show any acute disease Will start her on Aricept to see if it helps with her Behavior issues Also try Seroquel 12.5 mg only PRN for Agitation.  COPD On ProAir air and Symbicort  Essential hypertension Discontinue Norvasc as BP low. Continue on Metoprolol GERD She was on PRN Prilosec but since she is c/o Pain will make it QD for 4 weeks and reevaluate.    Family/ staff Communication:   Labs/tests ordered: CMP, CBC, TSH, VIT D level  Total time spent in this  patient care encounter was 45_ minutes; greater than 50% of the visit spent counseling patient, reviewing records , Labs and coordinating care for problems addressed at this encounter.

## 2017-06-16 ENCOUNTER — Encounter (HOSPITAL_COMMUNITY)
Admission: RE | Admit: 2017-06-16 | Discharge: 2017-06-16 | Disposition: A | Discharge status: Home / Self Care | Payer: Medicare Other | Source: Skilled Nursing Facility | Attending: Internal Medicine | Admitting: Internal Medicine

## 2017-06-16 DIAGNOSIS — R918 Other nonspecific abnormal finding of lung field: Secondary | ICD-10-CM | POA: Diagnosis not present

## 2017-06-16 DIAGNOSIS — Z4789 Encounter for other orthopedic aftercare: Secondary | ICD-10-CM | POA: Insufficient documentation

## 2017-06-16 DIAGNOSIS — S3282XD Multiple fractures of pelvis without disruption of pelvic ring, subsequent encounter for fracture with routine healing: Secondary | ICD-10-CM | POA: Insufficient documentation

## 2017-06-16 DIAGNOSIS — X58XXXD Exposure to other specified factors, subsequent encounter: Secondary | ICD-10-CM | POA: Diagnosis not present

## 2017-06-16 LAB — COMPREHENSIVE METABOLIC PANEL
ALK PHOS: 104 U/L (ref 38–126)
ALT: 11 U/L — AB (ref 14–54)
AST: 21 U/L (ref 15–41)
Albumin: 3.3 g/dL — ABNORMAL LOW (ref 3.5–5.0)
Anion gap: 10 (ref 5–15)
BILIRUBIN TOTAL: 1.2 mg/dL (ref 0.3–1.2)
BUN: 15 mg/dL (ref 6–20)
CALCIUM: 9 mg/dL (ref 8.9–10.3)
CO2: 25 mmol/L (ref 22–32)
Chloride: 98 mmol/L — ABNORMAL LOW (ref 101–111)
Creatinine, Ser: 0.53 mg/dL (ref 0.44–1.00)
GFR calc Af Amer: >60 mL/min (ref >60)
Glucose, Bld: 107 mg/dL — ABNORMAL HIGH (ref 65–99)
Potassium: 4.2 mmol/L (ref 3.5–5.1)
Sodium: 133 mmol/L — ABNORMAL LOW (ref 135–145)
Total Protein: 8.3 g/dL — ABNORMAL HIGH (ref 6.5–8.1)

## 2017-06-16 LAB — CBC
HEMATOCRIT: 39.5 % (ref 36.0–46.0)
HEMOGLOBIN: 12.2 g/dL (ref 12.0–15.0)
MCH: 28.5 pg (ref 26.0–34.0)
MCHC: 30.9 g/dL (ref 30.0–36.0)
MCV: 92.3 fL (ref 78.0–100.0)
Platelets: 428 10*3/uL — ABNORMAL HIGH (ref 150–400)
RBC: 4.28 MIL/uL (ref 3.87–5.11)
RDW: 15.6 % — ABNORMAL HIGH (ref 11.5–15.5)
WBC: 8.2 10*3/uL (ref 4.0–10.5)

## 2017-06-16 LAB — TSH: TSH: 1.419 u[IU]/mL (ref 0.350–4.500)

## 2017-06-17 LAB — VITAMIN D 25 HYDROXY (VIT D DEFICIENCY, FRACTURES): Vit D, 25-Hydroxy: 41.6 ng/mL (ref 30.0–100.0)

## 2017-06-28 ENCOUNTER — Non-Acute Institutional Stay (SKILLED_NURSING_FACILITY): Payer: Medicare Other | Admitting: Internal Medicine

## 2017-06-28 ENCOUNTER — Encounter: Payer: Self-pay | Admitting: Internal Medicine

## 2017-06-28 DIAGNOSIS — R52 Pain, unspecified: Secondary | ICD-10-CM | POA: Diagnosis not present

## 2017-06-28 DIAGNOSIS — J410 Simple chronic bronchitis: Secondary | ICD-10-CM

## 2017-06-28 DIAGNOSIS — I10 Essential (primary) hypertension: Secondary | ICD-10-CM | POA: Diagnosis not present

## 2017-06-28 NOTE — Progress Notes (Signed)
Location:   [facility] Room Number: 130/P Place of Service:  SNF (31) Provider:  Jeanine Luz, NP  Patient Care Team: Estevan Oaks, NP as PCP - General (Nurse Practitioner)  Extended Emergency Contact Information Primary Emergency Contact: Diloreto,[name] Address: [address]          [place], [place] [address] [place] Home Phone: [phone] Relation: Spouse Secondary Emergency Contact: Abee,[name] Address: [address]          [place], [place] [address] [place] Home Phone: [phone] Mobile Phone: [phone] Relation: Son  Code Status:  DNR Goals of care: Advanced Directive information Advanced Directives 06/28/2017  Does Patient Have a Medical Advance Directive? Yes  Type of Advance Directive Out of facility DNR (pink MOST or yellow form)  Does patient want to make changes to medical advance directive? No - Patient declined  Copy of Healthcare Power of Attorney in Chart? No - copy requested  Would patient like information on creating a medical advance directive? -  Pre-existing out of facility DNR order (yellow form or pink MOST form) -    Chief complaint Acute visit secondary to increased pain complaints  HPI:  Pt is a 84 y.o. female seen today for an acute visit for follow-up of pain complaints.  Patient is relatively new to our service was recently transitioned to our service from [facility]-- She is now a long-term care.  She has a history of hypertension his Parkinson's disease with hallucinations recurrent falls COPD recent humerus fracture as well as low back pain because of compression fractures as well as osteoporosis also primary open-angle glaucoma.  She was admitted to the hospital back in January after she fell at home and had the bilateral superior inferior pubic rami fracture managed conservatively.  According to nursing staff she appears to be complaining more at times of  some pain this is difficult to localize secondary to dementia but appears to be more in the leg possibly pelvic area with her history of compression fractures and pelvic fracture.  At one point she had been on the Lidoderm patch but this was discontinued secondary to thinking she had pain stability but apparently  is now having some increased pain complaints--nursing does not report any recent trauma to my knowledge.  When I asked her she says she is not currently hurting but when I reassessed her she did complain of some pain in her legs again this is somewhat nonspecific.   r   Past Medical History:  Diagnosis Date  . Arthritis    osteoporosis  . Back pain   . COPD (chronic obstructive pulmonary disease) (HCC)   . Gait instability   . Glaucoma   . Glaucoma   . Hypertension   . Neck pain   . Parkinson's disease (HCC)   . Tremor    Past Surgical History:  Procedure Laterality Date  . CATARACT EXTRACTION    . EYE SURGERY    . TONSILLECTOMY    . TUBAL LIGATION      Allergies  Allergen Reactions  . Hydrochlorothiazide Other (See Comments)    Reaction:  Dizziness   . Lisinopril Cough  . Other Other (See Comments)    Pt states that she is allergic to multiple BP meds -- does not recall the names.   Reaction:  Dizziness   . Spironolactone Other (See Comments)    Reaction:  Blurred vision and sweating of feet  Outpatient Encounter Medications as of 06/28/2017  Medication Sig  . acetaminophen (TYLENOL) 325 MG tablet Take 650 mg by mouth every 6 (six) hours as needed.  Marland Kitchen. albuterol (PROVENTIL HFA;VENTOLIN HFA) 108 (90 Base) MCG/ACT inhaler Inhale 1-2 puffs into the lungs every 6 (six) hours as needed for wheezing or shortness of breath.  Marland Kitchen. amLODipine (NORVASC) 5 MG tablet Take 5 mg by mouth daily.   Marland Kitchen. atenolol (TENORMIN) 50 MG tablet Take 25 mg by mouth daily.   . budesonide-formoterol (SYMBICORT) 160-4.5 MCG/ACT inhaler Inhale 2 puffs into the lungs 2 (two) times daily.  Marland Kitchen.  loperamide (IMODIUM A-D) 2 MG tablet Take 2 mg by mouth 4 (four) times daily as needed for diarrhea or loose stools.  . nitroGLYCERIN (NITROSTAT) 0.4 MG SL tablet Place 1 tablet under the tongue as directed. Place 1 tablet under the tongue every five minutes as needed for emergency chest pain  . omeprazole (PRILOSEC) 40 MG capsule Take 40 mg by mouth daily as needed (for acid reflux).   . Probiotic Product (RISA-BID PROBIOTIC) TABS Take 1 tablet by mouth twice a day  . timolol (BETIMOL) 0.5 % ophthalmic solution Place 1 drop into both eyes 2 (two) times daily.  . [DISCONTINUED] lidocaine (LIDODERM) 5 % Place 2 patches onto the skin daily. Remove & Discard patch within 12 hours or as directed by MD  . [DISCONTINUED] oseltamivir (TAMIFLU) 30 MG capsule Take 30 mg by mouth daily.   No facility-administered encounter medications on file as of 06/28/2017.     Review of Systems Limited secondary to dementia please see HPI  There is no immunization history on file for this patient. Pertinent  Health Maintenance Due  Topic Date Due  . INFLUENZA VACCINE  07/16/2017 (Originally 11/11/2016)  . DEXA SCAN  07/16/2017 (Originally 01/02/1996)  . PNA vac Low Risk Adult (1 of 2 - PCV13) 07/16/2017 (Originally 01/02/1996)   Fall Risk  07/18/2015 03/19/2015 02/12/2015  Falls in the past year? Yes No Yes  Number falls in past yr: 1 - 2 or more  Injury with Fall? No - Yes  Comment - - left lower leg   Risk for fall due to : (No Data) - -  Risk for fall due to: Comment accidently hit by door that pushed her down - -   Functional Status Survey:    Vitals:   06/28/17 1257  BP: (!) 98/48  Pulse: 68  Resp: 18  Temp: 98 F (36.7 C)  TempSrc: Oral  SpO2: 96%  Of note manual blood pressure was 150/60  Physical Exam  In general this is a somewhat frail elderly female in no distress sitting comfortably in a wheelchair.  Her skin is warm and dry.  Eyes she has prescription lenses visual acuity appears to be  grossly intact.  Oropharynx is clear mucous membranes are moist.  Chest is clear to auscultation with somewhat shallow air entry there is no labored breathing.  Heart is regular rate and rhythm without murmur gallop or rub.--.  Abdomen is soft nontender with positive bowel sounds.  Musculoskeletal has general frailty strength appears to be baseline- does not really have significant tenderness to palpation of her back of her legs but apparently at times will complain of some musculoskeletal discomfort in these areas.  Neurologic she does not speak much but she is alert and her speech is relatively clear.  Psych she does follow simple verbal commands and will give short responses to fairly straightforward questions  abs reviewed: Recent Labs    05/11/17 0709 05/21/17 0733 06/16/17 0712  NA 137 132* 133*  K 3.4* 4.4 4.2  CL 105 98* 98*  CO2 22 25 25   GLUCOSE 106* 126* 107*  BUN 16 14 15   CREATININE 0.62 0.64 0.53  CALCIUM 8.6* 8.6* 9.0   Recent Labs    06/16/17 0712  AST 21  ALT 11*  ALKPHOS 104  BILITOT 1.2  PROT 8.3*  ALBUMIN 3.3*   Recent Labs    04/30/17 0400 05/01/17 0900 05/11/17 0709 05/21/17 0733 06/16/17 0712  WBC 15.1* 14.7* 11.3* 7.4 8.2  NEUTROABS 10.0* 10.4* 8.2*  --   --   HGB 9.9* 10.3* 11.0* 11.6* 12.2  HCT 30.5* 32.1* 35.1* 37.2 39.5  MCV 95.3 95.8 96.2 95.4 92.3  PLT 346 442* 770* 354 428*   Lab Results  Component Value Date   TSH 1.419 06/16/2017   No results found for: HGBA1C No results found for: CHOL, HDL, LDLCALC, LDLDIRECT, TRIG, CHOLHDL  Significant Diagnostic Results in last 30 days:  No results found.  Assessment/Plan  #1 generalized pain-will restart Lidoderm patch at one point she apparently had 2 patches will restart at one patch but will have a low threshold for increasing this certainly-she is a poor historian at times will deny pain but on reassessment did complain that she does have pain at times.  At this  point will start the Lidoderm patch and monitor.  2.  Hypertension?  Listed blood pressure was systolically in the high 90s-manual reading I did get 150 appears to have some variability here she is on low-dose Norvasc as well as atenolol will write an order to hold the Norvasc for systolic blood pressure less than 120.  #3 COPD she is on Proventil as needed and Symbicort routinely twice daily this appears to be stable she does have shallow air entry but I could not really appreciate any overt congestion or cough  [id number]-note greater than 25 minutes spent assessing patient reviewing her chart and labs- discussing her status with nursing staff as well as with Dr. Chales Abrahams who participated in the plan of care

## 2017-06-29 ENCOUNTER — Non-Acute Institutional Stay (SKILLED_NURSING_FACILITY): Payer: Medicare Other | Admitting: Internal Medicine

## 2017-06-29 ENCOUNTER — Other Ambulatory Visit: Payer: Self-pay

## 2017-06-29 ENCOUNTER — Encounter: Payer: Self-pay | Admitting: Internal Medicine

## 2017-06-29 ENCOUNTER — Encounter (HOSPITAL_COMMUNITY): Payer: Self-pay | Admitting: Emergency Medicine

## 2017-06-29 ENCOUNTER — Emergency Department (HOSPITAL_COMMUNITY)
Admission: EM | Admit: 2017-06-29 | Discharge: 2017-06-29 | Disposition: A | Discharge status: Skilled Nursing Facility | Payer: Medicare Other | Attending: Emergency Medicine | Admitting: Emergency Medicine

## 2017-06-29 ENCOUNTER — Emergency Department (HOSPITAL_COMMUNITY): Payer: Medicare Other

## 2017-06-29 ENCOUNTER — Encounter (HOSPITAL_COMMUNITY)
Admission: RE | Admit: 2017-06-29 | Discharge: 2017-06-29 | Disposition: A | Discharge status: Home / Self Care | Payer: Medicare Other | Source: Skilled Nursing Facility | Attending: Internal Medicine | Admitting: Internal Medicine

## 2017-06-29 ENCOUNTER — Inpatient Hospital Stay
Admission: RE | Admit: 2017-06-29 | Discharge: 2017-08-20 | Disposition: A | Discharge status: Nursing Home (Includes ICF) | Payer: Medicare Other | Source: Ambulatory Visit | Attending: Internal Medicine | Admitting: Internal Medicine

## 2017-06-29 DIAGNOSIS — S3282XD Multiple fractures of pelvis without disruption of pelvic ring, subsequent encounter for fracture with routine healing: Secondary | ICD-10-CM | POA: Diagnosis not present

## 2017-06-29 DIAGNOSIS — R079 Chest pain, unspecified: Secondary | ICD-10-CM | POA: Diagnosis not present

## 2017-06-29 DIAGNOSIS — R918 Other nonspecific abnormal finding of lung field: Secondary | ICD-10-CM | POA: Diagnosis not present

## 2017-06-29 DIAGNOSIS — Z4789 Encounter for other orthopedic aftercare: Secondary | ICD-10-CM | POA: Diagnosis not present

## 2017-06-29 DIAGNOSIS — I10 Essential (primary) hypertension: Secondary | ICD-10-CM | POA: Diagnosis not present

## 2017-06-29 DIAGNOSIS — Z79899 Other long term (current) drug therapy: Secondary | ICD-10-CM | POA: Insufficient documentation

## 2017-06-29 DIAGNOSIS — R0789 Other chest pain: Secondary | ICD-10-CM | POA: Diagnosis not present

## 2017-06-29 DIAGNOSIS — J449 Chronic obstructive pulmonary disease, unspecified: Secondary | ICD-10-CM | POA: Diagnosis not present

## 2017-06-29 DIAGNOSIS — G20 Parkinson's disease: Secondary | ICD-10-CM | POA: Diagnosis not present

## 2017-06-29 LAB — CBC WITH DIFFERENTIAL/PLATELET
BASOS PCT: 1 %
Basophils Absolute: 0.1 10*3/uL (ref 0.0–0.1)
Eosinophils Absolute: 0.1 10*3/uL (ref 0.0–0.7)
Eosinophils Relative: 1 %
HEMATOCRIT: 39.3 % (ref 36.0–46.0)
HEMOGLOBIN: 12.3 g/dL (ref 12.0–15.0)
LYMPHS ABS: 1 10*3/uL (ref 0.7–4.0)
LYMPHS PCT: 10 %
MCH: 28.7 pg (ref 26.0–34.0)
MCHC: 31.3 g/dL (ref 30.0–36.0)
MCV: 91.8 fL (ref 78.0–100.0)
MONOS PCT: 18 %
Monocytes Absolute: 1.8 10*3/uL — ABNORMAL HIGH (ref 0.1–1.0)
NEUTROS ABS: 7.1 10*3/uL (ref 1.7–7.7)
NEUTROS PCT: 70 %
Platelets: 360 10*3/uL (ref 150–400)
RBC: 4.28 MIL/uL (ref 3.87–5.11)
RDW: 15.6 % — AB (ref 11.5–15.5)
WBC: 10.1 10*3/uL (ref 4.0–10.5)

## 2017-06-29 LAB — COMPREHENSIVE METABOLIC PANEL
ALBUMIN: 3.1 g/dL — AB (ref 3.5–5.0)
ALK PHOS: 86 U/L (ref 38–126)
ALT: 10 U/L — ABNORMAL LOW (ref 14–54)
AST: 20 U/L (ref 15–41)
Anion gap: 10 (ref 5–15)
BILIRUBIN TOTAL: 0.8 mg/dL (ref 0.3–1.2)
BUN: 19 mg/dL (ref 6–20)
CALCIUM: 8.7 mg/dL — AB (ref 8.9–10.3)
CO2: 26 mmol/L (ref 22–32)
Chloride: 98 mmol/L — ABNORMAL LOW (ref 101–111)
Creatinine, Ser: 0.63 mg/dL (ref 0.44–1.00)
GFR calc Af Amer: >60 mL/min (ref >60)
GLUCOSE: 124 mg/dL — AB (ref 65–99)
POTASSIUM: 3.7 mmol/L (ref 3.5–5.1)
Sodium: 134 mmol/L — ABNORMAL LOW (ref 135–145)
TOTAL PROTEIN: 7.9 g/dL (ref 6.5–8.1)

## 2017-06-29 LAB — BASIC METABOLIC PANEL
Anion gap: 13 (ref 5–15)
BUN: 18 mg/dL (ref 6–20)
CO2: 24 mmol/L (ref 22–32)
CREATININE: 0.58 mg/dL (ref 0.44–1.00)
Calcium: 8.8 mg/dL — ABNORMAL LOW (ref 8.9–10.3)
Chloride: 97 mmol/L — ABNORMAL LOW (ref 101–111)
GFR calc Af Amer: >60 mL/min (ref >60)
GFR calc non Af Amer: >60 mL/min (ref >60)
GLUCOSE: 144 mg/dL — AB (ref 65–99)
POTASSIUM: 3.8 mmol/L (ref 3.5–5.1)
SODIUM: 134 mmol/L — AB (ref 135–145)

## 2017-06-29 LAB — TROPONIN I
TROPONIN I: 0.03 ng/mL — AB (ref <0.03)
Troponin I: <0.03 ng/mL (ref <0.03)

## 2017-06-29 LAB — LIPASE, BLOOD: Lipase: 47 U/L (ref 11–51)

## 2017-06-29 LAB — MAGNESIUM: MAGNESIUM: 1.9 mg/dL (ref 1.7–2.4)

## 2017-06-29 MED ORDER — ASPIRIN 81 MG PO CHEW
324.0000 mg | CHEWABLE_TABLET | Freq: Once | ORAL | Status: AC
  Administered 2017-06-29: 324 mg via ORAL
  Filled 2017-06-29: qty 4

## 2017-06-29 NOTE — ED Notes (Signed)
Pt's family informed Tresa EndoKelly with lab that they did not want her lab drawn; Dr. Hyacinth MeekerMiller informed

## 2017-06-29 NOTE — ED Provider Notes (Signed)
[facility] EMERGENCY DEPARTMENT Provider Note   CSN: [id number] Arrival date & time: 06/29/17  0957     History   Chief Complaint Chief Complaint  Patient presents with  . Chest Pain    HPI Lenell Claw is a 84 y.o. female.  Patient brought in by EMS from [facility] nursing center.  The report was the patient had chest pain starting at 9 this morning.  Family members mentioned that she may have been complaining chest pain on and off for the past several days.  She was given 81 mg of aspirin prior to arrival.  Upon arrival nurses thought she was complaining of stomach pain.  Her son thought that she was complaining of neck pain when I saw patient patient denied any pain at all.  Patient is a DNR patient has a history of Parkinson's disease.  There seems to be a component of dementia.      Past Medical History:  Diagnosis Date  . Arthritis    osteoporosis  . Back pain   . COPD (chronic obstructive pulmonary disease) (HCC)   . Gait instability   . Glaucoma   . Glaucoma   . Hypertension   . Neck pain   . Parkinson's disease (HCC)   . Tremor     Patient Active Problem List   Diagnosis Date Noted  . Recurrent falls 04/26/2017  . Dementia 04/26/2017  . Pelvic fracture (HCC) 04/22/2017  . COPD (chronic obstructive pulmonary disease) (HCC) 04/22/2017  . HTN (hypertension) 04/22/2017  . Parkinson's disease (HCC) 04/22/2017  . Glaucoma 04/22/2017  . Osteopenia 04/22/2017  . Bilateral fracture of pubic rami (HCC) 04/22/2017  . Traumatic closed nondisp fracture of greater tuberosity of humerus, left, initial encounter 12/08/2016  . Dizziness 10/13/2016    Past Surgical History:  Procedure Laterality Date  . CATARACT EXTRACTION    . EYE SURGERY    . TONSILLECTOMY    . TUBAL LIGATION      OB History    Gravida Para Term Preterm AB Living   5 5 5          SAB TAB Ectopic Multiple Live Births                   Home Medications    Prior to Admission medications     Medication Sig Start Date End Date Taking? Authorizing Provider  acetaminophen (TYLENOL) 325 MG tablet Take 650 mg by mouth every 6 (six) hours as needed.    [provider]  albuterol (PROVENTIL HFA;VENTOLIN HFA) 108 (90 Base) MCG/ACT inhaler Inhale 1-2 puffs into the lungs every 6 (six) hours as needed for wheezing or shortness of breath.    [provider]  amLODipine (NORVASC) 5 MG tablet Take 2.5 mg by mouth daily.     [provider]  atenolol (TENORMIN) 50 MG tablet Take 25 mg by mouth daily.     [provider]  budesonide-formoterol (SYMBICORT) 160-4.5 MCG/ACT inhaler Inhale 2 puffs into the lungs 2 (two) times daily.    [provider]  lidocaine (LIDODERM) 5 % Place 1 patch onto the skin every 12 (twelve) hours. Remove & Discard patch within 12 hours or as directed by MD    [provider]  loperamide (IMODIUM A-D) 2 MG tablet Take 2 mg by mouth 4 (four) times daily as needed for diarrhea or loose stools.    [provider]  nitroGLYCERIN (NITROSTAT) 0.4 MG SL tablet Place 1 tablet under the tongue  as directed. Place 1 tablet under the tongue every five minutes as needed for emergency chest pain 11/16/12   [provider]  omeprazole (PRILOSEC) 40 MG capsule Take 40 mg by mouth daily as needed (for acid reflux).     [provider]  Probiotic Product (RISA-BID PROBIOTIC) TABS Take 1 tablet by mouth twice a day    [provider]  timolol (BETIMOL) 0.5 % ophthalmic solution Place 1 drop into both eyes 2 (two) times daily.    [provider]    Family History Family History  Problem Relation Age of Onset  . Heart failure Mother     Social History Social History   Tobacco Use  . Smoking status: Never Smoker  . Smokeless tobacco: Never Used  Substance Use Topics  . Alcohol use: No  . Drug use: No     Allergies   Hydrochlorothiazide; Lisinopril; Other; and  Spironolactone   Review of Systems Review of Systems  Unable to perform ROS: Dementia     Physical Exam Updated Vital Signs BP (!) 106/58 (BP Location: Left Arm)   Pulse (!) 59   Resp 16   Ht 1.626 m (5\' 4" )   Wt 42.6 kg (94 lb)   SpO2 99%   BMI 16.14 kg/m   Physical Exam  Constitutional: She is oriented to person, place, and time. She appears well-developed and well-nourished. No distress.  HENT:  Head: Normocephalic and atraumatic.  Mouth/Throat: Oropharynx is clear and moist.  Eyes: Conjunctivae and EOM are normal. Pupils are equal, round, and reactive to light.  Neck: Neck supple.  Cardiovascular: Normal rate, regular rhythm and normal heart sounds.  Pulmonary/Chest: Effort normal and breath sounds normal. No respiratory distress.  Abdominal: Soft. Bowel sounds are normal. There is no tenderness.  Musculoskeletal: Normal range of motion. She exhibits no edema.  Neurological: She is alert and oriented to person, place, and time. No cranial nerve deficit or sensory deficit. She exhibits normal muscle tone. Coordination normal.  Skin: Skin is warm.  Nursing note and vitals reviewed.    ED Treatments / Results  Labs (all labs ordered are listed, but only abnormal results are displayed) Labs Reviewed - No data to display  EKG  EKG Interpretation None      ED ECG REPORT   Date: 06/29/2017  Rate: 58  Rhythm: normal sinus rhythm  QRS Axis: normal  Intervals: normal  ST/T Wave abnormalities: normal  Conduction Disutrbances:none  Narrative Interpretation:   Old EKG Reviewed: none available  I have personally reviewed the EKG tracing and agree with the computerized printout as noted.   Radiology No results found.  Procedures Procedures (including critical care time)  Medications Ordered in ED Medications - No data to display   Initial Impression / Assessment and Plan / ED Course  I have reviewed the triage vital signs and the nursing  notes.  Pertinent labs & imaging results that were available during my care of the patient were reviewed by me and considered in my medical decision making (see chart for details).    Patient without any complaints and on exam no obvious abnormalities other than some confusion.  Which according to family is somewhat baseline.  Patient's initial troponin was negative repeat 3 hours later since we did not have good history on when the pain started came in at borderline at 0.03.  Patient will have a repeat troponin at 1700.  If that is increasing then patient will require admission either by  cardiology or admission here for further rule out.   My plan would be that if the troponin increases significantly talk to cardiology if it is just a slight increase from the borderline Juan then perhaps she can be admitted here for subsequent troponins.  Otherwise patient can go home.   Final Clinical Impressions(s) / ED Diagnoses   Final diagnoses:  None    ED Discharge Orders    None       Vanetta Mulders, MD 06/29/17 1601

## 2017-06-29 NOTE — ED Provider Notes (Signed)
Care was discussed with Dr. Rennis GoldenHilty of the cardiology service, the patient has a slightly elevated troponin at 0.03 at which was originally measured at < 0.03.  At this time the patient does not appear to be in distress, I will discussed this with the family and come up with a plan going forward. 3rd trop neg, pt updated / family updated, they have declined further w/u and want her to go back to NH,  I agree with disposition, they have been informed of the reasons for return and they are all in agreement.   Eber HongMiller, Jeanice Dempsey, MD 06/29/17 [phone]

## 2017-06-29 NOTE — ED Notes (Signed)
CRITICAL VALUE ALERT  Critical Value:  Trop 0.03  Date & Time Notied:  06/29/17 1408  Provider Notified: zackowski  Orders Received/Actions taken: na

## 2017-06-29 NOTE — Progress Notes (Signed)
Location:   [facility] Room Number: 130/P Place of Service:  SNF (31) Provider:  Jeanine LuzArlo Jadyn Brasher  Brown, [name], NP  Patient Care Team: [name], [name], NP as PCP - General (Nurse Practitioner)  Extended Emergency Contact Information Primary Emergency Contact: Grimley,[name] Address: [address]          [place], [place] [address] [place] Home Phone: [phone] Relation: Spouse Secondary Emergency Contact: Siple,[name] Address: [address]          [place], [place] [address] [place] Home Phone: [phone] Mobile Phone: [phone] Relation: Son  Code Status:  DNR Goals of care: Advanced Directive information Advanced Directives 06/29/2017  Does Patient Have a Medical Advance Directive? Yes  Type of Advance Directive Out of facility DNR (pink MOST or yellow form)  Does patient want to make changes to medical advance directive? No - Patient declined  Copy of Healthcare Power of Attorney in Chart? No - copy requested  Would patient like information on creating a medical advance directive? -  Pre-existing out of facility DNR order (yellow form or pink MOST form) -     Chief Complaint  Patient presents with  . Acute Visit    Patients c/o Chest pain    HPI:  Pt is a 84 y.o. female seen today for an acute visit for complaints of chest pain--- apparently with some radiation to her left arm.     she is relatively new to our service recently transitioned to our service from [facility]  She has a history of hypertension as well as Parkinson's disease recurrent falls COPD recent humerus fracture as well as low back pain with compression fractures as well as osteoporosis and primary open angle glaucoma.  She was admitted to the hospital in January after she fell at home and had bilateral superior and inferior pubic rami fractures that were managed conservatively.  I saw her yesterday for  generalized pain complaints  she denied pain initially but when I later reevaluated her she did complain of some leg discomfort-she had been on Lidoderm patch actually 2 patches previously-that had been discontinued we did restart the Lidoderm patch 1 patch daily.  This morning apparently patient complained of midsternal chest pain that apparently radiated to her left arm.  Vital signs appear to be stable as well as oxygen saturations.  However the pain was somewhat persistent when we re-asked-- and we did give her a low-dose aspirin and also notified EMS for expedient evaluation.  Before EMS arrived we did monitor her and she remained stable still complaining at times however of some chest pain with radiation by the time EMS arrived she seemed to indicate that this was somewhat improved-.      Past Medical History:  Diagnosis Date  . Arthritis    osteoporosis  . Back pain   . COPD (chronic obstructive pulmonary disease) (HCC)   . Gait instability   . Glaucoma   . Glaucoma   . Hypertension   . Neck pain   . Parkinson's disease (HCC)   . Tremor    Past Surgical History:  Procedure Laterality Date  . CATARACT EXTRACTION    . EYE SURGERY    . TONSILLECTOMY    . TUBAL LIGATION      Allergies  Allergen Reactions  . Hydrochlorothiazide Other (See Comments)    Reaction:  Dizziness   . Lisinopril Cough  . Other Other (See Comments)    Pt states  that she is allergic to multiple BP meds -- does not recall the names.   Reaction:  Dizziness   . Spironolactone Other (See Comments)    Reaction:  Blurred vision and sweating of feet     Outpatient Encounter Medications as of 06/29/2017  Medication Sig  . acetaminophen (TYLENOL) 325 MG tablet Take 650 mg by mouth every 6 (six) hours as needed.  Marland Kitchen albuterol (PROVENTIL HFA;VENTOLIN HFA) 108 (90 Base) MCG/ACT inhaler Inhale 1-2 puffs into the lungs every 6 (six) hours as needed for wheezing or shortness of breath.  Marland Kitchen amLODipine (NORVASC) 5 MG tablet Take 2.5 mg by  mouth daily.   Marland Kitchen atenolol (TENORMIN) 50 MG tablet Take 25 mg by mouth daily.   . budesonide-formoterol (SYMBICORT) 160-4.5 MCG/ACT inhaler Inhale 2 puffs into the lungs 2 (two) times daily.  Marland Kitchen lidocaine (LIDODERM) 5 % Place 1 patch onto the skin every 12 (twelve) hours. Remove & Discard patch within 12 hours or as directed by MD  . loperamide (IMODIUM A-D) 2 MG tablet Take 2 mg by mouth 4 (four) times daily as needed for diarrhea or loose stools.  . nitroGLYCERIN (NITROSTAT) 0.4 MG SL tablet Place 1 tablet under the tongue as directed. Place 1 tablet under the tongue every five minutes as needed for emergency chest pain  . omeprazole (PRILOSEC) 40 MG capsule Take 40 mg by mouth daily as needed (for acid reflux).   . Probiotic Product (RISA-BID PROBIOTIC) TABS Take 1 tablet by mouth twice a day  . timolol (BETIMOL) 0.5 % ophthalmic solution Place 1 drop into both eyes 2 (two) times daily.   No facility-administered encounter medications on file as of 06/29/2017.     Review of Systems   In general she is not complaining of fever chills.  Respiratory does not complain of shortness of breath beyond baseline she does have a history of COPD.  Cardiac appears to be complaining of some midsternal discomfort radiating to her left arm-.  GI is not really complain of abdominal pain although pain complaints are somewhat nonspecific and may be upper abdominal.  Is not complaining of nausea--no vomiting has been noted  Musculoskeletal again does have somewhat diffuse joint complaints at times of pain appears to be more chest related today.  Neurologic does not really complain of dizziness headache or syncope  has continued weakness         There is no immunization history on file for this patient. Pertinent  Health Maintenance Due  Topic Date Due  . INFLUENZA VACCINE  07/16/2017 (Originally 11/11/2016)  . DEXA SCAN  07/16/2017 (Originally 01/02/1996)  . PNA vac Low Risk Adult (1 of 2 - PCV13)  07/16/2017 (Originally 01/02/1996)   Fall Risk  07/18/2015 03/19/2015 02/12/2015  Falls in the past year? Yes No Yes  Number falls in past yr: 1 - 2 or more  Injury with Fall? No - Yes  Comment - - left lower leg   Risk for fall due to : (No Data) - -  Risk for fall due to: Comment accidently hit by door that pushed her down - -   Functional Status Survey:    Vitals:   06/29/17 0952  BP: (!) 110/58  Pulse: 65  Resp: 18  Temp: 97.9 F (36.6 C)  TempSrc: Oral  SpO2: 97%    Physical Exam   In general this is a somewhat frail elderly female who does not appear to be in any acute distress but somewhat uncomfortable.  Her skin is warm and dry.  Eyes she has prescription lenses visual acuity appears grossly intact.  Chest is clear to auscultation with shallow air entry there is no labored breathing.  Heart is regular rate and rhythm without murmur gallop or rub.  Her abdomen is soft does not appear to be acutely tender there are positive bowel sounds-.  Musculoskeletal has general frailty --strength appears to be baseline with significant lower extremity weakness- she has diffuse frailty-- arthritic changes  Neurologic she does not speak much but her speech is clear she is alert neurologically appears to be at baseline.  Psych she follows simple verbal commands does speak some but gives short responses which is her baseline    Labs reviewed: Recent Labs    05/21/17 0733 06/16/17 0712 06/29/17 0742  NA 132* 133* 134*  K 4.4 4.2 3.8  CL 98* 98* 97*  CO2 25 25 24   GLUCOSE 126* 107* 144*  BUN 14 15 18   CREATININE 0.64 0.53 0.58  CALCIUM 8.6* 9.0 8.8*  MG  --   --  1.9   Recent Labs    06/16/17 0712  AST 21  ALT 11*  ALKPHOS 104  BILITOT 1.2  PROT 8.3*  ALBUMIN 3.3*   Recent Labs    04/30/17 0400 05/01/17 0900 05/11/17 0709 05/21/17 0733 06/16/17 0712  WBC 15.1* 14.7* 11.3* 7.4 8.2  NEUTROABS 10.0* 10.4* 8.2*  --   --   HGB 9.9* 10.3* 11.0* 11.6* 12.2    HCT 30.5* 32.1* 35.1* 37.2 39.5  MCV 95.3 95.8 96.2 95.4 92.3  PLT 346 442* 770* 354 428*   Lab Results  Component Value Date   TSH 1.419 06/16/2017   No results found for: HGBA1C No results found for: CHOL, HDL, LDLCALC, LDLDIRECT, TRIG, CHOLHDL  Significant Diagnostic Results in last 30 days:  No results found.  Assessment/Plan  #1 chest pain with radiation-at this point will send her to the ER to rule out any acute cardiac event-vital signs appear to be stable as well as O2 saturations she seemed to indicate the pain was easing by the time EMS arrived-she also did burp some which may have given her relief and this could be GI related.  Speaking with her nurse  this morning  apparently at some point over the weekend she had complained of some chest pain and did apparentlyeceive a nitro but apparently this brought her blood pressure down significantly.  We did give her a low-dose aspirin before EMS arrived she appears to be stable but again this will need to be checked out expediently.  CPT- [phone]

## 2017-06-29 NOTE — ED Notes (Signed)
[facility]

## 2017-06-29 NOTE — Discharge Instructions (Signed)
Please return should you develop any increased chest pain, or difficutly breathing or for any other reasons.

## 2017-06-29 NOTE — ED Triage Notes (Signed)
Pt from from [facility] for complaint of chest pain starting at 0900 this morning.  Pt was given Aspirin 81mg  pta.  Pt is currently c/o stomach pain.

## 2017-06-29 NOTE — ED Notes (Signed)
Pt returned from xray

## 2017-06-30 ENCOUNTER — Non-Acute Institutional Stay (SKILLED_NURSING_FACILITY): Payer: Medicare Other | Admitting: Internal Medicine

## 2017-06-30 ENCOUNTER — Encounter: Payer: Self-pay | Admitting: Internal Medicine

## 2017-06-30 DIAGNOSIS — R079 Chest pain, unspecified: Secondary | ICD-10-CM

## 2017-06-30 DIAGNOSIS — R52 Pain, unspecified: Secondary | ICD-10-CM

## 2017-06-30 DIAGNOSIS — F419 Anxiety disorder, unspecified: Secondary | ICD-10-CM | POA: Diagnosis not present

## 2017-06-30 DIAGNOSIS — I10 Essential (primary) hypertension: Secondary | ICD-10-CM | POA: Diagnosis not present

## 2017-06-30 NOTE — Progress Notes (Signed)
Location:   [facility] Room Number: 130/P Place of Service:  SNF (31) Provider:  Brandon Melnick, [name] Nieves, NP addendum  Patient Care Team: Estevan Oaks, NP as PCP - General (Nurse Practitioner)  Extended Emergency Contact Information Primary Emergency Contact: Craighead,[name] Address: [address]          [place], [place] [address] [place] Home Phone: [phone] Relation: Spouse Secondary Emergency Contact: Bruster,[name] Address: [address]          [place], [place] [address] [place] Home Phone: [phone] Mobile Phone: [phone] Relation: Son  Code Status:  DNR Goals of care: Advanced Directive information Advanced Directives 06/30/2017  Does Patient Have a Medical Advance Directive? Yes  Type of Advance Directive Out of facility DNR (pink MOST or yellow form)  Does patient want to make changes to medical advance directive? No - Patient declined  Copy of Healthcare Power of Attorney in Chart? No - copy requested  Would patient like information on creating a medical advance directive? -  Pre-existing out of facility DNR order (yellow form or pink MOST form) -     Chief Complaint  Patient presents with  . Acute Visit    F/U ER Visit for Chest Pains    HPI:  Pt is a 84 y.o. female seen today for an acute visit for follow-up of ER visit yesterday for apparent chest pain.  Patient has a history of hypertension as well as Parkinson's disease recurrent falls COPD recent humerus fracture as well as chronic low back pain with compression fractures-osteoporosis primary open angle,-she was actually hospitalized back in January after a fall at home and had bilateral pubic rami fractures have been managed conservatively.  She has had somewhat generalized pain complaints at one point she went on to Lidoderm patches this apparently was discontinued for a while but pain returned and she was just  restarted on 1  Lidoderm patch.  Apparently yesterday morning she complained of some midsternal chest pain with radiation to her left arm-she is a somewhat poor historian secondary to dementia so this was difficult to localize but pain complaints in these areas were in the ER included cardiac workup which showed an EKG with no acute changes apparently-initial troponin level was less than 0 quite persistent so she was sent to the ER   0.03-however repeat troponin apparently was equivocal at 0.03.  Per discussion with son at bedside today apparently subsequent troponin was somewhat confusing thought to be elevated but it appears the final troponin was normalized at less than 0.03.  Discussions were held with the family and they have opted to have her return to the nursing facility and monitored.  Today she appears to be doing well she is not really complaining of pain currently vital signs appear to be stable her systolic is slightly low in the low 100s and we will discontinue her Norvasc but she is not complaining of any chest pain.  Her son from [place] is visiting with her...     Past Medical History:  Diagnosis Date  . Arthritis    osteoporosis  . Back pain   . COPD (chronic obstructive pulmonary disease) (HCC)   . Gait instability   . Glaucoma   . Glaucoma   . Hypertension   . Neck pain   . Parkinson's disease (HCC)   . Tremor    Past Surgical History:  Procedure Laterality Date  . CATARACT EXTRACTION    .  EYE SURGERY    . TONSILLECTOMY    . TUBAL LIGATION      Allergies  Allergen Reactions  . Hydrochlorothiazide Other (See Comments)    Reaction:  Dizziness   . Lisinopril Cough  . Other Other (See Comments)    Pt states that she is allergic to multiple BP meds -- does not recall the names.   Reaction:  Dizziness   . Spironolactone Other (See Comments)    Reaction:  Blurred vision and sweating of feet     Outpatient Encounter Medications as of 06/30/2017  Medication Sig  .  acetaminophen (TYLENOL) 325 MG tablet Take 650 mg by mouth every 6 (six) hours as needed.  Marland Kitchen albuterol (PROVENTIL HFA;VENTOLIN HFA) 108 (90 Base) MCG/ACT inhaler Inhale 1-2 puffs into the lungs every 6 (six) hours as needed for wheezing or shortness of breath.  Marland Kitchen amLODipine (NORVASC) 5 MG tablet Take 2.5 mg by mouth daily.   Marland Kitchen atenolol (TENORMIN) 50 MG tablet Take 25 mg by mouth daily.   . budesonide-formoterol (SYMBICORT) 160-4.5 MCG/ACT inhaler Inhale 2 puffs into the lungs 2 (two) times daily.  Marland Kitchen lidocaine (LIDODERM) 5 % Place 1 patch onto the skin every 12 (twelve) hours. Remove & Discard patch within 12 hours or as directed by MD  . loperamide (IMODIUM A-D) 2 MG tablet Take 2 mg by mouth 4 (four) times daily as needed for diarrhea or loose stools.  . nitroGLYCERIN (NITROSTAT) 0.4 MG SL tablet Place 1 tablet under the tongue as directed. Place 1 tablet under the tongue every five minutes as needed for emergency chest pain  . omeprazole (PRILOSEC) 40 MG capsule Take 40 mg by mouth daily as needed (for acid reflux).   . Probiotic Product (RISA-BID PROBIOTIC) TABS Take 1 tablet by mouth twice a day  . timolol (BETIMOL) 0.5 % ophthalmic solution Place 1 drop into both eyes 2 (two) times daily.   No facility-administered encounter medications on file as of 06/30/2017.     Review of Systems   This is limited secondary to dementia.  In general she is not complaining of pain at this time does not appear to be in any distress.  Skin is not complaining of any itching or rashes.  Head ears eyes nose mouth and throat has prescription lenses is not complaining of visual changes or sore throat.  Respiratory does not complain of shortness of breath or cough.  Cardiac denies chest pain today.  GI does not complain of abdominal discomfort currently nausea or vomiting.  Musculoskeletal does not complain of joint pain at this time her son feels that  at [employer] she does have some neck  pain.  Neurologic is not complaining of dizziness headache or syncope.  And psych does have some element of dementia but does not appear to be anxious today     There is no immunization history on file for this patient. Pertinent  Health Maintenance Due  Topic Date Due  . INFLUENZA VACCINE  07/16/2017 (Originally 11/11/2016)  . DEXA SCAN  07/16/2017 (Originally 01/02/1996)  . PNA vac Low Risk Adult (1 of 2 - PCV13) 07/16/2017 (Originally 01/02/1996)   Fall Risk  07/18/2015 03/19/2015 02/12/2015  Falls in the past year? Yes No Yes  Number falls in past yr: 1 - 2 or more  Injury with Fall? No - Yes  Comment - - left lower leg   Risk for fall due to : (No Data) - -  Risk for fall due to:  Comment accidently hit by door that pushed her down - -   Functional Status Survey:     She is afebrile pulse of 70 respirations of 18 blood pressure taken manually 102/58 Physical Exam   In general this is a somewhat frail elderly female in no distress lying comfortably in bed.  She is visiting with her son.  Her skin is warm and dry.  Oropharynx is clear mucous membranes appear fairly moist.  Eyes she has prescription lenses visual acuity appears intact.  Chest is clear to auscultation with shallow air entry there is no labored breathing.  Heart is regular rate and rhythm without murmur gallop or rub she does not have significant lower extremity edema.  Her abdomen is soft does not appear to be tender there are positive bowel sounds.  Musculoskeletal continues with general frailty is able to move all 4 extremities with gentle palpation of her pelvic area she does not complain of pain   Holds her neck in somewhat of a contracted position but at this point does not complain of acute pain.  Neurologic --  speech is clear she does not speak much she is alert.  Appears able to move her extremities at baseline with no lateralizing findings.  Psych she is oriented to self does and so  straightforward questions appropriately but does not speak much    Labs reviewed: Recent Labs    06/16/17 0712 06/29/17 0742 06/29/17 1058  NA 133* 134* 134*  K 4.2 3.8 3.7  CL 98* 97* 98*  CO2 25 24 26   GLUCOSE 107* 144* 124*  BUN 15 18 19   CREATININE 0.53 0.58 0.63  CALCIUM 9.0 8.8* 8.7*  MG  --  1.9  --    Recent Labs    06/16/17 0712 06/29/17 1058  AST 21 20  ALT 11* 10*  ALKPHOS 104 86  BILITOT 1.2 0.8  PROT 8.3* 7.9  ALBUMIN 3.3* 3.1*   Recent Labs    05/01/17 0900 05/11/17 0709 05/21/17 0733 06/16/17 0712 06/29/17 1058  WBC 14.7* 11.3* 7.4 8.2 10.1  NEUTROABS 10.4* 8.2*  --   --  7.1  HGB 10.3* 11.0* 11.6* 12.2 12.3  HCT 32.1* 35.1* 37.2 39.5 39.3  MCV 95.8 96.2 95.4 92.3 91.8  PLT 442* 770* 354 428* 360   Lab Results  Component Value Date   TSH 1.419 06/16/2017   No results found for: HGBA1C No results found for: CHOL, HDL, LDLCALC, LDLDIRECT, TRIG, CHOLHDL  Significant Diagnostic Results in last 30 days:  Dg Chest 2 View  Result Date: 06/29/2017 CLINICAL DATA:  Chest pain EXAM: CHEST - 2 VIEW COMPARISON:  April 22, 2017 FINDINGS: There is no edema or consolidation. Heart size and pulmonary vascularity are normal. No adenopathy. There is aortic atherosclerosis. Bones appear osteoporotic. There is evidence of an old healed fracture of the proximal right humerus with remodeling. IMPRESSION: No edema or consolidation. Stable cardiac silhouette. There is aortic atherosclerosis. Old trauma with remodeling proximal right humerus. Aortic Atherosclerosis (ICD10-I70.0). Electronically Signed   By: Bretta BangWilliam  Woodruff III M.D.   On: 06/29/2017 10:51    Assessment/Plan #1 chest pain prompting ER visit-apparently workup in the ER did not really show an acute process EKG was not concerning-chest x-ray did not show any acute process cardiac troponins apparently showed some variability But apparently reassuring before discharg At this point she is denying pain  appears to be comfortable vital signs appear to be stable  At this point continue to monitor. #  2 In regards to pain apparently insurance would not pay for her Lidoderm 5% patch-however I did speak with the clinical pharmacist and she suggested salon patches 4% with lidocaine which hopefully will be as effective will write an order for these to apply on 12 hours and off 12 hours   #3 hypertension-this does not really appear to be an issue she is on low-dose Norvasc secondary to systolic just over 100 today will DC the Norvasc and monitor.  Addendum---.  Later this afternoon patient's husband was visiting and it was noted the patient did have some increased anxiety-family and nursing did inquire about possibly having a as needed anxiety medication for situational anxiety.  I did assess patient she appeared to be stable but is somewhat anxious apparently this occurs at times--when she sees her husband prompting her to want to go home and becoming anxious  - will start Ativan 0.25 mg twice daily as needed and monitor.  During physical exam I did note her blood pressure was somewhat elevated at 140--- over 52- but I suspect this was more situational anxiety related and not persistent.  Again will monitor.  Also will start melatonin as needed 3 mg nightly per further discussion with her nurse this afternoon apparently she does at times have some trouble sleeping at night  [id number]-of note greater than 35 minutes spent assessing patient and then reassessing patient-discussing her status with nursing staff as well as with her son at bedside initially and with her husband on subsequent visit.  Note greater than 50% of time spent formulating and coordinating a plan of care with input as noted above  Is

## 2017-07-05 ENCOUNTER — Other Ambulatory Visit: Payer: Self-pay

## 2017-07-05 MED ORDER — LORAZEPAM 0.5 MG PO TABS: 0.2500 mg | ORAL_TABLET | Freq: Two times a day (BID) | ORAL | 0 refills | Status: DC | PRN

## 2017-07-05 NOTE — Telephone Encounter (Signed)
RX Fax for [facility]@ [phone]  

## 2017-07-15 DIAGNOSIS — I739 Peripheral vascular disease, unspecified: Secondary | ICD-10-CM | POA: Diagnosis not present

## 2017-07-15 DIAGNOSIS — R262 Difficulty in walking, not elsewhere classified: Secondary | ICD-10-CM | POA: Diagnosis not present

## 2017-07-15 DIAGNOSIS — L603 Nail dystrophy: Secondary | ICD-10-CM | POA: Diagnosis not present

## 2017-07-19 ENCOUNTER — Other Ambulatory Visit: Payer: Self-pay

## 2017-07-19 MED ORDER — LORAZEPAM 0.5 MG PO TABS: 0.2500 mg | ORAL_TABLET | Freq: Two times a day (BID) | ORAL | 0 refills | Status: DC | PRN

## 2017-07-19 NOTE — Telephone Encounter (Signed)
RX Fax for [facility]@ [phone]  

## 2017-07-26 ENCOUNTER — Encounter (HOSPITAL_COMMUNITY)
Admission: RE | Admit: 2017-07-26 | Discharge: 2017-07-26 | Disposition: A | Discharge status: Home / Self Care | Payer: Medicare Other | Source: Skilled Nursing Facility | Attending: Internal Medicine | Admitting: Internal Medicine

## 2017-07-26 DIAGNOSIS — G20 Parkinson's disease: Secondary | ICD-10-CM | POA: Insufficient documentation

## 2017-07-26 DIAGNOSIS — I10 Essential (primary) hypertension: Secondary | ICD-10-CM | POA: Diagnosis not present

## 2017-07-26 DIAGNOSIS — Z9181 History of falling: Secondary | ICD-10-CM | POA: Diagnosis not present

## 2017-07-26 DIAGNOSIS — N390 Urinary tract infection, site not specified: Secondary | ICD-10-CM | POA: Diagnosis not present

## 2017-07-26 LAB — URINALYSIS, ROUTINE W REFLEX MICROSCOPIC
BILIRUBIN URINE: NEGATIVE
Glucose, UA: NEGATIVE mg/dL
Ketones, ur: NEGATIVE mg/dL
LEUKOCYTES UA: NEGATIVE
NITRITE: NEGATIVE
PH: 6 (ref 5.0–8.0)
Protein, ur: NEGATIVE mg/dL
SPECIFIC GRAVITY, URINE: 1.02 (ref 1.005–1.030)

## 2017-07-26 LAB — URINALYSIS, MICROSCOPIC (REFLEX)

## 2017-07-28 LAB — URINE CULTURE: CULTURE: NO GROWTH

## 2017-08-03 DIAGNOSIS — Z7951 Long term (current) use of inhaled steroids: Secondary | ICD-10-CM | POA: Diagnosis not present

## 2017-08-03 DIAGNOSIS — H04123 Dry eye syndrome of bilateral lacrimal glands: Secondary | ICD-10-CM | POA: Diagnosis not present

## 2017-08-03 DIAGNOSIS — H401134 Primary open-angle glaucoma, bilateral, indeterminate stage: Secondary | ICD-10-CM | POA: Diagnosis not present

## 2017-08-03 DIAGNOSIS — Z961 Presence of intraocular lens: Secondary | ICD-10-CM | POA: Diagnosis not present

## 2017-08-05 ENCOUNTER — Non-Acute Institutional Stay (SKILLED_NURSING_FACILITY): Payer: Medicare Other

## 2017-08-05 DIAGNOSIS — R262 Difficulty in walking, not elsewhere classified: Secondary | ICD-10-CM | POA: Diagnosis not present

## 2017-08-05 DIAGNOSIS — Z0000 Encounter for general adult medical examination without abnormal findings: Secondary | ICD-10-CM

## 2017-08-05 DIAGNOSIS — I739 Peripheral vascular disease, unspecified: Secondary | ICD-10-CM | POA: Diagnosis not present

## 2017-08-05 DIAGNOSIS — B351 Tinea unguium: Secondary | ICD-10-CM | POA: Diagnosis not present

## 2017-08-05 NOTE — Progress Notes (Signed)
Subjective:   Sandra Davis is a 84 y.o. female who presents for an Initial Medicare Annual Wellness Visit at [facility];.incapacitated patient unable to answer questions appropriately      Objective:    Today's Vitals   08/05/17 1148  BP: 118/60  Pulse: 68  Temp: 98 F (36.7 C)  TempSrc: Oral  SpO2: 94%  Weight: 94 lb (42.6 kg)  Height: 5' (1.524 m)   Body mass index is 18.36 kg/m.  Advanced Directives 08/05/2017 06/30/2017 06/29/2017 06/28/2017 06/15/2017 04/26/2017 04/22/2017  Does Patient Have a Medical Advance Directive? Yes Yes Yes Yes Yes Yes Yes  Type of Advance Directive Out of facility DNR (pink MOST or yellow form) Out of facility DNR (pink MOST or yellow form) Out of facility DNR (pink MOST or yellow form) Out of facility DNR (pink MOST or yellow form) Out of facility DNR (pink MOST or yellow form) Out of facility DNR (pink MOST or yellow form) Healthcare Power of [place];Out of facility DNR (pink MOST or yellow form)  Does patient want to make changes to medical advance directive? No - Patient declined No - Patient declined No - Patient declined No - Patient declined No - Patient declined No - Patient declined No - Patient declined  Copy of Healthcare Power of Attorney in Chart? No - copy requested No - copy requested No - copy requested No - copy requested No - copy requested No - copy requested -  Would patient like information on creating a medical advance directive? - - - - - - -  Pre-existing out of facility DNR order (yellow form or pink MOST form) Yellow form placed in chart (order not valid for inpatient use) - - - - - -    Current Medications (verified) Outpatient Encounter Medications as of 08/05/2017  Medication Sig  . acetaminophen (TYLENOL) 325 MG tablet Take 650 mg by mouth every 6 (six) hours as needed.  Marland Kitchen. albuterol (PROVENTIL HFA;VENTOLIN HFA) 108 (90 Base) MCG/ACT inhaler Inhale 1-2 puffs into the lungs every 6 (six) hours as needed  for wheezing or shortness of breath.  Marland Kitchen. amLODipine (NORVASC) 5 MG tablet Take 2.5 mg by mouth daily.   Marland Kitchen. atenolol (TENORMIN) 50 MG tablet Take 25 mg by mouth daily.   . budesonide-formoterol (SYMBICORT) 160-4.5 MCG/ACT inhaler Inhale 2 puffs into the lungs 2 (two) times daily.  Marland Kitchen. lidocaine (LIDODERM) 5 % Place 1 patch onto the skin every 12 (twelve) hours. Remove & Discard patch within 12 hours or as directed by MD  . loperamide (IMODIUM A-D) 2 MG tablet Take 2 mg by mouth 4 (four) times daily as needed for diarrhea or loose stools.  Marland Kitchen. LORazepam (ATIVAN) 0.5 MG tablet Take 0.5 tablets (0.25 mg total) by mouth 2 (two) times daily as needed for anxiety.  . nitroGLYCERIN (NITROSTAT) 0.4 MG SL tablet Place 1 tablet under the tongue as directed. Place 1 tablet under the tongue every five minutes as needed for emergency chest pain  . omeprazole (PRILOSEC) 40 MG capsule Take 40 mg by mouth daily as needed (for acid reflux).   . Probiotic Product (RISA-BID PROBIOTIC) TABS Take 1 tablet by mouth twice a day  . timolol (BETIMOL) 0.5 % ophthalmic solution Place 1 drop into both eyes 2 (two) times daily.   No facility-administered encounter medications on file as of 08/05/2017.     Allergies (verified) Hydrochlorothiazide; Lisinopril; Other; and Spironolactone   History: Past Medical History:  Diagnosis Date  .  Arthritis    osteoporosis  . Back pain   . COPD (chronic obstructive pulmonary disease) (HCC)   . Gait instability   . Glaucoma   . Glaucoma   . Hypertension   . Neck pain   . Parkinson's disease (HCC)   . Tremor    Past Surgical History:  Procedure Laterality Date  . CATARACT EXTRACTION    . EYE SURGERY    . TONSILLECTOMY    . TUBAL LIGATION     Family History  Problem Relation Age of Onset  . Heart failure Mother    Social History   Socioeconomic History  . Marital status: Married    Spouse name: Normand Sloop  . Number of children: 5  . Years of education: 3  . Highest  education level: Not on file  Occupational History  . Not on file  Social Needs  . Financial resource strain: Not on file  . Food insecurity:    Worry: Not on file    Inability: Not on file  . Transportation needs:    Medical: Not on file    Non-medical: Not on file  Tobacco Use  . Smoking status: Never Smoker  . Smokeless tobacco: Never Used  Substance and Sexual Activity  . Alcohol use: No  . Drug use: No  . Sexual activity: Not on file  Lifestyle  . Physical activity:    Days per week: Not on file    Minutes per session: Not on file  . Stress: Not on file  Relationships  . Social connections:    Talks on phone: Not on file    Gets together: Not on file    Attends religious service: Not on file    Active member of club or organization: Not on file    Attends meetings of clubs or organizations: Not on file    Relationship status: Not on file  Other Topics Concern  . Not on file  Social History Narrative   Lives at home with husband, family   Caffeine use- tea 2 cups daily, decaf green tea     Tobacco Counseling Counseling given: Not Answered   Clinical Intake:  Pre-visit preparation completed: No  Pain : No/denies pain     Nutritional Risks: None Diabetes: No     Interpreter Needed?: No  Information entered by :: Tyron Russell, RN   Activities of Daily Living In your present state of health, do you have any difficulty performing the following activities: 08/05/2017 04/22/2017  Hearing? N N  Vision? N N  Difficulty concentrating or making decisions? Malvin Johns  Walking or climbing stairs? Y Y  Dressing or bathing? Y Y  Doing errands, shopping? Malvin Johns  Preparing Food and eating ? Y -  Using the Toilet? Y -  In the past six months, have you accidently leaked urine? Y -  Do you have problems with loss of bowel control? Y -  Managing your Medications? Y -  Managing your Finances? Y -  Housekeeping or managing your Housekeeping? Y -  Some recent data might be  hidden     Immunizations and Health Maintenance  There is no immunization history on file for this patient. Health Maintenance Due  Topic Date Due  . TETANUS/TDAP  01/01/1948  . DEXA SCAN  01/02/1996  . PNA vac Low Risk Adult (1 of 2 - PCV13) 01/02/1996    Patient Care Team: Estevan Oaks, NP as PCP - General (Nurse Practitioner)  Indicate any recent  Medical Services you may have received from other than [facility] providers in the past year (date may be approximate).     Assessment:   This is a routine wellness examination for Anelly.  Hearing/Vision screen No exam data present  Dietary issues and exercise activities discussed: Current Exercise Habits: The patient does not participate in regular exercise at present, Exercise limited by: orthopedic condition(s);neurologic condition(s)  Goals    None     Depression Screen PHQ 2/9 Scores 08/05/2017  PHQ - 2 Score 0    Fall Risk Fall Risk  08/05/2017 07/18/2015 03/19/2015 02/12/2015  Falls in the past year? Yes Yes No Yes  Number falls in past yr: 2 or more 1 - 2 or more  Injury with Fall? Yes No - Yes  Comment hip fracture - - left lower leg   Risk for fall due to : - (No Data) - -  Risk for fall due to: Comment - accidently hit by door that pushed her down - -    Is the patient's home free of loose throw rugs in walkways, pet beds, electrical cords, etc?   yes      Grab bars in the bathroom? yes      Handrails on the stairs?   yes      Adequate lighting?   yes  Timed Get Up and Go Performed unable to perform  Cognitive Function:     6CIT Screen 08/05/2017  What Year? 4 points  What month? 3 points  What time? 3 points  Count back from 20 0 points  Months in reverse 4 points  Repeat phrase 10 points  Total Score 24    Screening Tests Health Maintenance  Topic Date Due  . TETANUS/TDAP  01/01/1950  . DEXA SCAN  01/02/1996  . PNA vac Low Risk Adult (1 of 2 - PCV13) 01/02/1996  . INFLUENZA VACCINE   11/11/2017    Qualifies for Shingles Vaccine? Not in past records  Cancer Screenings: Lung: Low Dose CT Chest recommended if Age 3-80 years, 30 pack-year currently smoking OR have quit w/in 15years. Patient does not qualify. Breast: Up to date on Mammogram? Yes   Up to date of Bone Density/Dexa? No, excluded Colorectal: up to date  Additional Screenings:  Hepatitis C Screening: declined TDAP due-ordered PNA 13 due-ordered    Plan:    I have personally reviewed and addressed the Medicare Annual Wellness questionnaire and have noted the following in the patient's chart:  A. Medical and social history B. Use of alcohol, tobacco or illicit drugs  C. Current medications and supplements D. Functional ability and status E.  Nutritional status F.  Physical activity G. Advance directives H. List of other physicians I.  Hospitalizations, surgeries, and ER visits in previous 12 months J.  Vitals K. Screenings to include hearing, vision, cognitive, depression L. Referrals and appointments - none  In addition, I am unable to review and discuss with incapacitated patient certain preventive protocols, quality metrics, and best practice recommendations. A written personalized care plan for preventive services as well as general preventive health recommendations were provided to patient.   See attached scanned questionnaire for additional information.   Signed,   Tyron Russell, RN Nurse Health Advisor  Patient Concerns: None

## 2017-08-05 NOTE — Patient Instructions (Signed)
[name] , Thank you for taking time to come for your Medicare Wellness Visit. I appreciate your ongoing commitment to your health goals. Please review the following plan we discussed and let me know if I can assist you in the future.   Screening recommendations/referrals: Colonoscopy excluded, pt is over age 82 Mammogram excluded, pt is over age 82 Bone Density excluded, due to dementia and physical state Recommended yearly ophthalmology/optometry visit for glaucoma screening and checkup Recommended yearly dental visit for hygiene and checkup  Vaccinations: Influenza vaccine due 2019 fall season Pneumococcal vaccine due-ordered Tdap vaccine due-ordered Shingles vaccine not in past records    Advanced directives: Need a copy of living will and health care power of attorney for chart  Conditions/risks identified: none  Next appointment: Dr. Chales Davis makes rounds   Preventive Care 65 Years and Older, Female Preventive care refers to lifestyle choices and visits with your health care provider that can promote health and wellness. What does preventive care include?  A yearly physical exam. This is also called an annual well check.  Dental exams once or twice a year.  Routine eye exams. Ask your health care provider how often you should have your eyes checked.  Personal lifestyle choices, including:  Daily care of your teeth and gums.  Regular physical activity.  Eating a healthy diet.  Avoiding tobacco and drug use.  Limiting alcohol use.  Practicing safe sex.  Taking low-dose aspirin every day.  Taking vitamin and mineral supplements as recommended by your health care provider. What happens during an annual well check? The services and screenings done by your health care provider during your annual well check will depend on your age, overall health, lifestyle risk factors, and family history of disease. Counseling  Your health care provider may ask you questions about  your:  Alcohol use.  Tobacco use.  Drug use.  Emotional well-being.  Home and relationship well-being.  Sexual activity.  Eating habits.  History of falls.  Memory and ability to understand (cognition).  Work and work Astronomerenvironment.  Reproductive health. Screening  You may have the following tests or measurements:  Height, weight, and BMI.  Blood pressure.  Lipid and cholesterol levels. These may be checked every 5 years, or more frequently if you are over 82 years old.  Skin check.  Lung cancer screening. You may have this screening every year starting at age 82 if you have a 30-pack-year history of smoking and currently smoke or have quit within the past 15 years.  Fecal occult blood test (FOBT) of the stool. You may have this test every year starting at age 82.  Flexible sigmoidoscopy or colonoscopy. You may have a sigmoidoscopy every 5 years or a colonoscopy every 10 years starting at age 82.  Hepatitis C blood test.  Hepatitis B blood test.  Sexually transmitted disease (STD) testing.  Diabetes screening. This is done by checking your blood sugar (glucose) after you have not eaten for a while (fasting). You may have this done every 1-3 years.  Bone density scan. This is done to screen for osteoporosis. You may have this done starting at age 165.  Mammogram. This may be done every 1-2 years. Talk to your health care provider about how often you should have regular mammograms. Talk with your health care provider about your test results, treatment options, and if necessary, the need for more tests. Vaccines  Your health care provider may recommend certain vaccines, such as:  Influenza vaccine. This is  recommended every year.  Tetanus, diphtheria, and acellular pertussis (Tdap, Td) vaccine. You may need a Td booster every 10 years.  Zoster vaccine. You may need this after age 29.  Pneumococcal 13-valent conjugate (PCV13) vaccine. One dose is recommended  after age 1.  Pneumococcal polysaccharide (PPSV23) vaccine. One dose is recommended after age 55. Talk to your health care provider about which screenings and vaccines you need and how often you need them. This information is not intended to replace advice given to you by your health care provider. Make sure you discuss any questions you have with your health care provider. Document Released: 04/26/2015 Document Revised: 12/18/2015 Document Reviewed: 01/29/2015 Elsevier Interactive Patient Education  2017 [place] Prevention in the Home Falls can cause injuries. They can happen to people of all ages. There are many things you can do to make your home safe and to help prevent falls. What can I do on the outside of my home?  Regularly fix the edges of walkways and driveways and fix any cracks.  Remove anything that might make you trip as you walk through a door, such as a raised step or threshold.  Trim any bushes or trees on the path to your home.  Use bright outdoor lighting.  Clear any walking paths of anything that might make someone trip, such as rocks or tools.  Regularly check to see if handrails are loose or broken. Make sure that both sides of any steps have handrails.  Any raised decks and porches should have guardrails on the edges.  Have any leaves, snow, or ice cleared regularly.  Use sand or salt on walking paths during winter.  Clean up any spills in your garage right away. This includes oil or grease spills. What can I do in the bathroom?  Use night lights.  Install grab bars by the toilet and in the tub and shower. Do not use towel bars as grab bars.  Use non-skid mats or decals in the tub or shower.  If you need to sit down in the shower, use a plastic, non-slip stool.  Keep the floor dry. Clean up any water that spills on the floor as soon as it happens.  Remove soap buildup in the tub or shower regularly.  Attach bath mats securely with  double-sided non-slip rug tape.  Do not have throw rugs and other things on the floor that can make you trip. What can I do in the bedroom?  Use night lights.  Make sure that you have a light by your bed that is easy to reach.  Do not use any sheets or blankets that are too big for your bed. They should not hang down onto the floor.  Have a firm chair that has side arms. You can use this for support while you get dressed.  Do not have throw rugs and other things on the floor that can make you trip. What can I do in the kitchen?  Clean up any spills right away.  Avoid walking on wet floors.  Keep items that you use a lot in easy-to-reach places.  If you need to reach something above you, use a strong step stool that has a grab bar.  Keep electrical cords out of the way.  Do not use floor polish or wax that makes floors slippery. If you must use wax, use non-skid floor wax.  Do not have throw rugs and other things on the floor that can make you trip.  What can I do with my stairs?  Do not leave any items on the stairs.  Make sure that there are handrails on both sides of the stairs and use them. Fix handrails that are broken or loose. Make sure that handrails are as long as the stairways.  Check any carpeting to make sure that it is firmly attached to the stairs. Fix any carpet that is loose or worn.  Avoid having throw rugs at the top or bottom of the stairs. If you do have throw rugs, attach them to the floor with carpet tape.  Make sure that you have a light switch at the top of the stairs and the bottom of the stairs. If you do not have them, ask someone to add them for you. What else can I do to help prevent falls?  Wear shoes that:  Do not have high heels.  Have rubber bottoms.  Are comfortable and fit you well.  Are closed at the toe. Do not wear sandals.  If you use a stepladder:  Make sure that it is fully opened. Do not climb a closed stepladder.  Make  sure that both sides of the stepladder are locked into place.  Ask someone to hold it for you, if possible.  Clearly mark and make sure that you can see:  Any grab bars or handrails.  First and last steps.  Where the edge of each step is.  Use tools that help you move around (mobility aids) if they are needed. These include:  Canes.  Walkers.  Scooters.  Crutches.  Turn on the lights when you go into a dark area. Replace any light bulbs as soon as they burn out.  Set up your furniture so you have a clear path. Avoid moving your furniture around.  If any of your floors are uneven, fix them.  If there are any pets around you, be aware of where they are.  Review your medicines with your doctor. Some medicines can make you feel dizzy. This can increase your chance of falling. Ask your doctor what other things that you can do to help prevent falls. This information is not intended to replace advice given to you by your health care provider. Make sure you discuss any questions you have with your health care provider. Document Released: 01/24/2009 Document Revised: 09/05/2015 Document Reviewed: 05/04/2014 Elsevier Interactive Patient Education  2017 [employer].

## 2017-08-12 ENCOUNTER — Other Ambulatory Visit (HOSPITAL_COMMUNITY)
Admission: AD | Admit: 2017-08-12 | Discharge: 2017-08-12 | Disposition: A | Discharge status: Home / Self Care | Payer: Medicare Other | Source: Skilled Nursing Facility | Attending: Internal Medicine | Admitting: Internal Medicine

## 2017-08-12 DIAGNOSIS — N390 Urinary tract infection, site not specified: Secondary | ICD-10-CM | POA: Diagnosis not present

## 2017-08-12 LAB — URINALYSIS, ROUTINE W REFLEX MICROSCOPIC
Bilirubin Urine: NEGATIVE
Glucose, UA: NEGATIVE mg/dL
KETONES UR: NEGATIVE mg/dL
Nitrite: NEGATIVE
PH: 7 (ref 5.0–8.0)
Protein, ur: 30 mg/dL — AB
Specific Gravity, Urine: 1.017 (ref 1.005–1.030)

## 2017-08-14 ENCOUNTER — Encounter (HOSPITAL_COMMUNITY)
Admission: AD | Admit: 2017-08-14 | Discharge: 2017-08-14 | Disposition: A | Discharge status: Home / Self Care | Payer: Medicare Other | Source: Skilled Nursing Facility | Attending: Internal Medicine | Admitting: Internal Medicine

## 2017-08-14 DIAGNOSIS — N390 Urinary tract infection, site not specified: Secondary | ICD-10-CM | POA: Insufficient documentation

## 2017-08-14 LAB — URINALYSIS, ROUTINE W REFLEX MICROSCOPIC
BACTERIA UA: NONE SEEN
Bilirubin Urine: NEGATIVE
Glucose, UA: NEGATIVE mg/dL
Ketones, ur: NEGATIVE mg/dL
NITRITE: NEGATIVE
PH: 7 (ref 5.0–8.0)
Protein, ur: 100 mg/dL — AB
SPECIFIC GRAVITY, URINE: 1.013 (ref 1.005–1.030)

## 2017-08-14 LAB — URINE CULTURE

## 2017-08-16 LAB — URINE CULTURE

## 2017-08-17 ENCOUNTER — Non-Acute Institutional Stay (SKILLED_NURSING_FACILITY): Payer: Medicare Other | Admitting: Internal Medicine

## 2017-08-17 ENCOUNTER — Encounter: Payer: Self-pay | Admitting: Internal Medicine

## 2017-08-17 DIAGNOSIS — I10 Essential (primary) hypertension: Secondary | ICD-10-CM

## 2017-08-17 DIAGNOSIS — F0281 Dementia in other diseases classified elsewhere with behavioral disturbance: Secondary | ICD-10-CM | POA: Diagnosis not present

## 2017-08-17 DIAGNOSIS — B356 Tinea cruris: Secondary | ICD-10-CM | POA: Diagnosis not present

## 2017-08-17 DIAGNOSIS — F02818 Dementia in other diseases classified elsewhere, unspecified severity, with other behavioral disturbance: Secondary | ICD-10-CM

## 2017-08-17 DIAGNOSIS — N760 Acute vaginitis: Secondary | ICD-10-CM | POA: Diagnosis not present

## 2017-08-17 DIAGNOSIS — G20 Parkinson's disease: Secondary | ICD-10-CM

## 2017-08-17 NOTE — Progress Notes (Signed)
Location:   [facility] Room Number: 152/D Place of Service:  SNF (31) Provider:  Clayborne Dana, NP  Patient Care Team: Estevan Oaks, NP as PCP - General (Nurse Practitioner)  Extended Emergency Contact Information Primary Emergency Contact: Ralph,[name] Address: [address]          [place], [place] [address] [place] Home Phone: [phone] Relation: Spouse Secondary Emergency Contact: Muccio,[name] Address: [address]          [place], [place] [address] [place] Home Phone: [phone] Mobile Phone: [phone] Relation: Son  Code Status:  DNR Goals of care: Advanced Directive information Advanced Directives 08/17/2017  Does Patient Have a Medical Advance Directive? Yes  Type of Advance Directive Out of facility DNR (pink MOST or yellow form)  Does patient want to make changes to medical advance directive? No - Patient declined  Copy of Healthcare Power of Attorney in Chart? No - copy requested  Would patient like information on creating a medical advance directive? -  Pre-existing out of facility DNR order (yellow form or pink MOST form) -     Chief Complaint  Patient presents with  . Acute Visit    Patients c/o Disuria and Insomnia    HPI:  Pt is a 84 y.o. female seen today for an acute visit for 'Hurts in her Vaginal area especially when she passes Urine  Patient has a history of hypertension, Parkinson disease with hallucinations, recurrent falls, COPD,Humerus fracture 09/18, Low back pain due to compression Fracture, Osteoporosis, Primary open angle Glaucoma Admitted to SNF after Closed Bilateral Fracture of Pubic Rami  . Patient unable to tell me much history. She usually switches to Spanish and it is hard to understand her. Her husband has been concerned as she has been c/o hurting during Micturition. Her Urine culture was Negative He also wanted her to get something for  anxiety and help with Sleep at night. She has not c/o Any abdominal Pain. No fever or chills. Her appetite is good.   Past Medical History:  Diagnosis Date  . Arthritis    osteoporosis  . Back pain   . COPD (chronic obstructive pulmonary disease) (HCC)   . Gait instability   . Glaucoma   . Glaucoma   . Hypertension   . Neck pain   . Parkinson's disease (HCC)   . Tremor    Past Surgical History:  Procedure Laterality Date  . CATARACT EXTRACTION    . EYE SURGERY    . TONSILLECTOMY    . TUBAL LIGATION      Allergies  Allergen Reactions  . Hydrochlorothiazide Other (See Comments)    Reaction:  Dizziness   . Lisinopril Cough  . Other Other (See Comments)    Pt states that she is allergic to multiple BP meds -- does not recall the names.   Reaction:  Dizziness   . Spironolactone Other (See Comments)    Reaction:  Blurred vision and sweating of feet     Outpatient Encounter Medications as of 08/17/2017  Medication Sig  . acetaminophen (TYLENOL) 325 MG tablet Take 650 mg by mouth every 6 (six) hours as needed.  Marland Kitchen albuterol (PROVENTIL HFA;VENTOLIN HFA) 108 (90 Base) MCG/ACT inhaler Inhale 1-2 puffs into the lungs every 6 (six) hours as needed for wheezing or shortness of breath.  Marland Kitchen aspirin EC 81 MG tablet Take 81 mg by mouth daily.  Marland Kitchen atenolol (TENORMIN) 50 MG tablet  Take 25 mg by mouth daily.   . budesonide-formoterol (SYMBICORT) 160-4.5 MCG/ACT inhaler Inhale 2 puffs into the lungs 2 (two) times daily.  Marland Kitchen loperamide (IMODIUM A-D) 2 MG tablet Take 2 mg by mouth 4 (four) times daily as needed for diarrhea or loose stools.  . Melatonin 3 MG TABS Take 3 mg by mouth at bedtime.  . Menthol 7.5 % PTCH Apply 1 patch topically daily.  . nitroGLYCERIN (NITROSTAT) 0.4 MG SL tablet Place 1 tablet under the tongue as directed. Place 1 tablet under the tongue every five minutes as needed for emergency chest pain  . omeprazole (PRILOSEC) 40 MG capsule Take 40 mg by mouth daily as needed (for  acid reflux).   . Polyvinyl Alcohol-Povidone PF 1.4-0.6 % SOLN Apply 1 drop to eye 2 (two) times daily as needed.  . Probiotic Product (RISA-BID PROBIOTIC) TABS Take 1 tablet by mouth twice a day  . rotigotine (NEUPRO) 6 MG/24HR Place 1 patch onto the skin daily.  . timolol (BETIMOL) 0.5 % ophthalmic solution Place 1 drop into both eyes 2 (two) times daily.  . [DISCONTINUED] amLODipine (NORVASC) 5 MG tablet Take 2.5 mg by mouth daily.   . [DISCONTINUED] lidocaine (LIDODERM) 5 % Place 1 patch onto the skin every 12 (twelve) hours. Remove & Discard patch within 12 hours or as directed by MD  . [DISCONTINUED] LORazepam (ATIVAN) 0.5 MG tablet Take 0.5 tablets (0.25 mg total) by mouth 2 (two) times daily as needed for anxiety.   No facility-administered encounter medications on file as of 08/17/2017.      Review of Systems  Unable to perform ROS: Dementia     There is no immunization history on file for this patient. Pertinent  Health Maintenance Due  Topic Date Due  . DEXA SCAN  09/17/2017 (Originally 01/02/1996)  . PNA vac Low Risk Adult (1 of 2 - PCV13) 09/17/2017 (Originally 01/02/1996)  . INFLUENZA VACCINE  11/11/2017   Fall Risk  08/05/2017 07/18/2015 03/19/2015 02/12/2015  Falls in the past year? Yes Yes No Yes  Number falls in past yr: 2 or more 1 - 2 or more  Injury with Fall? Yes No - Yes  Comment hip fracture - - left lower leg   Risk for fall due to : - (No Data) - -  Risk for fall due to: Comment - accidently hit by door that pushed her down - -   Functional Status Survey:    There were no vitals filed for this visit. There is no height or weight on file to calculate BMI. Physical Exam  Constitutional: She appears well-developed.  HENT:  Head: Normocephalic.  Mouth/Throat: Oropharynx is clear and moist.  Eyes: Pupils are equal, round, and reactive to light.  Neck: Neck supple.  Cardiovascular: Normal rate, regular rhythm and normal heart sounds.  Pulmonary/Chest: Effort  normal and breath sounds normal. No stridor. No respiratory distress. She has no wheezes.  Abdominal: Bowel sounds are normal. She exhibits no distension. There is no tenderness. There is no guarding.  Genitourinary:  Genitourinary Comments: Patient has Severe Vaginitis with Rash around her Inner thigh area with White discharge  Musculoskeletal: She exhibits no edema.  Neurological: She is alert.  Skin: Skin is warm and dry.  Psychiatric: She has a normal mood and affect. Her behavior is normal.    Labs reviewed: Recent Labs    06/16/17 0712 06/29/17 0742 06/29/17 1058  NA 133* 134* 134*  K 4.2 3.8 3.7  CL 98* 97*  98*  CO2 GLUCOSE 107* 144* 124*  BUN CREATININE 0.53 0.58 0.63  CALCIUM 9.0 8.8* 8.7*  MG  --  1.9  --    Recent Labs    06/16/17 0712 06/29/17 1058  AST 21 20  ALT 11* 10*  ALKPHOS 104 86  BILITOT 1.2 0.8  PROT 8.3* 7.9  ALBUMIN 3.3* 3.1*   Recent Labs    05/01/17 0900 05/11/17 0709 05/21/17 0733 06/16/17 0712 06/29/17 1058  WBC 14.7* 11.3* 7.4 8.2 10.1  NEUTROABS 10.4* 8.2*  --   --  7.1  HGB 10.3* 11.0* 11.6* 12.2 12.3  HCT 32.1* 35.1* 37.2 39.5 39.3  MCV 95.8 96.2 95.4 92.3 91.8  PLT 442* 770* 354 428* 360   Lab Results  Component Value Date   TSH 1.419 06/16/2017   No results found for: HGBA1C No results found for: CHOL, HDL, LDLCALC, LDLDIRECT, TRIG, CHOLHDL  Significant Diagnostic Results in last 30 days:  No results found.  Assessment/Plan  Fungal Vaginitis Will start her on Diflucan 100 mg for 7 days Nystatin Cream  Hypertension Patient is on Toprol. She was taken off Norvasc recently.due to Low BP Will continue to monitor. Anxiety and Insomnia Will restart her on Ativan PRN. Continue Melatonin Family/ staff Communication:   Labs/tests ordered:   Total time spent in this patient care encounter was 25_ minutes; greater than 50% of the visit spent counseling patient, reviewing records , Labs and  coordinating care for problems addressed at this encounter.

## 2017-08-18 ENCOUNTER — Other Ambulatory Visit: Payer: Self-pay

## 2017-08-18 MED ORDER — LORAZEPAM 0.5 MG PO TABS: 0.2500 mg | ORAL_TABLET | Freq: Two times a day (BID) | ORAL | 0 refills | Status: DC | PRN

## 2017-08-18 NOTE — Telephone Encounter (Signed)
RX Fax for [facility]@ [phone]  

## 2017-08-20 ENCOUNTER — Emergency Department (HOSPITAL_COMMUNITY): Payer: Medicare Other

## 2017-08-20 ENCOUNTER — Inpatient Hospital Stay (HOSPITAL_COMMUNITY)
Admission: EM | Admit: 2017-08-20 | Discharge: 2017-08-24 | DRG: 690 | Disposition: A | Discharge status: Skilled Nursing Facility | Payer: Medicare Other | Attending: Internal Medicine | Admitting: Internal Medicine

## 2017-08-20 ENCOUNTER — Encounter: Payer: Self-pay | Admitting: Internal Medicine

## 2017-08-20 ENCOUNTER — Other Ambulatory Visit: Payer: Self-pay

## 2017-08-20 ENCOUNTER — Encounter (HOSPITAL_COMMUNITY): Payer: Self-pay | Admitting: Emergency Medicine

## 2017-08-20 ENCOUNTER — Non-Acute Institutional Stay (SKILLED_NURSING_FACILITY): Payer: Medicare Other | Admitting: Internal Medicine

## 2017-08-20 DIAGNOSIS — K5641 Fecal impaction: Secondary | ICD-10-CM | POA: Diagnosis not present

## 2017-08-20 DIAGNOSIS — R310 Gross hematuria: Secondary | ICD-10-CM | POA: Diagnosis not present

## 2017-08-20 DIAGNOSIS — R262 Difficulty in walking, not elsewhere classified: Secondary | ICD-10-CM | POA: Diagnosis not present

## 2017-08-20 DIAGNOSIS — K219 Gastro-esophageal reflux disease without esophagitis: Secondary | ICD-10-CM | POA: Diagnosis present

## 2017-08-20 DIAGNOSIS — Z515 Encounter for palliative care: Secondary | ICD-10-CM

## 2017-08-20 DIAGNOSIS — F0390 Unspecified dementia without behavioral disturbance: Secondary | ICD-10-CM | POA: Diagnosis not present

## 2017-08-20 DIAGNOSIS — R1084 Generalized abdominal pain: Secondary | ICD-10-CM

## 2017-08-20 DIAGNOSIS — Z8249 Family history of ischemic heart disease and other diseases of the circulatory system: Secondary | ICD-10-CM

## 2017-08-20 DIAGNOSIS — Z79899 Other long term (current) drug therapy: Secondary | ICD-10-CM

## 2017-08-20 DIAGNOSIS — F0280 Dementia in other diseases classified elsewhere without behavioral disturbance: Secondary | ICD-10-CM | POA: Diagnosis present

## 2017-08-20 DIAGNOSIS — Z0000 Encounter for general adult medical examination without abnormal findings: Secondary | ICD-10-CM

## 2017-08-20 DIAGNOSIS — I10 Essential (primary) hypertension: Secondary | ICD-10-CM | POA: Diagnosis present

## 2017-08-20 DIAGNOSIS — N390 Urinary tract infection, site not specified: Secondary | ICD-10-CM | POA: Diagnosis not present

## 2017-08-20 DIAGNOSIS — G20 Parkinson's disease: Secondary | ICD-10-CM | POA: Diagnosis present

## 2017-08-20 DIAGNOSIS — R319 Hematuria, unspecified: Secondary | ICD-10-CM | POA: Diagnosis not present

## 2017-08-20 DIAGNOSIS — J449 Chronic obstructive pulmonary disease, unspecified: Secondary | ICD-10-CM | POA: Diagnosis present

## 2017-08-20 DIAGNOSIS — F419 Anxiety disorder, unspecified: Secondary | ICD-10-CM | POA: Diagnosis present

## 2017-08-20 DIAGNOSIS — Z4789 Encounter for other orthopedic aftercare: Secondary | ICD-10-CM | POA: Diagnosis not present

## 2017-08-20 DIAGNOSIS — N760 Acute vaginitis: Secondary | ICD-10-CM

## 2017-08-20 DIAGNOSIS — Z66 Do not resuscitate: Secondary | ICD-10-CM | POA: Diagnosis present

## 2017-08-20 DIAGNOSIS — N939 Abnormal uterine and vaginal bleeding, unspecified: Secondary | ICD-10-CM

## 2017-08-20 DIAGNOSIS — K6289 Other specified diseases of anus and rectum: Secondary | ICD-10-CM

## 2017-08-20 DIAGNOSIS — R1030 Lower abdominal pain, unspecified: Secondary | ICD-10-CM | POA: Diagnosis not present

## 2017-08-20 DIAGNOSIS — G20A1 Parkinson's disease without dyskinesia, without mention of fluctuations: Secondary | ICD-10-CM | POA: Diagnosis present

## 2017-08-20 DIAGNOSIS — B964 Proteus (mirabilis) (morganii) as the cause of diseases classified elsewhere: Secondary | ICD-10-CM | POA: Diagnosis present

## 2017-08-20 DIAGNOSIS — H409 Unspecified glaucoma: Secondary | ICD-10-CM | POA: Diagnosis present

## 2017-08-20 DIAGNOSIS — F0281 Dementia in other diseases classified elsewhere with behavioral disturbance: Secondary | ICD-10-CM | POA: Diagnosis not present

## 2017-08-20 DIAGNOSIS — M810 Age-related osteoporosis without current pathological fracture: Secondary | ICD-10-CM | POA: Diagnosis present

## 2017-08-20 DIAGNOSIS — K5909 Other constipation: Secondary | ICD-10-CM

## 2017-08-20 DIAGNOSIS — Z7189 Other specified counseling: Secondary | ICD-10-CM

## 2017-08-20 DIAGNOSIS — M6281 Muscle weakness (generalized): Secondary | ICD-10-CM | POA: Diagnosis not present

## 2017-08-20 DIAGNOSIS — Z7982 Long term (current) use of aspirin: Secondary | ICD-10-CM

## 2017-08-20 DIAGNOSIS — Z7951 Long term (current) use of inhaled steroids: Secondary | ICD-10-CM

## 2017-08-20 DIAGNOSIS — K5900 Constipation, unspecified: Secondary | ICD-10-CM | POA: Diagnosis not present

## 2017-08-20 DIAGNOSIS — R279 Unspecified lack of coordination: Secondary | ICD-10-CM | POA: Diagnosis not present

## 2017-08-20 DIAGNOSIS — S3282XD Multiple fractures of pelvis without disruption of pelvic ring, subsequent encounter for fracture with routine healing: Secondary | ICD-10-CM | POA: Diagnosis not present

## 2017-08-20 HISTORY — DX: Unspecified dementia, unspecified severity, without behavioral disturbance, psychotic disturbance, mood disturbance, and anxiety: F03.90

## 2017-08-20 LAB — COMPREHENSIVE METABOLIC PANEL
ALBUMIN: 3.5 g/dL (ref 3.5–5.0)
ALT: 16 U/L (ref 14–54)
ANION GAP: 7 (ref 5–15)
AST: 27 U/L (ref 15–41)
Alkaline Phosphatase: 89 U/L (ref 38–126)
BILIRUBIN TOTAL: 1 mg/dL (ref 0.3–1.2)
BUN: 21 mg/dL — ABNORMAL HIGH (ref 6–20)
CO2: 29 mmol/L (ref 22–32)
Calcium: 9.3 mg/dL (ref 8.9–10.3)
Chloride: 99 mmol/L — ABNORMAL LOW (ref 101–111)
Creatinine, Ser: 0.82 mg/dL (ref 0.44–1.00)
Glucose, Bld: 101 mg/dL — ABNORMAL HIGH (ref 65–99)
POTASSIUM: 3.7 mmol/L (ref 3.5–5.1)
Sodium: 135 mmol/L (ref 135–145)
TOTAL PROTEIN: 8.4 g/dL — AB (ref 6.5–8.1)

## 2017-08-20 LAB — URINALYSIS, ROUTINE W REFLEX MICROSCOPIC
Bilirubin Urine: NEGATIVE
Glucose, UA: NEGATIVE mg/dL
Ketones, ur: NEGATIVE mg/dL
Nitrite: NEGATIVE
PH: 7 (ref 5.0–8.0)
Protein, ur: 100 mg/dL — AB
RBC / HPF: >50 RBC/hpf — ABNORMAL HIGH (ref 0–5)
SPECIFIC GRAVITY, URINE: 1.012 (ref 1.005–1.030)

## 2017-08-20 LAB — CBC WITH DIFFERENTIAL/PLATELET
BASOS PCT: 1 %
Basophils Absolute: 0.1 10*3/uL (ref 0.0–0.1)
Eosinophils Absolute: 0.6 10*3/uL (ref 0.0–0.7)
Eosinophils Relative: 6 %
HEMATOCRIT: 41.9 % (ref 36.0–46.0)
Hemoglobin: 13.4 g/dL (ref 12.0–15.0)
Lymphocytes Relative: 19 %
Lymphs Abs: 1.9 10*3/uL (ref 0.7–4.0)
MCH: 28.8 pg (ref 26.0–34.0)
MCHC: 32 g/dL (ref 30.0–36.0)
MCV: 90.1 fL (ref 78.0–100.0)
MONO ABS: 1.5 10*3/uL — AB (ref 0.1–1.0)
MONOS PCT: 14 %
NEUTROS ABS: 6.4 10*3/uL (ref 1.7–7.7)
NEUTROS PCT: 60 %
Platelets: 325 10*3/uL (ref 150–400)
RBC: 4.65 MIL/uL (ref 3.87–5.11)
RDW: 17.2 % — AB (ref 11.5–15.5)
WBC: 10.4 10*3/uL (ref 4.0–10.5)

## 2017-08-20 LAB — PROTIME-INR
INR: 0.89
Prothrombin Time: 11.9 seconds (ref 11.4–15.2)

## 2017-08-20 LAB — LIPASE, BLOOD: LIPASE: 80 U/L — AB (ref 11–51)

## 2017-08-20 MED ORDER — SODIUM CHLORIDE 0.9 % IV SOLN: 1.0000 g | Freq: Once | INTRAVENOUS | Status: DC

## 2017-08-20 MED ORDER — CIPROFLOXACIN IN D5W 400 MG/200ML IV SOLN
400.0000 mg | Freq: Two times a day (BID) | INTRAVENOUS | Status: DC
  Administered 2017-08-20 – 2017-08-23 (×6): 400 mg via INTRAVENOUS
  Filled 2017-08-20 (×6): qty 200

## 2017-08-20 MED ORDER — IOPAMIDOL (ISOVUE-300) INJECTION 61%
30.0000 mL | Freq: Once | INTRAVENOUS | Status: AC | PRN
  Administered 2017-08-20: 30 mL via ORAL

## 2017-08-20 MED ORDER — TIMOLOL HEMIHYDRATE 0.5 % OP SOLN
1.0000 [drp] | Freq: Two times a day (BID) | OPHTHALMIC | Status: DC
  Administered 2017-08-21 – 2017-08-24 (×4): 1 [drp] via OPHTHALMIC
  Filled 2017-08-20 (×5): qty 5

## 2017-08-20 MED ORDER — METRONIDAZOLE IN NACL 5-0.79 MG/ML-% IV SOLN
500.0000 mg | Freq: Once | INTRAVENOUS | Status: AC
  Administered 2017-08-20: 500 mg via INTRAVENOUS
  Filled 2017-08-20: qty 100

## 2017-08-20 MED ORDER — METRONIDAZOLE IN NACL 5-0.79 MG/ML-% IV SOLN
500.0000 mg | Freq: Three times a day (TID) | INTRAVENOUS | Status: DC
  Administered 2017-08-21 – 2017-08-23 (×7): 500 mg via INTRAVENOUS
  Filled 2017-08-20 (×8): qty 100

## 2017-08-20 MED ORDER — SODIUM CHLORIDE 0.9 % IV SOLN
INTRAVENOUS | Status: DC
  Administered 2017-08-21 – 2017-08-23 (×3): via INTRAVENOUS

## 2017-08-20 MED ORDER — ROTIGOTINE 6 MG/24HR TD PT24: 1.0000 | MEDICATED_PATCH | Freq: Every day | TRANSDERMAL | Status: DC

## 2017-08-20 MED ORDER — MOMETASONE FURO-FORMOTEROL FUM 200-5 MCG/ACT IN AERO
2.0000 | INHALATION_SPRAY | Freq: Two times a day (BID) | RESPIRATORY_TRACT | Status: DC
  Administered 2017-08-21 – 2017-08-24 (×7): 2 via RESPIRATORY_TRACT
  Filled 2017-08-20: qty 8.8

## 2017-08-20 MED ORDER — LORAZEPAM 0.5 MG PO TABS
0.2500 mg | ORAL_TABLET | Freq: Two times a day (BID) | ORAL | Status: DC | PRN
  Administered 2017-08-21: 0.25 mg via ORAL
  Filled 2017-08-20: qty 1

## 2017-08-20 MED ORDER — ACETAMINOPHEN 325 MG PO TABS: 650.0000 mg | ORAL_TABLET | Freq: Four times a day (QID) | ORAL | Status: DC | PRN

## 2017-08-20 MED ORDER — SODIUM CHLORIDE 0.9 % IV BOLUS (SEPSIS)
500.0000 mL | Freq: Once | INTRAVENOUS | Status: AC
  Administered 2017-08-20: 500 mL via INTRAVENOUS

## 2017-08-20 MED ORDER — POLYETHYLENE GLYCOL 3350 17 G PO PACK
17.0000 g | PACK | Freq: Two times a day (BID) | ORAL | Status: DC
  Administered 2017-08-21 – 2017-08-22 (×3): 17 g via ORAL
  Filled 2017-08-20 (×3): qty 1

## 2017-08-20 MED ORDER — BOOST / RESOURCE BREEZE PO LIQD CUSTOM
1.0000 | Freq: Three times a day (TID) | ORAL | Status: DC
  Administered 2017-08-21 – 2017-08-24 (×8): 1 via ORAL

## 2017-08-20 MED ORDER — ACETAMINOPHEN 650 MG RE SUPP: 650.0000 mg | Freq: Four times a day (QID) | RECTAL | Status: DC | PRN

## 2017-08-20 MED ORDER — ONDANSETRON HCL 4 MG/2ML IJ SOLN
4.0000 mg | Freq: Four times a day (QID) | INTRAMUSCULAR | Status: DC | PRN
Start: 2017-08-20 — End: 2017-08-24

## 2017-08-20 MED ORDER — SODIUM CHLORIDE 0.9 % IV SOLN
1000.0000 mL | INTRAVENOUS | Status: DC
  Administered 2017-08-20: 1000 mL via INTRAVENOUS

## 2017-08-20 MED ORDER — ONDANSETRON HCL 4 MG PO TABS
4.0000 mg | ORAL_TABLET | Freq: Four times a day (QID) | ORAL | Status: DC | PRN
Start: 2017-08-20 — End: 2017-08-24

## 2017-08-20 MED ORDER — ALBUTEROL SULFATE (2.5 MG/3ML) 0.083% IN NEBU: 3.0000 mL | INHALATION_SOLUTION | Freq: Four times a day (QID) | RESPIRATORY_TRACT | Status: DC | PRN

## 2017-08-20 MED ORDER — ATENOLOL 25 MG PO TABS
25.0000 mg | ORAL_TABLET | Freq: Every day | ORAL | Status: DC
  Administered 2017-08-21 – 2017-08-24 (×4): 25 mg via ORAL
  Filled 2017-08-20 (×4): qty 1

## 2017-08-20 MED ORDER — MORPHINE SULFATE (PF) 4 MG/ML IV SOLN
4.0000 mg | Freq: Once | INTRAVENOUS | Status: AC
  Administered 2017-08-20: 4 mg via INTRAVENOUS
  Filled 2017-08-20: qty 1

## 2017-08-20 MED ORDER — IOPAMIDOL (ISOVUE-300) INJECTION 61%
100.0000 mL | Freq: Once | INTRAVENOUS | Status: AC | PRN
  Administered 2017-08-20: 80 mL via INTRAVENOUS

## 2017-08-20 NOTE — ED Provider Notes (Addendum)
[facility] EMERGENCY DEPARTMENT Provider Note   CSN: [id number] Arrival date & time: 08/20/17  1746     History   Chief Complaint Chief Complaint  Patient presents with  . Abdominal Pain    HPI Sandra Davis is a 84 y.o. female.  HPI Patient presents to the emergency room for evaluation of lower abdominal pain and hematuria.  Patient is a resident of the [facility].  Over the last couple days she has had lower abdominal pain and difficulty urinating.  She is had some generalized weakness.  No nausea or vomiting.  They noticed blood in her undergarment a couple days ago.  She was in sporadically started on Diflucan for possible yeast infection.  Today there is more blood noticed.  They were concerned that it may be coming from the vaginal region.  Nursing reports that here in the emergency room should they did an in and out catheter.  There is no blood in her vaginal area but bloody urine was noted in the catheterization. Past Medical History:  Diagnosis Date  . Arthritis    osteoporosis  . Back pain   . COPD (chronic obstructive pulmonary disease) (HCC)   . Gait instability   . Glaucoma   . Glaucoma   . Hypertension   . Neck pain   . Parkinson's disease (HCC)   . Tremor     Patient Active Problem List   Diagnosis Date Noted  . Recurrent falls 04/26/2017  . Dementia 04/26/2017  . COPD (chronic obstructive pulmonary disease) (HCC) 04/22/2017  . HTN (hypertension) 04/22/2017  . Parkinson's disease (HCC) 04/22/2017  . Glaucoma 04/22/2017  . Osteopenia 04/22/2017  . Bilateral fracture of pubic rami (HCC) 04/22/2017  . Traumatic closed nondisp fracture of greater tuberosity of humerus, left, initial encounter 12/08/2016  . Dizziness 10/13/2016    Past Surgical History:  Procedure Laterality Date  . CATARACT EXTRACTION    . EYE SURGERY    . TONSILLECTOMY    . TUBAL LIGATION       OB History    Gravida  5   Para  5   Term  5   Preterm      AB      Living         SAB      TAB      Ectopic      Multiple      Live Births               Home Medications    Prior to Admission medications   Medication Sig Start Date End Date Taking? Authorizing Provider  acetaminophen (TYLENOL) 325 MG tablet Take 650 mg by mouth every 6 (six) hours as needed.   Yes [provider]  albuterol (PROVENTIL HFA;VENTOLIN HFA) 108 (90 Base) MCG/ACT inhaler Inhale 1-2 puffs into the lungs every 6 (six) hours as needed for wheezing or shortness of breath.   Yes [provider]  aspirin EC 81 MG tablet Take 81 mg by mouth daily.   Yes [provider]  atenolol (TENORMIN) 50 MG tablet Take 25 mg by mouth daily.    Yes [provider]  budesonide-formoterol (SYMBICORT) 160-4.5 MCG/ACT inhaler Inhale 2 puffs into the lungs 2 (two) times daily.   Yes [provider]  fluconazole (DIFLUCAN) 100 MG tablet Take 100 mg by mouth daily. 08/18/17 08/24/17 Yes [provider]  LORazepam (ATIVAN) 0.5 MG tablet Take 0.5 tablets (0.25 mg total) by mouth 2 (two)  times daily as needed for anxiety. 08/18/17  Yes Lassen, Arlo C, PA-C  Melatonin 3 MG TABS Take 3 mg by mouth at bedtime.   Yes [provider]  Menthol 7.5 % PTCH Apply 1 patch topically daily.   Yes [provider]  nitroGLYCERIN (NITROSTAT) 0.4 MG SL tablet Place 1 tablet under the tongue as directed. Place 1 tablet under the tongue every five minutes as needed for emergency chest pain 11/16/12  Yes [provider]  omeprazole (PRILOSEC) 40 MG capsule Take 40 mg by mouth daily as needed (for acid reflux).    Yes [provider]  Polyvinyl Alcohol-Povidone PF 1.4-0.6 % SOLN Apply 1 drop to eye 2 (two) times daily as needed.   Yes [provider]  Probiotic Product (RISA-BID PROBIOTIC) TABS Take 1 tablet by mouth twice a day   Yes [provider]  rotigotine (NEUPRO) 6 MG/24HR Place 1 patch onto the skin daily.   Yes  [provider]  timolol (BETIMOL) 0.5 % ophthalmic solution Place 1 drop into both eyes 2 (two) times daily.   Yes [provider]  loperamide (IMODIUM A-D) 2 MG tablet Take 2 mg by mouth 4 (four) times daily as needed for diarrhea or loose stools.    [provider]    Family History Family History  Problem Relation Age of Onset  . Heart failure Mother     Social History Social History   Tobacco Use  . Smoking status: Never Smoker  . Smokeless tobacco: Never Used  Substance Use Topics  . Alcohol use: No  . Drug use: No     Allergies   Hydrochlorothiazide; Lisinopril; Other; and Spironolactone   Review of Systems Review of Systems  All other systems reviewed and are negative.    Physical Exam Updated Vital Signs BP 139/73   Pulse 67   Temp 98.7 F (37.1 C) (Rectal)   Resp (!) 21   Ht 1.473 m ( )   Wt 41.7 kg (92 lb)   SpO2 98%   BMI 19.23 kg/m   Physical Exam  Constitutional:  Non-toxic appearance. She appears ill.  Elderly, frail  HENT:  Head: Normocephalic and atraumatic.  Right Ear: External ear normal.  Left Ear: External ear normal.  Eyes: Conjunctivae are normal. Right eye exhibits no discharge. Left eye exhibits no discharge. No scleral icterus.  Neck: Neck supple. No tracheal deviation present.  Cardiovascular: Normal rate, regular rhythm and intact distal pulses.  Pulmonary/Chest: Effort normal and breath sounds normal. No stridor. No respiratory distress. She has no wheezes. She has no rales.  Abdominal: Soft. Bowel sounds are normal. She exhibits no distension. There is tenderness in the suprapubic area. There is no rigidity, no rebound and no guarding.  Musculoskeletal: She exhibits no edema or tenderness.  Neurological: She is alert. She has normal strength. No cranial nerve deficit (no facial droop, extraocular movements intact, no slurred speech) or sensory deficit. She exhibits normal muscle tone. She displays  no seizure activity. Coordination normal.  Skin: Skin is warm and dry. No rash noted.  Psychiatric: She has a normal mood and affect.  Nursing note and vitals reviewed.    ED Treatments / Results  Labs (all labs ordered are listed, but only abnormal results are displayed) Labs Reviewed  URINALYSIS, ROUTINE W REFLEX MICROSCOPIC - Abnormal; Notable for the following components:      Result Value   APPearance CLOUDY (*)    Hgb urine dipstick LARGE (*)  Protein, ur 100 (*)    Leukocytes, UA LARGE (*)    RBC / HPF >50 (*)    WBC, UA >50 (*)    Bacteria, UA RARE (*)    All other components within normal limits  CBC WITH DIFFERENTIAL/PLATELET - Abnormal; Notable for the following components:   RDW 17.2 (*)    Monocytes Absolute 1.5 (*)    All other components within normal limits  COMPREHENSIVE METABOLIC PANEL - Abnormal; Notable for the following components:   Chloride 99 (*)    Glucose, Bld 101 (*)    BUN 21 (*)    Total Protein 8.4 (*)    All other components within normal limits  LIPASE, BLOOD - Abnormal; Notable for the following components:   Lipase 80 (*)    All other components within normal limits  URINE CULTURE  PROTIME-INR    EKG None  Radiology Ct Abdomen Pelvis W Contrast  Result Date: 08/20/2017 CLINICAL DATA:  84 year old female with lower abdominal pain and burning with urination recently. EXAM: CT ABDOMEN AND PELVIS WITH CONTRAST TECHNIQUE: Multidetector CT imaging of the abdomen and pelvis was performed using the standard protocol following bolus administration of intravenous contrast. CONTRAST:  80mL ISOVUE-300 IOPAMIDOL (ISOVUE-300) INJECTION 61%, 30mL ISOVUE-300 IOPAMIDOL (ISOVUE-300) INJECTION 61% COMPARISON:  CT Abdomen and Pelvis 04/22/2017. FINDINGS: Lower chest: Stable lung bases. Chronic left lower lobe calcified granuloma, and superimposed bilateral lower lobe scarring. No pericardial or pleural effusion. Hepatobiliary: Stable liver and gallbladder.  Pancreas: Pancreas is stable and within normal limits. Spleen: Stable diminutive spleen. Adrenals/Urinary Tract: Normal adrenal glands. Bilateral renal enhancement and contrast excretion is symmetric and within normal limits. No hydronephrosis or hydroureter. The urinary bladder is mildly to moderately distended and the wall of the bladder does appear to be hyperenhancing (series 2, image 57). There is no definite perivesical inflammatory stranding. Stomach/Bowel: Stool ball in the rectum with superimposed rectal wall thickening, and mucosal hyperenhancement at the anal verge (series 2, image 69). Additional imaging through the entire perineum was obtained, and there is no associated perineum subcutaneous cellulitis or inflammation. Rectal mucosal hyperenhancement also suspected. The distal sigmoid colon is decompressed but also appears indistinct. The proximal sigmoid colon has a more normal appearance. Retained stool resumes in the descending colon. Oral contrast has reached the mid transverse colon and is mixed with stool. The cecum is moderately distended with gas and to a lesser extent contrast, similar to the January CT. Decompressed distal small bowel. No dilated small bowel loops. The stomach and duodenum are within normal limits. No abdominal free air.  No abdominal free fluid identified. Vascular/Lymphatic: Aortoiliac calcified atherosclerosis. The major arterial structures in the abdomen and pelvis are patent. The portal venous system is patent. Reproductive: Negative. Other: Small volume of pelvic free fluid and superimposed presacral soft tissue edema. Musculoskeletal: Chronic T12 compression fracture is stable. However, sclerosis of the right sacral ala is new since January compatible with a sacral insufficiency fracture. This is fairly nondisplaced. Bilateral anterior pubic rami fractures demonstrate interval callus formation, although there may be incomplete healing. IMPRESSION: 1. Perirectal and  deep pelvic edema and small volume free fluid with inflammation from the anal verge to the distal sigmoid favored to reflect Acute Proctitis. Superimposed mild to moderate rectal fecal impaction. 2. There is also hyperenhancement of the wall of the urinary bladder. Consider a secondary bladder inflammation or infection. 3. Right sacral insufficiency fracture, new since January but appears subacute. 4. Healing pubic rami fractures.  Stable T12  compression fracture. Electronically Signed   By: Odessa Fleming M.D.   On: 08/20/2017 20:41    Procedures Procedures (including critical care time)  Medications Ordered in ED Medications  sodium chloride 0.9 % bolus 500 mL (0 mLs Intravenous Stopped 08/20/17 2015)    Followed by  0.9 %  sodium chloride infusion (1,000 mLs Intravenous New Bag/Given 08/20/17 2016)  ciprofloxacin (CIPRO) IVPB 400 mg (has no administration in time range)  metroNIDAZOLE (FLAGYL) IVPB 500 mg (has no administration in time range)  morphine 4 MG/ML injection 4 mg (4 mg Intravenous Given 08/20/17 1936)  iopamidol (ISOVUE-300) 61 % injection 30 mL (30 mLs Oral Contrast Given 08/20/17 2015)  iopamidol (ISOVUE-300) 61 % injection 100 mL (80 mLs Intravenous Contrast Given 08/20/17 2004)     Initial Impression / Assessment and Plan / ED Course  I have reviewed the triage vital signs and the nursing notes.  Pertinent labs & imaging results that were available during my care of the patient were reviewed by me and considered in my medical decision making (see chart for details).   She presented to the emergency room for evaluation of abdominal pain hematuria.  Patient is ED work-up is notable for urinalysis consistent with urinary tract infection.  CT scan was also performed to evaluate for any acute abnormality considering her age and discomfort.  CT scan demonstrates an acute proctitis.  Considering the ct scan findings, her age and discomfort, will start iv abx, admit for continued  treatment.  Final Clinical Impressions(s) / ED Diagnoses   Final diagnoses:  Urinary tract infection with hematuria, site unspecified  Proctitis      Linwood Dibbles, MD 08/20/17 2116  Rectal exam performed, soft stool proximally, no fecal impaction   Linwood Dibbles, MD 08/20/17 2133

## 2017-08-20 NOTE — Progress Notes (Signed)
+ This is an acute visit.  Level care skilled.  Facility is [facility].  Chief complaint- acute visit secondary to abdominal pain with vaginal bleeding.  History of present illness.  Patient is an 84 year old female seen today for abdominal pain.  Her husband is in the room and very concerned that she is having acute abdominal discomfort.  Nursing also has noted that she  had a small amount of vaginal bleeding.  She was seen earlier this week by Dr. Chales Abrahams for suspected vaginal infection that thought to be fungal and she was started on Diflucan which she remains on.  She also has a history of hypertension Parkinson's disease with hallucinations as well as COPD a humerus fracture and low back pain because of compression fracture complicated with osteoporosis.  She also has a history of primary open angle glaucoma.--As well as suspected dementia  Was admitted to skilled nursing after a closed bilateral fracture of the pubic rami.  Patient is a poor historian and largely speaks [place].  Her husband today is very concerned that she is having acute abdominal pain.  Vital signs appear to be stable again review of systems is very difficult patient is moaning although this is difficult to ascertain exactly the severity of the discomfort.  I did do an examination of vaginal area there is a rash I could not really appreciate discharge or bleeding from the vaginal area I did test for occult bleeding from the rectum and this was negative   I do note she is on aspirin        Past Medical History:  Diagnosis Date  . Arthritis    osteoporosis  . Back pain   . COPD (chronic obstructive pulmonary disease) (HCC)   . Gait instability   . Glaucoma   . Glaucoma   . Hypertension   . Neck pain   . Parkinson's disease (HCC)   . Tremor    Past Surgical History:  Procedure Laterality Date  . CATARACT EXTRACTION    . EYE SURGERY    . TONSILLECTOMY    . TUBAL  LIGATION           Allergies  Allergen Reactions  . Hydrochlorothiazide Other (See Comments)    Reaction:  Dizziness   . Lisinopril Cough  . Other Other (See Comments)    Pt states that she is allergic to multiple BP meds -- does not recall the names.   Reaction:  Dizziness   . Spironolactone Other (See Comments)    Reaction:  Blurred vision and sweating of feet            Medication Sig  . acetaminophen (TYLENOL) 325 MG tablet Take 650 mg by mouth every 6 (six) hours as needed.  Marland Kitchen albuterol (PROVENTIL HFA;VENTOLIN HFA) 108 (90 Base) MCG/ACT inhaler Inhale 1-2 puffs into the lungs every 6 (six) hours as needed for wheezing or shortness of breath.  Marland Kitchen aspirin EC 81 MG tablet Take 81 mg by mouth daily.  Marland Kitchen atenolol (TENORMIN) 50 MG tablet Take 25 mg by mouth daily.   . budesonide-formoterol (SYMBICORT) 160-4.5 MCG/ACT inhaler Inhale 2 puffs into the lungs 2 (two) times daily.  Marland Kitchen loperamide (IMODIUM A-D) 2 MG tablet Take 2 mg by mouth 4 (four) times daily as needed for diarrhea or loose stools.  . Melatonin 3 MG TABS Take 3 mg by mouth at bedtime.  . Menthol 7.5 % PTCH Apply 1 patch topically daily.  . nitroGLYCERIN (NITROSTAT) 0.4 MG SL  tablet Place 1 tablet under the tongue as directed. Place 1 tablet under the tongue every five minutes as needed for emergency chest pain  . omeprazole (PRILOSEC) 40 MG capsule Take 40 mg by mouth daily as needed (for acid reflux).   . Polyvinyl Alcohol-Povidone PF 1.4-0.6 % SOLN Apply 1 drop to eye 2 (two) times daily as needed.  . Probiotic Product (RISA-BID PROBIOTIC) TABS Take 1 tablet by mouth twice a day  . rotigotine (NEUPRO) 6 MG/24HR Place 1 patch onto the skin daily.  . timolol (BETIMOL) 0.5 % ophthalmic solution Place 1 drop into both eyes 2 (two) times daily.  Marland Kitchen  --- Of note she has been started on Diflucan 100 mg daily x7 days with dosing to end  on Aug 24, 2017   .    .       .    Review of systems is essentially  unobtainable secondary to dementia please see HPI     Physical exam.  Temperature is 96.8 pulse is 80 respirations of 20 blood pressure taken manually 120/70.   In general this is a frail appearing elderly female who appears somewhat anxious difficult to tell level of distress appears to be calm at times and then gets a bit anxious.and appears  Somewhat distressed  Her skin is warm and dry.  Oropharynx clear mucous membranes moist.  Chest shallow air entry but could not appreciate overt congestion effort was rather poor.  Heart is regular rate and rhythm with occasional irregular beat she does not have significant lower extremity edema.  GI abdomen is soft questionable tenderness when you do press on it she does grimace and moans louder. Positive bowel sounds  GU could not really appreciate discharge at this time there is a vaginal rash-.  Rectal I did test for occult blood there is a moderate amount of stool in the rectal vault occult blood testing was negative.  Musculoskeletal does have general frailty appears to move her extremities at baseline which is very limited.  Neurologic she is alert cranial nerves appear grossly intact- she does follow some verbal commands when her husband speaks in [place].  Psych again she does appear somewhat anxious moaning.  Labs.  June 29, 2017.  Lipase was 47.  Sodium 134 potassium 3.7 BUN 19 creatinine 0.63.  Albumin was 3.1.  WBC was 10.1 hemoglobin 12.5 and platelets 360.  Assessment plan.  1.  Abdominal discomfort with some vaginal bleeding- difficult assessment secondary to patient being a poor historian complicated with language difficulties- I did have a discussion with her husband at bedside as well as with her daughter Nelva Bush via telephone-nursing also discussed this with both parties-.  At this point out of caution will send her to the ER for evaluation of abdominal discomfort with new onset of vaginal bleeding-again she  is on Diflucan for suspected fungal vaginitis.  Vital signs appear to be stable she does appear uncomfortable though somewhat unsure exactly how much this is different from her baseline.  [id number]

## 2017-08-20 NOTE — H&P (Signed)
TRH H&P    Patient Demographics:    Sandra Davis, is a 84 y.o. female  MRN: [id number]  DOB - 05-25-1928  Admit Date - 08/20/2017  Referring MD/NP/PA: Dr. Lynelle Doctor  Outpatient Primary MD for the patient is Estevan Oaks, NP  Patient coming from: Skilled facility  Chief complaint- abdominal pain   HPI:    Sandra Davis  is a 84 y.o. female, with history of dementia, COPD, hypertension, Parkinson disease, chronic bedbound status was brought to hospital from skilled facility due to lower abdominal pain and hematuria.  Patient is a resident of [facility].  Over the past few days she has had lower abdominal pain and difficulty urinating.  Patient was started on Diflucan for possible yeast infection.  Today they noticed blood in the diaper.  Which was confirmed vaginal region.  In the ED there was no blood noted in the vaginal area but bloody urine was noted in the catheterization. CT scan of the abdomen showed mild to moderate fecal impaction with signs of acute proctitis. Denies nausea vomiting No diarrhea.  Patient has been constipated Denies chest pain or shortness of breath. Complains of lower abdominal pain. No previous history of stroke or seizures. No history of CAD    Review of systems:      All other systems reviewed and are negative.   With Past History of the following :    Past Medical History:  Diagnosis Date  . Arthritis    osteoporosis  . Back pain   . COPD (chronic obstructive pulmonary disease) (HCC)   . Gait instability   . Glaucoma   . Glaucoma   . Hypertension   . Neck pain   . Parkinson's disease (HCC)   . Tremor       Past Surgical History:  Procedure Laterality Date  . CATARACT EXTRACTION    . EYE SURGERY    . TONSILLECTOMY    . TUBAL LIGATION        Social History:      Social History   Tobacco Use  . Smoking status: Never Smoker  . Smokeless  tobacco: Never Used  Substance Use Topics  . Alcohol use: No       Family History :     Family History  Problem Relation Age of Onset  . Heart failure Mother       Home Medications:   Prior to Admission medications   Medication Sig Start Date End Date Taking? Authorizing Provider  acetaminophen (TYLENOL) 325 MG tablet Take 650 mg by mouth every 6 (six) hours as needed.   Yes [provider]  albuterol (PROVENTIL HFA;VENTOLIN HFA) 108 (90 Base) MCG/ACT inhaler Inhale 1-2 puffs into the lungs every 6 (six) hours as needed for wheezing or shortness of breath.   Yes [provider]  aspirin EC 81 MG tablet Take 81 mg by mouth daily.   Yes [provider]  atenolol (TENORMIN) 50 MG tablet Take 25 mg by mouth daily.    Yes [provider]  budesonide-formoterol (  SYMBICORT) 160-4.5 MCG/ACT inhaler Inhale 2 puffs into the lungs 2 (two) times daily.   Yes [provider]  fluconazole (DIFLUCAN) 100 MG tablet Take 100 mg by mouth daily. 08/18/17 08/24/17 Yes [provider]  LORazepam (ATIVAN) 0.5 MG tablet Take 0.5 tablets (0.25 mg total) by mouth 2 (two) times daily as needed for anxiety. 08/18/17  Yes Lassen, Arlo C, PA-C  Melatonin 3 MG TABS Take 3 mg by mouth at bedtime.   Yes [provider]  Menthol 7.5 % PTCH Apply 1 patch topically daily.   Yes [provider]  nitroGLYCERIN (NITROSTAT) 0.4 MG SL tablet Place 1 tablet under the tongue as directed. Place 1 tablet under the tongue every five minutes as needed for emergency chest pain 11/16/12  Yes [provider]  omeprazole (PRILOSEC) 40 MG capsule Take 40 mg by mouth daily as needed (for acid reflux).    Yes [provider]  Polyvinyl Alcohol-Povidone PF 1.4-0.6 % SOLN Apply 1 drop to eye 2 (two) times daily as needed.   Yes [provider]  Probiotic Product (RISA-BID PROBIOTIC) TABS Take 1 tablet by mouth twice a day   Yes [provider]  rotigotine (NEUPRO) 6 MG/24HR Place 1 patch onto the skin daily.   Yes [provider]  timolol (BETIMOL) 0.5 % ophthalmic solution Place 1 drop into both eyes 2 (two) times daily.   Yes [provider]  loperamide (IMODIUM A-D) 2 MG tablet Take 2 mg by mouth 4 (four) times daily as needed for diarrhea or loose stools.    [provider]     Allergies:     Allergies  Allergen Reactions  . Hydrochlorothiazide Other (See Comments)    Reaction:  Dizziness   . Lisinopril Cough  . Other Other (See Comments)    Pt states that she is allergic to multiple BP meds -- does not recall the names.   Reaction:  Dizziness   . Spironolactone Other (See Comments)    Reaction:  Blurred vision and sweating of feet      Physical Exam:   Vitals  Blood pressure (!) 131/107, pulse 72, temperature 98.7 F (37.1 C), temperature source Rectal, resp. rate (!) 27, height  (1.473 m), weight 41.7 kg (92 lb), SpO2 100 %.  1.  General: Appears in no acute distress  2. Psychiatric:  Intact judgement and  insight, awake alert, oriented x 3.  3. Neurologic: No focal neurological deficits, all cranial nerves intact.Strength 5/5 all 4 extremities, sensation intact all 4 extremities, plantars down going.  Positive tremors noted in the hands  4. Eyes :  anicteric sclerae, moist conjunctivae with no lid lag. PERRLA.  5. ENMT:  Oropharynx clear with moist mucous membranes and good dentition  6. Neck:  supple, no cervical lymphadenopathy appriciated, No thyromegaly  7. Respiratory : Normal respiratory effort, good air movement bilaterally,clear to  auscultation bilaterally  8. Cardiovascular : RRR, no gallops, rubs or murmurs, no leg edema  9. Gastrointestinal:  Positive bowel sounds, abdomen soft, tender to palpation in suprapubic region,no hepatosplenomegaly, no rigidity or guarding       10. Skin:  No cyanosis, normal texture and turgor, no rash, lesions or  ulcers  11.Musculoskeletal:  Good muscle tone,  joints appear normal , no effusions,  normal range of motion    Data Review:    CBC Recent Labs  Lab 08/20/17 1807  WBC 10.4  HGB 13.4  HCT 41.9  PLT  325  MCV 90.1  MCH 28.8  MCHC 32.0  RDW 17.2*  LYMPHSABS 1.9  MONOABS 1.5*  EOSABS 0.6  BASOSABS 0.1   ------------------------------------------------------------------------------------------------------------------  Chemistries  Recent Labs  Lab 08/20/17 1807  NA 135  K 3.7  CL 99*  CO2 29  GLUCOSE 101*  BUN 21*  CREATININE 0.82  CALCIUM 9.3  AST 27  ALT 16  ALKPHOS 89  BILITOT 1.0   ------------------------------------------------------------------------------------------------------------------  ------------------------------------------------------------------------------------------------------------------ GFR: Estimated Creatinine Clearance: 31.8 mL/min (by C-G formula based on SCr of 0.82 mg/dL). Liver Function Tests: Recent Labs  Lab 08/20/17 1807  AST 27  ALT 16  ALKPHOS 89  BILITOT 1.0  PROT 8.4*  ALBUMIN 3.5   Recent Labs  Lab 08/20/17 1909  LIPASE 80*   No results for input(s): AMMONIA in the last 168 hours. Coagulation Profile: Recent Labs  Lab 08/20/17 1909  INR 0.89       Imaging Results:    Ct Abdomen Pelvis W Contrast  Result Date: 08/20/2017 CLINICAL DATA:  84 year old female with lower abdominal pain and burning with urination recently. EXAM: CT ABDOMEN AND PELVIS WITH CONTRAST TECHNIQUE: Multidetector CT imaging of the abdomen and pelvis was performed using the standard protocol following bolus administration of intravenous contrast. CONTRAST:  80mL ISOVUE-300 IOPAMIDOL (ISOVUE-300) INJECTION 61%, 30mL ISOVUE-300 IOPAMIDOL (ISOVUE-300) INJECTION 61% COMPARISON:  CT Abdomen and Pelvis 04/22/2017. FINDINGS: Lower chest: Stable lung bases. Chronic left lower lobe calcified granuloma, and superimposed bilateral lower lobe  scarring. No pericardial or pleural effusion. Hepatobiliary: Stable liver and gallbladder. Pancreas: Pancreas is stable and within normal limits. Spleen: Stable diminutive spleen. Adrenals/Urinary Tract: Normal adrenal glands. Bilateral renal enhancement and contrast excretion is symmetric and within normal limits. No hydronephrosis or hydroureter. The urinary bladder is mildly to moderately distended and the wall of the bladder does appear to be hyperenhancing (series 2, image 57). There is no definite perivesical inflammatory stranding. Stomach/Bowel: Stool ball in the rectum with superimposed rectal wall thickening, and mucosal hyperenhancement at the anal verge (series 2, image 69). Additional imaging through the entire perineum was obtained, and there is no associated perineum subcutaneous cellulitis or inflammation. Rectal mucosal hyperenhancement also suspected. The distal sigmoid colon is decompressed but also appears indistinct. The proximal sigmoid colon has a more normal appearance. Retained stool resumes in the descending colon. Oral contrast has reached the mid transverse colon and is mixed with stool. The cecum is moderately distended with gas and to a lesser extent contrast, similar to the January CT. Decompressed distal small bowel. No dilated small bowel loops. The stomach and duodenum are within normal limits. No abdominal free air.  No abdominal free fluid identified. Vascular/Lymphatic: Aortoiliac calcified atherosclerosis. The major arterial structures in the abdomen and pelvis are patent. The portal venous system is patent. Reproductive: Negative. Other: Small volume of pelvic free fluid and superimposed presacral soft tissue edema. Musculoskeletal: Chronic T12 compression fracture is stable. However, sclerosis of the right sacral ala is new since January compatible with a sacral insufficiency fracture. This is fairly nondisplaced. Bilateral anterior pubic rami fractures demonstrate interval  callus formation, although there may be incomplete healing. IMPRESSION: 1. Perirectal and deep pelvic edema and small volume free fluid with inflammation from the anal verge to the distal sigmoid favored to reflect Acute Proctitis. Superimposed mild to moderate rectal fecal impaction. 2. There is also hyperenhancement of the wall of the urinary bladder. Consider a secondary bladder inflammation or infection. 3. Right sacral insufficiency fracture, new since January but  appears subacute. 4. Healing pubic rami fractures.  Stable T12 compression fracture. Electronically Signed   By: Odessa Fleming M.D.   On: 08/20/2017 20:41      Assessment & Plan:    Active Problems:   Parkinson's disease (HCC)   Dementia   UTI (urinary tract infection)   Proctitis   1. UTI-patient presenting with abnormal UA with significant hematuria, started on IV ciprofloxacin.  Follow urine culture results. 2. Fecal impaction/acute proctitis-CT scan shows mild to moderate fecal impaction, disimpaction tried in the ED with no result.  I called and discussed with GI on-call Dr. Jena Gauss, he recommends starting MiraLAX and he will see patient in a.m.  Patient might need milk of molasses enema if MiraLAX does not help.  Also patient started on IV Flagyl in addition to ciprofloxacin due to acute proctitis. 3. Hypertension-continue atenolol 4. Anxiety-continue lorazepam as needed 5. Parkinson disease-continue Neupro transdermal.   DVT Prophylaxis-   SCDs   AM Labs Ordered, also please review Full Orders  Family Communication: Admission, patients condition and plan of care including tests being ordered have been discussed with the patient and her son at bedside who indicate understanding and agree with the plan and Code Status.  Code Status: DNR  Admission status: Inpatient  Time spent in minutes : 60 minutes   Meredeth Ide M.D on 08/20/2017 at 10:03 PM  Between 7am to 7pm - Pager - [phone]. After 7pm go to [email or web address] -  password [facility]  [facility] - Office  [phone]

## 2017-08-20 NOTE — ED Notes (Signed)
Patient transported to CT 

## 2017-08-20 NOTE — ED Triage Notes (Addendum)
Using the interpreter pt c/o lower abdominal pain and difficulty urinating with burning over the past few days. [facility] reports negative UA today. [facility] also reports gradual decline over the past few months and generalized weakness. PT denies any n/v/d. PT was started on Diflucan on 08/18/17 for thought vaginal yeast infection due to blood noted in incontinence pull-up.

## 2017-08-20 NOTE — ED Notes (Signed)
ED Provider at bedside. 

## 2017-08-20 NOTE — ED Notes (Signed)
Pt c/o difficulty breathing. Pt placed on 2L [place].  O2 Sats maintaining 100%

## 2017-08-20 NOTE — ED Notes (Signed)
Bladder Scan Performed. 189 ml residual urine measured.

## 2017-08-20 NOTE — ED Notes (Signed)
Pt returned from CT Scan 

## 2017-08-21 DIAGNOSIS — K5641 Fecal impaction: Secondary | ICD-10-CM

## 2017-08-21 DIAGNOSIS — G20 Parkinson's disease: Secondary | ICD-10-CM

## 2017-08-21 DIAGNOSIS — F0281 Dementia in other diseases classified elsewhere with behavioral disturbance: Secondary | ICD-10-CM

## 2017-08-21 DIAGNOSIS — N390 Urinary tract infection, site not specified: Principal | ICD-10-CM

## 2017-08-21 DIAGNOSIS — R319 Hematuria, unspecified: Secondary | ICD-10-CM

## 2017-08-21 LAB — CBC
HCT: 35.9 % — ABNORMAL LOW (ref 36.0–46.0)
Hemoglobin: 11.4 g/dL — ABNORMAL LOW (ref 12.0–15.0)
MCH: 28.7 pg (ref 26.0–34.0)
MCHC: 31.8 g/dL (ref 30.0–36.0)
MCV: 90.4 fL (ref 78.0–100.0)
PLATELETS: 299 10*3/uL (ref 150–400)
RBC: 3.97 MIL/uL (ref 3.87–5.11)
RDW: 17.3 % — AB (ref 11.5–15.5)
WBC: 8.3 10*3/uL (ref 4.0–10.5)

## 2017-08-21 LAB — COMPREHENSIVE METABOLIC PANEL
ALT: 13 U/L — ABNORMAL LOW (ref 14–54)
ANION GAP: 6 (ref 5–15)
AST: 21 U/L (ref 15–41)
Albumin: 2.7 g/dL — ABNORMAL LOW (ref 3.5–5.0)
Alkaline Phosphatase: 67 U/L (ref 38–126)
BUN: 14 mg/dL (ref 6–20)
CHLORIDE: 107 mmol/L (ref 101–111)
CO2: 26 mmol/L (ref 22–32)
Calcium: 8.5 mg/dL — ABNORMAL LOW (ref 8.9–10.3)
Creatinine, Ser: 0.62 mg/dL (ref 0.44–1.00)
Glucose, Bld: 91 mg/dL (ref 65–99)
POTASSIUM: 4 mmol/L (ref 3.5–5.1)
Sodium: 139 mmol/L (ref 135–145)
TOTAL PROTEIN: 6.6 g/dL (ref 6.5–8.1)
Total Bilirubin: 0.8 mg/dL (ref 0.3–1.2)

## 2017-08-21 LAB — MRSA PCR SCREENING: MRSA BY PCR: NEGATIVE

## 2017-08-21 MED ORDER — ENOXAPARIN SODIUM 30 MG/0.3ML ~~LOC~~ SOLN
30.0000 mg | SUBCUTANEOUS | Status: DC
  Administered 2017-08-21 – 2017-08-23 (×3): 30 mg via SUBCUTANEOUS
  Filled 2017-08-21 (×3): qty 0.3

## 2017-08-21 MED ORDER — MILK AND MOLASSES ENEMA
1.0000 | Freq: Two times a day (BID) | RECTAL | Status: AC
  Administered 2017-08-21 (×2): 250 mL via RECTAL

## 2017-08-21 MED ORDER — ENSURE ENLIVE PO LIQD
237.0000 mL | Freq: Three times a day (TID) | ORAL | Status: DC
  Administered 2017-08-22 – 2017-08-24 (×5): 237 mL via ORAL

## 2017-08-21 MED ORDER — DOCUSATE SODIUM 100 MG PO CAPS
100.0000 mg | ORAL_CAPSULE | Freq: Two times a day (BID) | ORAL | Status: DC
  Administered 2017-08-21 – 2017-08-24 (×6): 100 mg via ORAL
  Filled 2017-08-21 (×6): qty 1

## 2017-08-21 NOTE — Consult Note (Signed)
 @   Primary Care Physician:  Estevan Oaks, NP Primary Gastroenterologist:  Dr. Jena Gauss  Pre-Procedure History & Physical: HPI:  Sandra Davis is a 84 y.o. female skilled facility resident admitted through the ED last night with urinary tract infection and gross hematuria. CT scan revealed a large stool bolus in the rectum with some associated edema of the rectum and distal sigmoid.  No report of hematochezia or diarrhea at the nursing home.She has been started on Cipro and Flagyl. No information available regarding any prior GI evaluation, colonoscopy, etc. History of GERD for which he takes omeprazole daily.  I personally reviewed CT images.  She has been given 2 doses of MiraLAX since admission without any bowel movement overnight according to nursing staff. No enemas as of yet.    Past Medical History:  Diagnosis Date  . Arthritis    osteoporosis  . Back pain   . COPD (chronic obstructive pulmonary disease) (HCC)   . Dementia   . Gait instability   . Glaucoma   . Glaucoma   . Hypertension   . Neck pain   . Parkinson's disease (HCC)   . Tremor     Past Surgical History:  Procedure Laterality Date  . CATARACT EXTRACTION    . EYE SURGERY    . TONSILLECTOMY    . TUBAL LIGATION      Prior to Admission medications   Medication Sig Start Date End Date Taking? Authorizing Provider  acetaminophen (TYLENOL) 325 MG tablet Take 650 mg by mouth every 6 (six) hours as needed.   Yes [provider]  albuterol (PROVENTIL HFA;VENTOLIN HFA) 108 (90 Base) MCG/ACT inhaler Inhale 1-2 puffs into the lungs every 6 (six) hours as needed for wheezing or shortness of breath.   Yes [provider]  aspirin EC 81 MG tablet Take 81 mg by mouth daily.   Yes [provider]  atenolol (TENORMIN) 50 MG tablet Take 25 mg by mouth daily.    Yes [provider]  budesonide-formoterol (SYMBICORT) 160-4.5 MCG/ACT inhaler Inhale 2 puffs into the lungs 2 (two)  times daily.   Yes [provider]  fluconazole (DIFLUCAN) 100 MG tablet Take 100 mg by mouth daily. 08/18/17 08/24/17 Yes [provider]  LORazepam (ATIVAN) 0.5 MG tablet Take 0.5 tablets (0.25 mg total) by mouth 2 (two) times daily as needed for anxiety. 08/18/17  Yes Lassen, Arlo C, PA-C  Melatonin 3 MG TABS Take 3 mg by mouth at bedtime.   Yes [provider]  Menthol 7.5 % PTCH Apply 1 patch topically daily.   Yes [provider]  nitroGLYCERIN (NITROSTAT) 0.4 MG SL tablet Place 1 tablet under the tongue as directed. Place 1 tablet under the tongue every five minutes as needed for emergency chest pain 11/16/12  Yes [provider]  omeprazole (PRILOSEC) 40 MG capsule Take 40 mg by mouth daily as needed (for acid reflux).    Yes [provider]  Polyvinyl Alcohol-Povidone PF 1.4-0.6 % SOLN Apply 1 drop to eye 2 (two) times daily as needed.   Yes [provider]  Probiotic Product (RISA-BID PROBIOTIC) TABS Take 1 tablet by mouth twice a day   Yes [provider]  rotigotine (NEUPRO) 6 MG/24HR Place 1 patch onto the skin daily.   Yes [provider]  timolol (BETIMOL) 0.5 % ophthalmic solution Place 1 drop into both eyes 2 (two) times daily.   Yes [provider]  loperamide (IMODIUM A-D) 2 MG  tablet Take 2 mg by mouth 4 (four) times daily as needed for diarrhea or loose stools.    [provider]    Allergies as of 08/20/2017 - Review Complete 08/20/2017  Allergen Reaction Noted  . Hydrochlorothiazide Other (See Comments) 03/13/2016  . Lisinopril Cough 03/13/2016  . Other Other (See Comments) 08/18/2014  . Spironolactone Other (See Comments) 02/12/2015    Family History  Problem Relation Age of Onset  . Heart failure Mother     Social History   Socioeconomic History  . Marital status: Married    Spouse name: Normand Sloop  . Number of children: 5  . Years of education: 3  . Highest education  level: Not on file  Occupational History  . Not on file  Social Needs  . Financial resource strain: Not on file  . Food insecurity:    Worry: Not on file    Inability: Not on file  . Transportation needs:    Medical: Not on file    Non-medical: Not on file  Tobacco Use  . Smoking status: Never Smoker  . Smokeless tobacco: Never Used  Substance and Sexual Activity  . Alcohol use: No  . Drug use: No  . Sexual activity: Not on file  Lifestyle  . Physical activity:    Days per week: Not on file    Minutes per session: Not on file  . Stress: Not on file  Relationships  . Social connections:    Talks on phone: Not on file    Gets together: Not on file    Attends religious service: Not on file    Active member of club or organization: Not on file    Attends meetings of clubs or organizations: Not on file    Relationship status: Not on file  . Intimate partner violence:    Fear of current or ex partner: Not on file    Emotionally abused: Not on file    Physically abused: Not on file    Forced sexual activity: Not on file  Other Topics Concern  . Not on file  Social History Narrative   Lives at home with husband, family   Caffeine use- tea 2 cups daily, decaf green tea     Review of Systems: See HPI, otherwise negative ROS  Physical Exam: BP 128/69 (BP Location: Left Arm)   Pulse 63   Temp 98.3 F (36.8 C) (Oral)   Resp 16   Ht  (1.473 m)   Wt 92 lb 9.5 oz (42 kg)   SpO2 94%   BMI 19.35 kg/m  General:    pleasant and cooperative in NAD Abdomen: Non-distended, normal bowel sounds.  Soft and nontender without appreciable mass or hepatosplenomegaly.  Pulses:  Normal pulses noted. Extremities:  Without clubbing or edema. Rectal: Moderately good sphincter tone. Large amount of soft brown stool in the rectal vault. No obvious mass. No blood grossly.    Impression:  84 year old lady resident of nursing home admitted with abdominal pain and gross  hematuria-urinary tract infection. Large stool bolus in the rectum on CT with associated rectal and distal sigmoid edema. I suspect bowel/rectal wall thickening is more related to stool burden and bowel distention in this segment then an infectious process or inflammatory bowel disease. Abdominal pain could easily be related to urinary tract infection and/or constipation.  Recommendations:   In addition to twice a day MiraLAX, we will provide 2 milk and molasses enemas today. We'll also add Colace 100 mg  twice a day to her regimen.  Antibiotics for CT abnormality may not be needed.  We'll reassess tomorrow morning.  Notice: This dictation was prepared with Dragon dictation along with smaller phrase technology. Any transcriptional errors that result from this process are unintentional and may not be corrected upon review.

## 2017-08-21 NOTE — Progress Notes (Signed)
PROGRESS NOTE    Sandra Davis  HKV:[id number] DOB: 07/27/1928 DOA: 08/20/2017 PCP: Estevan Oaks, NP    Brief Narrative:  84 year old female with a history of dementia and Parkinson's disease, admitted to the hospital with abdominal pain.  Found to have moderate fecal impaction as well as possible urinary tract infection.  Started on intravenous antibiotics.  GI consulted she is currently on a bowel regimen.  She has not had any bowel movements as of yet.   Assessment & Plan:   Active Problems:   Parkinson's disease (HCC)   Dementia   UTI (urinary tract infection)   Proctitis   1. Possible urinary tract infection.  On intravenous ciprofloxacin.  Follow-up urine culture. 2. Fecal impaction/proctitis.  GI input appreciated.  Currently on MiraLAX as well as milk of molasses enema.  Continue to monitor for bowel movements. 3. Hypertension.  On atenolol.  Blood pressure stable 4. Anxiety.  Continue as needed lorazepam 5. Parkinson's disease.  Continue on Neupro transdermal   DVT prophylaxis: Lovenox Code Status: DNR Family Communication: Discussed with daughter at the bedside Disposition Plan: Return to nursing facility on discharge   Consultants:   Gastroenterology  Procedures:     Antimicrobials:   Cipro 5/10 >  Flagyl 5/10 >   Subjective: Complains of pain in rectal area.  Daughter has noticed decreased p.o. intake.  Objective: Vitals:   08/20/17 2336 08/21/17 0545 08/21/17 0936 08/21/17 1401  BP: (!) 162/65 128/69  (!) 155/78  Pulse: 66 63  68  Resp: Temp: 97.8 F (36.6 C) 98.3 F (36.8 C)  98.5 F (36.9 C)  TempSrc: Oral Oral  Oral  SpO2: 95% 100% 94% 97%  Weight: 42 kg (92 lb 9.5 oz)     Height:  (1.473 m)       Intake/Output Summary (Last 24 hours) at 08/21/2017 1715 Last data filed at 08/21/2017 1000 Gross per 24 hour  Intake 2092.91 ml  Output -  Net 2092.91 ml   Filed Weights   08/20/17 1747 08/20/17 2336  Weight:  41.7 kg (92 lb) 42 kg (92 lb 9.5 oz)    Examination:  General exam: Appears calm and comfortable  Respiratory system: Clear to auscultation. Respiratory effort normal. Cardiovascular system: S1 & S2 heard, RRR. No JVD, murmurs, rubs, gallops or clicks. No pedal edema. Gastrointestinal system: Abdomen is nondistended, soft and nontender. No organomegaly or masses felt. Normal bowel sounds heard. Central nervous system: Alert and oriented. No focal neurological deficits. Extremities: Symmetric 5 x 5 power. Skin: No rashes, lesions or ulcers Psychiatry: Confused    Data Reviewed: I have personally reviewed following labs and imaging studies  CBC: Recent Labs  Lab 08/20/17 1807 08/21/17 0553  WBC 10.4 8.3  NEUTROABS 6.4  --   HGB 13.4 11.4*  HCT 41.9 35.9*  MCV 90.1 90.4  PLT 325 299   Basic Metabolic Panel: Recent Labs  Lab 08/20/17 1807 08/21/17 0553  NA 135 139  K 3.7 4.0  CL 99* 107  CO2 29 26  GLUCOSE 101* 91  BUN 21* 14  CREATININE 0.82 0.62  CALCIUM 9.3 8.5*   GFR: Estimated Creatinine Clearance: 32.6 mL/min (by C-G formula based on SCr of 0.62 mg/dL). Liver Function Tests: Recent Labs  Lab 08/20/17 1807 08/21/17 0553  AST 27 21  ALT 16 13*  ALKPHOS 89 67  BILITOT 1.0 0.8  PROT 8.4* 6.6  ALBUMIN 3.5 2.7*   Recent Labs  Lab 08/20/17  1909  LIPASE 80*   No results for input(s): AMMONIA in the last 168 hours. Coagulation Profile: Recent Labs  Lab 08/20/17 1909  INR 0.89   Cardiac Enzymes: No results for input(s): CKTOTAL, CKMB, CKMBINDEX, TROPONINI in the last 168 hours. BNP (last 3 results) No results for input(s): PROBNP in the last 8760 hours. HbA1C: No results for input(s): HGBA1C in the last 72 hours. CBG: No results for input(s): GLUCAP in the last 168 hours. Lipid Profile: No results for input(s): CHOL, HDL, LDLCALC, TRIG, CHOLHDL, LDLDIRECT in the last 72 hours. Thyroid Function Tests: No results for input(s): TSH, T4TOTAL,  FREET4, T3FREE, THYROIDAB in the last 72 hours. Anemia Panel: No results for input(s): VITAMINB12, FOLATE, FERRITIN, TIBC, IRON, RETICCTPCT in the last 72 hours. Sepsis Labs: No results for input(s): PROCALCITON, LATICACIDVEN in the last 168 hours.  Recent Results (from the past 240 hour(s))  Culture, Urine     Status: Abnormal   Collection Time: 08/12/17  9:50 PM  Result Value Ref Range Status   Specimen Description   Final    URINE, RANDOM Performed at [facility], [address], [place], [place] [address]    Special Requests   Final    NONE Performed at [facility], [address], [place], [place] [address]    Culture MULTIPLE SPECIES PRESENT, SUGGEST RECOLLECTION (A)  Final   Report Status 08/14/2017 FINAL  Final  Culture, Urine     Status: Abnormal   Collection Time: 08/14/17  3:15 PM  Result Value Ref Range Status   Specimen Description   Final    URINE, CATHETERIZED Performed at [facility], [address] [place], [place] [address]    Special Requests   Final    NONE Performed at [facility], [address], [place], [place] [address]    Culture MULTIPLE SPECIES PRESENT, SUGGEST RECOLLECTION (A)  Final   Report Status 08/16/2017 FINAL  Final  MRSA PCR Screening     Status: None   Collection Time: 08/21/17 12:59 AM  Result Value Ref Range Status   MRSA by PCR NEGATIVE NEGATIVE Final    Comment:        The GeneXpert MRSA Assay (FDA approved for NASAL specimens only), is one component of a comprehensive MRSA colonization surveillance program. It is not intended to diagnose MRSA infection nor to guide or monitor treatment for MRSA infections. Performed at [facility], [address], [place], [place] [address]          Radiology Studies: Ct Abdomen Pelvis W Contrast  Result Date: 08/20/2017 CLINICAL DATA:  84 year old female with lower abdominal pain and burning with urination recently. EXAM: CT ABDOMEN AND PELVIS WITH CONTRAST TECHNIQUE: Multidetector  CT imaging of the abdomen and pelvis was performed using the standard protocol following bolus administration of intravenous contrast. CONTRAST:  80mL ISOVUE-300 IOPAMIDOL (ISOVUE-300) INJECTION 61%, 30mL ISOVUE-300 IOPAMIDOL (ISOVUE-300) INJECTION 61% COMPARISON:  CT Abdomen and Pelvis 04/22/2017. FINDINGS: Lower chest: Stable lung bases. Chronic left lower lobe calcified granuloma, and superimposed bilateral lower lobe scarring. No pericardial or pleural effusion. Hepatobiliary: Stable liver and gallbladder. Pancreas: Pancreas is stable and within normal limits. Spleen: Stable diminutive spleen. Adrenals/Urinary Tract: Normal adrenal glands. Bilateral renal enhancement and contrast excretion is symmetric and within normal limits. No hydronephrosis or hydroureter. The urinary bladder is mildly to moderately distended and the wall of the bladder does appear to be hyperenhancing (series 2, image 57). There is no definite perivesical inflammatory stranding. Stomach/Bowel: Stool ball in  the rectum with superimposed rectal wall thickening, and mucosal hyperenhancement at the anal verge (series 2, image 69). Additional imaging through the entire perineum was obtained, and there is no associated perineum subcutaneous cellulitis or inflammation. Rectal mucosal hyperenhancement also suspected. The distal sigmoid colon is decompressed but also appears indistinct. The proximal sigmoid colon has a more normal appearance. Retained stool resumes in the descending colon. Oral contrast has reached the mid transverse colon and is mixed with stool. The cecum is moderately distended with gas and to a lesser extent contrast, similar to the January CT. Decompressed distal small bowel. No dilated small bowel loops. The stomach and duodenum are within normal limits. No abdominal free air.  No abdominal free fluid identified. Vascular/Lymphatic: Aortoiliac calcified atherosclerosis. The major arterial structures in the abdomen and pelvis  are patent. The portal venous system is patent. Reproductive: Negative. Other: Small volume of pelvic free fluid and superimposed presacral soft tissue edema. Musculoskeletal: Chronic T12 compression fracture is stable. However, sclerosis of the right sacral ala is new since January compatible with a sacral insufficiency fracture. This is fairly nondisplaced. Bilateral anterior pubic rami fractures demonstrate interval callus formation, although there may be incomplete healing. IMPRESSION: 1. Perirectal and deep pelvic edema and small volume free fluid with inflammation from the anal verge to the distal sigmoid favored to reflect Acute Proctitis. Superimposed mild to moderate rectal fecal impaction. 2. There is also hyperenhancement of the wall of the urinary bladder. Consider a secondary bladder inflammation or infection. 3. Right sacral insufficiency fracture, new since January but appears subacute. 4. Healing pubic rami fractures.  Stable T12 compression fracture. Electronically Signed   By: Odessa Fleming M.D.   On: 08/20/2017 20:41        Scheduled Meds: . atenolol  25 mg Oral Daily  . docusate sodium  100 mg Oral BID  . feeding supplement  1 Container Oral TID BM  . [START ON 08/22/2017] feeding supplement (ENSURE ENLIVE)  237 mL Oral TID BM  . milk and molasses  1 enema Rectal BID  . mometasone-formoterol  2 puff Inhalation BID  . polyethylene glycol  17 g Oral BID  . rotigotine  1 patch Transdermal Daily  . timolol  1 drop Both Eyes BID   Continuous Infusions: . sodium chloride Stopped (08/21/17 0053)  . sodium chloride 50 mL/hr at 08/21/17 0053  . ciprofloxacin Stopped (08/21/17 0939)  . metronidazole Stopped (08/21/17 1526)     LOS: 1 day    Time spent:    Erick Blinks, MD [facility] Pager [phone]  If 7PM-7AM, please contact night-coverage [email or web address] Password [email or web address] 08/21/2017, 5:15 PM

## 2017-08-21 NOTE — Progress Notes (Signed)
Initial Nutrition Assessment  DOCUMENTATION CODES:  Not applicable  INTERVENTION:  While restricted to Clears, Boost Breeze po TID, each supplement provides 250 kcal and 9 grams of protein  Once advanced to Full liquids, Ensure Enlive po TID, each supplement provides 350 kcal and 20 grams of protein  NUTRITION DIAGNOSIS:  Inadequate oral intake related to chronic illness(Dementia, anxiety) as evidenced by loss of 9% bw x 3 months  GOAL:  Patient will meet greater than or equal to 90% of their needs  MONITOR:  PO intake, Supplement acceptance, Diet advancement, Labs, Weight trends, I & O's  REASON FOR ASSESSMENT:  Malnutrition Screening Tool    ASSESSMENT:  84 y/o female PMHx Dementia, Parkinsons, COPD, recurrent falls w/ multiple fxs. Presents from SNF with dysuria and lower abdominal pain for past few days. Had episode of hematuria which prompted ED eval. Work up showed UTI, moderate fecal impaction and signs of acute proctitis. Admitted for management .   RD operating remotely on weekends, however, RD is quite familiar with patient as she is a resident at [facility]. She has been a resident of the facility since Jan 11 after she fell and suffered bilateral superior/inferior pubic rami fxs, managed conservatively. She has become a long term resident.   At [facility], the patient's intake is chronically poor, thought to be related to dementia and severe anxiety. She most often eats </= to 50% of her meals. She receives much of her nutrition from supplements-she has ensure enlive ordered TID and most often consumes 50-100% of these.   Weight wise, the patient has lost a significant amount of weight since she was admitted to the [facility]. She was admitted to SNF at 96.8 lbs, though her spouse had noted the patients UBW had been closer to 90 lbs. Last weight at SNF on 5/6 was 83.2 lbs. She has lost 9 lbs (9.6%) in last 3 months  At this time, the patient is on a CL diet. Will order  Boost Breeze TID for now and switch back to Ensure Enlive once her diet is advanced to full liquids.  Labs: H/H:11.4//35.9, Albumin:2.7 Meds: Resource breeze, [place],   Recent Labs  Lab 08/20/17 1807 08/21/17 0553  NA 135 139  K 3.7 4.0  CL 99* 107  CO2 29 26  BUN 21* 14  CREATININE 0.82 0.62  CALCIUM 9.3 8.5*  GLUCOSE 101* 91   NUTRITION - FOCUSED PHYSICAL EXAM: Unable to assess  Diet Order:   Diet Order           Diet clear liquid Room service appropriate? Yes; Fluid consistency: Thin  Diet effective now         EDUCATION NEEDS:  No education needs have been identified at this time  Skin:     Last BM:  Unknown  Height:  Ht Readings from Last 1 Encounters:  08/20/17  (1.473 m)   Weight:  Wt Readings from Last 1 Encounters:  08/20/17 92 lb 9.5 oz (42 kg)   Wt Readings from Last 10 Encounters:  08/20/17 92 lb 9.5 oz (42 kg)  08/05/17 94 lb (42.6 kg)  06/29/17 94 lb (42.6 kg)  04/22/17 97 lb (44 kg)  02/28/17 95 lb (43.1 kg)  05/13/16 94 lb (42.6 kg)  03/13/16 90 lb (40.8 kg)  08/21/15 93 lb (42.2 kg)  07/18/15 97 lb (44 kg)  03/19/15 97 lb (44 kg)   Ideal Body Weight:  43.9 kg  BMI:  Body mass index is  19.35 kg/m.  Estimated Nutritional Needs:  Kcal:  1250-1450 (30-34 kcal/kg bw) Protein:  60-70g Pro (1.4-1.6) Fluid:  1.3-1.5 (1 ml/kg)   Christophe Louis RD, LDN, CNSC Clinical Nutrition Available Tues-Sat via Pager: [phone] 08/21/2017 9:12 AM

## 2017-08-22 MED ORDER — POLYETHYLENE GLYCOL 3350 17 G PO PACK
17.0000 g | PACK | Freq: Four times a day (QID) | ORAL | Status: DC
  Administered 2017-08-22 – 2017-08-24 (×6): 17 g via ORAL
  Filled 2017-08-22 (×7): qty 1

## 2017-08-22 NOTE — Progress Notes (Signed)
Patient had enema per md order and oral laxative per md order. Patient did not tolerate enema well, and no results were had from enema.  Patient drank minimal amount of po liquid.  Patient did not appear to suffer abdominal discomfort and or having episodes of nausea or vomiting during shift.

## 2017-08-22 NOTE — Progress Notes (Signed)
PROGRESS NOTE    Sandra Davis  ZOX:[id number] DOB: Jul 02, 1928 DOA: 08/20/2017 PCP: Estevan Oaks, NP    Brief Narrative:  84 year old female with a history of dementia and Parkinson's disease, admitted to the hospital with abdominal pain.  Found to have moderate fecal impaction as well as possible urinary tract infection.  Started on intravenous antibiotics.  GI consulted she is currently on a bowel regimen.  She has not had any bowel movements as of yet.   Assessment & Plan:   Active Problems:   Parkinson's disease (HCC)   Dementia   UTI (urinary tract infection)   Proctitis   1. Urinary tract infection.  Urine culture positive for Proteus.  On intravenous ciprofloxacin.  Follow-up culture sensitivities. 2. Fecal impaction/proctitis.  GI input appreciated.  She was treated with MiraLAX as well as milk and molasses enemas.  She did not have any significant bowel movement with enemas.  MiraLAX dose has been increased. 3. Hypertension.  On atenolol.  Blood pressure stable  4. Anxiety.  Continue as needed lorazepam 5. Parkinson's disease.  Continue on Neupro transdermal   DVT prophylaxis: Lovenox Code Status: DNR Family Communication: Discussed with daughter at the bedside Disposition Plan: Return to nursing facility on discharge   Consultants:   Gastroenterology  Procedures:     Antimicrobials:   Cipro 5/10 >  Flagyl 5/10 >   Subjective: Confused.  Did not have a good response to enemas yesterday.  Objective: Vitals:   08/21/17 2036 08/22/17 0600 08/22/17 0753 08/22/17 1423  BP: (!) 152/77 138/90  114/63  Pulse: 77 76  65  Resp: Temp: 98.7 F (37.1 C) 97.6 F (36.4 C)  97.7 F (36.5 C)  TempSrc: Oral Oral  Oral  SpO2: 97% 96% 96% 97%  Weight:      Height:        Intake/Output Summary (Last 24 hours) at 08/22/2017 1714 Last data filed at 08/22/2017 1400 Gross per 24 hour  Intake 2440 ml  Output 1400 ml  Net 1040 ml   Filed Weights     08/20/17 1747 08/20/17 2336  Weight: 41.7 kg (92 lb) 42 kg (92 lb 9.5 oz)    Examination:  General exam: Alert, awake, oriented x 3 Respiratory system: Clear to auscultation. Respiratory effort normal. Cardiovascular system:RRR. No murmurs, rubs, gallops. Gastrointestinal system: Abdomen is nondistended, soft and tender in lower abdomen. No organomegaly or masses felt. Normal bowel sounds heard. Central nervous system: Alert and oriented. No focal neurological deficits. Extremities: No C/C/E, +pedal pulses Skin: No rashes, lesions or ulcers Psychiatry: Confused     Data Reviewed: I have personally reviewed following labs and imaging studies  CBC: Recent Labs  Lab 08/20/17 1807 08/21/17 0553  WBC 10.4 8.3  NEUTROABS 6.4  --   HGB 13.4 11.4*  HCT 41.9 35.9*  MCV 90.1 90.4  PLT 325 299   Basic Metabolic Panel: Recent Labs  Lab 08/20/17 1807 08/21/17 0553  NA 135 139  K 3.7 4.0  CL 99* 107  CO2 29 26  GLUCOSE 101* 91  BUN 21* 14  CREATININE 0.82 0.62  CALCIUM 9.3 8.5*   GFR: Estimated Creatinine Clearance: 32.6 mL/min (by C-G formula based on SCr of 0.62 mg/dL). Liver Function Tests: Recent Labs  Lab 08/20/17 1807 08/21/17 0553  AST 27 21  ALT 16 13*  ALKPHOS 89 67  BILITOT 1.0 0.8  PROT 8.4* 6.6  ALBUMIN 3.5 2.7*   Recent Labs  Lab  08/20/17 1909  LIPASE 80*   No results for input(s): AMMONIA in the last 168 hours. Coagulation Profile: Recent Labs  Lab 08/20/17 1909  INR 0.89   Cardiac Enzymes: No results for input(s): CKTOTAL, CKMB, CKMBINDEX, TROPONINI in the last 168 hours. BNP (last 3 results) No results for input(s): PROBNP in the last 8760 hours. HbA1C: No results for input(s): HGBA1C in the last 72 hours. CBG: No results for input(s): GLUCAP in the last 168 hours. Lipid Profile: No results for input(s): CHOL, HDL, LDLCALC, TRIG, CHOLHDL, LDLDIRECT in the last 72 hours. Thyroid Function Tests: No results for input(s): TSH,  T4TOTAL, FREET4, T3FREE, THYROIDAB in the last 72 hours. Anemia Panel: No results for input(s): VITAMINB12, FOLATE, FERRITIN, TIBC, IRON, RETICCTPCT in the last 72 hours. Sepsis Labs: No results for input(s): PROCALCITON, LATICACIDVEN in the last 168 hours.  Recent Results (from the past 240 hour(s))  Culture, Urine     Status: Abnormal   Collection Time: 08/12/17  9:50 PM  Result Value Ref Range Status   Specimen Description   Final    URINE, RANDOM Performed at [facility], [address], [place], [place] [address]    Special Requests   Final    NONE Performed at [facility], [address], [place], [place] [address]    Culture MULTIPLE SPECIES PRESENT, SUGGEST RECOLLECTION (A)  Final   Report Status 08/14/2017 FINAL  Final  Culture, Urine     Status: Abnormal   Collection Time: 08/14/17  3:15 PM  Result Value Ref Range Status   Specimen Description   Final    URINE, CATHETERIZED Performed at [facility], [address], [place], [place] [address]    Special Requests   Final    NONE Performed at [facility], [address] [place], [place] [address]    Culture MULTIPLE SPECIES PRESENT, SUGGEST RECOLLECTION (A)  Final   Report Status 08/16/2017 FINAL  Final  Urine Culture     Status: Abnormal (Preliminary result)   Collection Time: 08/20/17  6:43 PM  Result Value Ref Range Status   Specimen Description   Final    URINE, CATHETERIZED Performed at [facility], [address] [place], [place] [address]    Special Requests   Final    NONE Performed at [facility], [address], [place], [place] [address]    Culture (A)  Final    >=100,000 COLONIES/mL PROTEUS MIRABILIS SUSCEPTIBILITIES TO FOLLOW Performed at [facility], [address], [place], [place] [address]    Report Status PENDING  Incomplete  MRSA PCR Screening     Status: None   Collection Time: 08/21/17 12:59 AM  Result Value Ref Range Status   MRSA by PCR NEGATIVE NEGATIVE Final    Comment:         The GeneXpert MRSA Assay (FDA approved for NASAL specimens only), is one component of a comprehensive MRSA colonization surveillance program. It is not intended to diagnose MRSA infection nor to guide or monitor treatment for MRSA infections. Performed at [facility], [address], [place], [place] [address]          Radiology Studies: Ct Abdomen Pelvis W Contrast  Result Date: 08/20/2017 CLINICAL DATA:  84 year old female with lower abdominal pain and burning with urination recently. EXAM: CT ABDOMEN AND PELVIS WITH CONTRAST TECHNIQUE: Multidetector CT imaging of the abdomen and pelvis was performed using the standard protocol following bolus administration of intravenous contrast. CONTRAST:  80mL ISOVUE-300 IOPAMIDOL (ISOVUE-300)  INJECTION 61%, 30mL ISOVUE-300 IOPAMIDOL (ISOVUE-300) INJECTION 61% COMPARISON:  CT Abdomen and Pelvis 04/22/2017. FINDINGS: Lower chest: Stable lung bases. Chronic left lower lobe calcified granuloma, and superimposed bilateral lower lobe scarring. No pericardial or pleural effusion. Hepatobiliary: Stable liver and gallbladder. Pancreas: Pancreas is stable and within normal limits. Spleen: Stable diminutive spleen. Adrenals/Urinary Tract: Normal adrenal glands. Bilateral renal enhancement and contrast excretion is symmetric and within normal limits. No hydronephrosis or hydroureter. The urinary bladder is mildly to moderately distended and the wall of the bladder does appear to be hyperenhancing (series 2, image 57). There is no definite perivesical inflammatory stranding. Stomach/Bowel: Stool ball in the rectum with superimposed rectal wall thickening, and mucosal hyperenhancement at the anal verge (series 2, image 69). Additional imaging through the entire perineum was obtained, and there is no associated perineum subcutaneous cellulitis or inflammation. Rectal mucosal hyperenhancement also suspected. The distal sigmoid colon is decompressed but also appears  indistinct. The proximal sigmoid colon has a more normal appearance. Retained stool resumes in the descending colon. Oral contrast has reached the mid transverse colon and is mixed with stool. The cecum is moderately distended with gas and to a lesser extent contrast, similar to the January CT. Decompressed distal small bowel. No dilated small bowel loops. The stomach and duodenum are within normal limits. No abdominal free air.  No abdominal free fluid identified. Vascular/Lymphatic: Aortoiliac calcified atherosclerosis. The major arterial structures in the abdomen and pelvis are patent. The portal venous system is patent. Reproductive: Negative. Other: Small volume of pelvic free fluid and superimposed presacral soft tissue edema. Musculoskeletal: Chronic T12 compression fracture is stable. However, sclerosis of the right sacral ala is new since January compatible with a sacral insufficiency fracture. This is fairly nondisplaced. Bilateral anterior pubic rami fractures demonstrate interval callus formation, although there may be incomplete healing. IMPRESSION: 1. Perirectal and deep pelvic edema and small volume free fluid with inflammation from the anal verge to the distal sigmoid favored to reflect Acute Proctitis. Superimposed mild to moderate rectal fecal impaction. 2. There is also hyperenhancement of the wall of the urinary bladder. Consider a secondary bladder inflammation or infection. 3. Right sacral insufficiency fracture, new since January but appears subacute. 4. Healing pubic rami fractures.  Stable T12 compression fracture. Electronically Signed   By: Odessa Fleming M.D.   On: 08/20/2017 20:41        Scheduled Meds: . atenolol  25 mg Oral Daily  . docusate sodium  100 mg Oral BID  . enoxaparin (LOVENOX) injection  30 mg Subcutaneous Q24H  . feeding supplement  1 Container Oral TID BM  . feeding supplement (ENSURE ENLIVE)  237 mL Oral TID BM  . mometasone-formoterol  2 puff Inhalation BID  .  polyethylene glycol  17 g Oral QID  . rotigotine  1 patch Transdermal Daily  . timolol  1 drop Both Eyes BID   Continuous Infusions: . sodium chloride 50 mL/hr at 08/21/17 2154  . ciprofloxacin Stopped (08/22/17 0957)  . metronidazole Stopped (08/22/17 1408)     LOS: 2 days    Time spent:    Erick Blinks, MD [facility] Pager [phone]  If 7PM-7AM, please contact night-coverage [email or web address] Password [email or web address] 08/22/2017, 5:14 PM

## 2017-08-22 NOTE — Progress Notes (Signed)
No significant BM since admission. Patient has trouble cooperating with enemas. Discussed with daughter and nursing staff at length..   Vital signs in last 24 hours: Temp:  [97.6 F (36.4 C)-98.7 F (37.1 C)] 97.6 F (36.4 C) (05/12 0600) Pulse Rate:  [68-77] 76 (05/12 0600) Resp:  [16-18] 18 (05/12 0600) BP: (138-155)/(77-90) 138/90 (05/12 0600) SpO2:  [96 %-97 %] 96 % (05/12 0753)   General:   Alert,   pleasant and cooperative in NAD Abdomen:  Non-distended. Positive bowel sounds. Soft and nontender. Extremities:  Without clubbing or edema.    Intake/Output from previous day: 05/11 0701 - 05/12 0700 In: 2010 [P.O.:60; I.V.:1350; IV Piggyback:600] Out: 1400 [Urine:1400] Intake/Output this shift: Total I/O In: 840 [P.O.:240; I.V.:200; IV Piggyback:400] Out: -   Lab Results: Recent Labs    08/20/17 1807 08/21/17 0553  WBC 10.4 8.3  HGB 13.4 11.4*  HCT 41.9 35.9*  PLT 325 299   BMET Recent Labs    08/20/17 1807 08/21/17 0553  NA 135 139  K 3.7 4.0  CL 99* 107  CO2 29 26  GLUCOSE 101* 91  BUN 21* 14  CREATININE 0.82 0.62  CALCIUM 9.3 8.5*   LFT Recent Labs    08/21/17 0553  PROT 6.6  ALBUMIN 2.7*  AST 21  ALT 13*  ALKPHOS 67  BILITOT 0.8   PT/INR Recent Labs    08/20/17 1909  LABPROT 11.9  INR 0.89   Hepatitis Panel No results for input(s): HEPBSAG, HCVAB, HEPAIGM, HEPBIGM in the last 72 hours. C-Diff No results for input(s): CDIFFTOX in the last 72 hours.  Studies/Results: Ct Abdomen Pelvis W Contrast  Result Date: 08/20/2017 CLINICAL DATA:  84 year old female with lower abdominal pain and burning with urination recently. EXAM: CT ABDOMEN AND PELVIS WITH CONTRAST TECHNIQUE: Multidetector CT imaging of the abdomen and pelvis was performed using the standard protocol following bolus administration of intravenous contrast. CONTRAST:  80mL ISOVUE-300 IOPAMIDOL (ISOVUE-300) INJECTION 61%, 30mL ISOVUE-300 IOPAMIDOL (ISOVUE-300) INJECTION 61%  COMPARISON:  CT Abdomen and Pelvis 04/22/2017. FINDINGS: Lower chest: Stable lung bases. Chronic left lower lobe calcified granuloma, and superimposed bilateral lower lobe scarring. No pericardial or pleural effusion. Hepatobiliary: Stable liver and gallbladder. Pancreas: Pancreas is stable and within normal limits. Spleen: Stable diminutive spleen. Adrenals/Urinary Tract: Normal adrenal glands. Bilateral renal enhancement and contrast excretion is symmetric and within normal limits. No hydronephrosis or hydroureter. The urinary bladder is mildly to moderately distended and the wall of the bladder does appear to be hyperenhancing (series 2, image 57). There is no definite perivesical inflammatory stranding. Stomach/Bowel: Stool ball in the rectum with superimposed rectal wall thickening, and mucosal hyperenhancement at the anal verge (series 2, image 69). Additional imaging through the entire perineum was obtained, and there is no associated perineum subcutaneous cellulitis or inflammation. Rectal mucosal hyperenhancement also suspected. The distal sigmoid colon is decompressed but also appears indistinct. The proximal sigmoid colon has a more normal appearance. Retained stool resumes in the descending colon. Oral contrast has reached the mid transverse colon and is mixed with stool. The cecum is moderately distended with gas and to a lesser extent contrast, similar to the January CT. Decompressed distal small bowel. No dilated small bowel loops. The stomach and duodenum are within normal limits. No abdominal free air.  No abdominal free fluid identified. Vascular/Lymphatic: Aortoiliac calcified atherosclerosis. The major arterial structures in the abdomen and pelvis are patent. The portal venous system is patent. Reproductive: Negative. Other: Small volume of pelvic free  fluid and superimposed presacral soft tissue edema. Musculoskeletal: Chronic T12 compression fracture is stable. However, sclerosis of the right  sacral ala is new since January compatible with a sacral insufficiency fracture. This is fairly nondisplaced. Bilateral anterior pubic rami fractures demonstrate interval callus formation, although there may be incomplete healing. IMPRESSION: 1. Perirectal and deep pelvic edema and small volume free fluid with inflammation from the anal verge to the distal sigmoid favored to reflect Acute Proctitis. Superimposed mild to moderate rectal fecal impaction. 2. There is also hyperenhancement of the wall of the urinary bladder. Consider a secondary bladder inflammation or infection. 3. Right sacral insufficiency fracture, new since January but appears subacute. 4. Healing pubic rami fractures.  Stable T12 compression fracture. Electronically Signed   By: Odessa Fleming M.D.   On: 08/20/2017 20:41    Impression: 84 year old lady with dementia/Parkinson's disease admitted with abdominal pain, urinary traction infection and fecal impaction. Abdominal pain appears to have improved with antibiotics.  No significant results with bowel regimen thus far.  Recommendations:  We'll continue Colace; hold off on further enemas. Escalate MiraLAX dosing to 17 g orally every 6 hours. Will reassess tomorrow morning.

## 2017-08-23 DIAGNOSIS — K5900 Constipation, unspecified: Secondary | ICD-10-CM

## 2017-08-23 DIAGNOSIS — K5909 Other constipation: Secondary | ICD-10-CM

## 2017-08-23 LAB — URINE CULTURE

## 2017-08-23 MED ORDER — AMOXICILLIN-POT CLAVULANATE 875-125 MG PO TABS
1.0000 | ORAL_TABLET | Freq: Two times a day (BID) | ORAL | Status: DC
  Administered 2017-08-24 (×2): 1 via ORAL
  Filled 2017-08-23 (×2): qty 1

## 2017-08-23 MED ORDER — BISACODYL 10 MG RE SUPP
10.0000 mg | Freq: Once | RECTAL | Status: AC
  Administered 2017-08-23: 10 mg via RECTAL
  Filled 2017-08-23: qty 1

## 2017-08-23 NOTE — Progress Notes (Signed)
    Subjective: Still without BM, discussed with nursing. Nursing staff stated difficulty with taking Miralax dosing yesterday.   Objective: Vital signs in last 24 hours: Temp:  [97.5 F (36.4 C)-98.9 F (37.2 C)] 98.9 F (37.2 C) (05/13 0541) Pulse Rate:  [64-66] 64 (05/13 0541) Resp:  [14-18] 16 (05/13 0541) BP: (114-146)/(63-93) 119/70 (05/13 0541) SpO2:  [96 %-99 %] 97 % (05/13 0837)   General:   Alert and oriented to person, no apparent distress  Head:  Normocephalic and atraumatic. Abdomen:  Bowel sounds present, soft, non-tender, non-distended.  Neurologic:  Pleasantly confused    Intake/Output from previous day: 05/12 0701 - 05/13 0700 In: 2330 [P.O.:480; I.V.:1050; IV Piggyback:800] Out: 200 [Urine:200] Intake/Output this shift: No intake/output data recorded.  Lab Results: Recent Labs    08/20/17 1807 08/21/17 0553  WBC 10.4 8.3  HGB 13.4 11.4*  HCT 41.9 35.9*  PLT 325 299   BMET Recent Labs    08/20/17 1807 08/21/17 0553  NA 135 139  K 3.7 4.0  CL 99* 107  CO2 29 26  GLUCOSE 101* 91  BUN 21* 14  CREATININE 0.82 0.62  CALCIUM 9.3 8.5*   LFT Recent Labs    08/20/17 1807 08/21/17 0553  PROT 8.4* 6.6  ALBUMIN 3.5 2.7*  AST 27 21  ALT 16 13*  ALKPHOS 89 67  BILITOT 1.0 0.8   PT/INR Recent Labs    08/20/17 1909  LABPROT 11.9  INR 0.89    Assessment: 84 year old female admitted with abdominal pain and UTI, with large stool bolus in rectum on CT with associated rectal and sigmoid edema likely more related to stool burden. No significant results with bowel regimen despite increasing Miralax to every 6 hours and continuing colace. She has had one full day of 4 doses of Miralax. Discussed with nursing staff, who state it has been difficult to have her drink this. I've asked nursing staff to update me regarding status today and compliance with Miralax. Will add suppository X 1 now.   Plan: Dulcolax suppository now Continue Miralax every 6  hours: call if not compliant with taking full dose Will continue to follow with you  Gelene Mink, PhD, ANP-BC [facility]    LOS: 3 days    08/23/2017, 9:55 AM

## 2017-08-23 NOTE — Progress Notes (Signed)
Pt has refused nutritional supplements including Boost and Ensure. Pt has also refused Miralax. However, pt has had a large, formed bowel movement. Will continue to monitor patient.

## 2017-08-23 NOTE — Care Management Important Message (Signed)
Important Message  Patient Details  Name: Sandra Davis MRN: [id number] Date of Birth: October 27, 1928   Medicare Important Message Given:  Yes    Renie Ora 08/23/2017, 12:48 PM

## 2017-08-23 NOTE — Progress Notes (Signed)
PROGRESS NOTE    Sandra Davis  JXB:[id number] DOB: 02/01/1929 DOA: 08/20/2017 PCP: Estevan Oaks, NP    Brief Narrative:  84 year old female with a history of dementia and Parkinson's disease, admitted to the hospital with abdominal pain.  Found to have moderate fecal impaction as well as possible urinary tract infection.  Started on intravenous antibiotics.  GI consulted she is currently on a bowel regimen.  She has not had any bowel movements as of yet.   Assessment & Plan:   Active Problems:   Parkinson's disease (HCC)   Dementia   UTI (urinary tract infection)   Proctitis   Constipation   1. Urinary tract infection.  Urine culture positive for Proteus.  On intravenous ciprofloxacin.  Based on sensitivities, will de-escalate to Augmentin 2. Fecal impaction/proctitis.  GI input appreciated.  She was treated with MiraLAX as well as milk and molasses enemas.  She did not have any significant bowel movement with enemas.  MiraLAX dose has been increased.  She may need another agent. ?Linzess 3. Hypertension.  On atenolol.  Blood pressure stable  4. Anxiety.  Continue as needed lorazepam 5. Parkinson's disease.  Continue on Neupro transdermal   DVT prophylaxis: Lovenox Code Status: DNR Family Communication: Discussed with daughter at the bedside Disposition Plan: Return to nursing facility on discharge   Consultants:   Gastroenterology  Procedures:     Antimicrobials:   Cipro 5/10 > 5/13  Flagyl 5/10 > 5/13  Augmentin 5/13 >   Subjective: Patient did not have response to MiraLAX and enemas thus far.  GI following.  Objective: Vitals:   08/22/17 2143 08/23/17 0541 08/23/17 0837 08/23/17 1459  BP: (!) 146/93 119/70  138/72  Pulse: 66 64  68  Resp: Temp: (!) 97.5 F (36.4 C) 98.9 F (37.2 C)    TempSrc: Oral Oral    SpO2: 99% 98% 97% 97%  Weight:      Height:        Intake/Output Summary (Last 24 hours) at 08/23/2017 1855 Last data filed  at 08/23/2017 0900 Gross per 24 hour  Intake 750 ml  Output -  Net 750 ml   Filed Weights   08/20/17 1747 08/20/17 2336  Weight: 41.7 kg (92 lb) 42 kg (92 lb 9.5 oz)    Examination:  General exam: No distress Respiratory system: Clear to auscultation. Respiratory effort normal. Cardiovascular system:RRR. No murmurs, rubs, gallops. Gastrointestinal system: Abdomen is nondistended, soft and nontender. No organomegaly or masses felt. Normal bowel sounds heard. Central nervous system: No focal neurological deficits. Extremities: No C/C/E, +pedal pulses Skin: No rashes, lesions or ulcers Psychiatry: Confused  Data Reviewed: I have personally reviewed following labs and imaging studies  CBC: Recent Labs  Lab 08/20/17 1807 08/21/17 0553  WBC 10.4 8.3  NEUTROABS 6.4  --   HGB 13.4 11.4*  HCT 41.9 35.9*  MCV 90.1 90.4  PLT 325 299   Basic Metabolic Panel: Recent Labs  Lab 08/20/17 1807 08/21/17 0553  NA 135 139  K 3.7 4.0  CL 99* 107  CO2 29 26  GLUCOSE 101* 91  BUN 21* 14  CREATININE 0.82 0.62  CALCIUM 9.3 8.5*   GFR: Estimated Creatinine Clearance: 32.6 mL/min (by C-G formula based on SCr of 0.62 mg/dL). Liver Function Tests: Recent Labs  Lab 08/20/17 1807 08/21/17 0553  AST 27 21  ALT 16 13*  ALKPHOS 89 67  BILITOT 1.0 0.8  PROT 8.4* 6.6  ALBUMIN 3.5  2.7*   Recent Labs  Lab 08/20/17 1909  LIPASE 80*   No results for input(s): AMMONIA in the last 168 hours. Coagulation Profile: Recent Labs  Lab 08/20/17 1909  INR 0.89   Cardiac Enzymes: No results for input(s): CKTOTAL, CKMB, CKMBINDEX, TROPONINI in the last 168 hours. BNP (last 3 results) No results for input(s): PROBNP in the last 8760 hours. HbA1C: No results for input(s): HGBA1C in the last 72 hours. CBG: No results for input(s): GLUCAP in the last 168 hours. Lipid Profile: No results for input(s): CHOL, HDL, LDLCALC, TRIG, CHOLHDL, LDLDIRECT in the last 72 hours. Thyroid Function  Tests: No results for input(s): TSH, T4TOTAL, FREET4, T3FREE, THYROIDAB in the last 72 hours. Anemia Panel: No results for input(s): VITAMINB12, FOLATE, FERRITIN, TIBC, IRON, RETICCTPCT in the last 72 hours. Sepsis Labs: No results for input(s): PROCALCITON, LATICACIDVEN in the last 168 hours.  Recent Results (from the past 240 hour(s))  Culture, Urine     Status: Abnormal   Collection Time: 08/14/17  3:15 PM  Result Value Ref Range Status   Specimen Description   Final    URINE, CATHETERIZED Performed at [facility], [address], [place], [place] [address]    Special Requests   Final    NONE Performed at [facility], [address], [place], [place] [address]    Culture MULTIPLE SPECIES PRESENT, SUGGEST RECOLLECTION (A)  Final   Report Status 08/16/2017 FINAL  Final  Urine Culture     Status: Abnormal   Collection Time: 08/20/17  6:43 PM  Result Value Ref Range Status   Specimen Description   Final    URINE, CATHETERIZED Performed at [facility], [address], [place], [place] [address]    Special Requests   Final    NONE Performed at [facility], [address] [place], [place] [address]    Culture >=100,000 COLONIES/mL PROTEUS MIRABILIS (A)  Final   Report Status 08/23/2017 FINAL  Final   Organism ID, Bacteria PROTEUS MIRABILIS (A)  Final      Susceptibility   Proteus mirabilis - MIC*    AMPICILLIN <=2 SENSITIVE Sensitive     CEFAZOLIN <=4 SENSITIVE Sensitive     CEFTRIAXONE <=1 SENSITIVE Sensitive     CIPROFLOXACIN <=0.25 SENSITIVE Sensitive     GENTAMICIN <=1 SENSITIVE Sensitive     IMIPENEM 8 INTERMEDIATE Intermediate     NITROFURANTOIN 128 RESISTANT Resistant     TRIMETH/SULFA <=20 SENSITIVE Sensitive     AMPICILLIN/SULBACTAM <=2 SENSITIVE Sensitive     PIP/TAZO <=4 SENSITIVE Sensitive     * >=100,000 COLONIES/mL PROTEUS MIRABILIS  MRSA PCR Screening     Status: None   Collection Time: 08/21/17 12:59 AM  Result Value Ref Range Status   MRSA by PCR  NEGATIVE NEGATIVE Final    Comment:        The GeneXpert MRSA Assay (FDA approved for NASAL specimens only), is one component of a comprehensive MRSA colonization surveillance program. It is not intended to diagnose MRSA infection nor to guide or monitor treatment for MRSA infections. Performed at [facility], [address], [place], [place] [address]          Radiology Studies: No results found.      Scheduled Meds: . amoxicillin-clavulanate  1 tablet Oral Q12H  . atenolol  25 mg Oral Daily  . docusate sodium  100 mg Oral BID  . enoxaparin (LOVENOX) injection  30 mg Subcutaneous Q24H  . feeding supplement  1  Container Oral TID BM  . feeding supplement (ENSURE ENLIVE)  237 mL Oral TID BM  . mometasone-formoterol  2 puff Inhalation BID  . polyethylene glycol  17 g Oral QID  . rotigotine  1 patch Transdermal Daily  . timolol  1 drop Both Eyes BID   Continuous Infusions: . sodium chloride 50 mL/hr at 08/23/17 1112     LOS: 3 days    Time spent:    Erick Blinks, MD [facility] Pager [phone]  If 7PM-7AM, please contact night-coverage [email or web address] Password [facility] 08/23/2017, 6:55 PM

## 2017-08-24 ENCOUNTER — Inpatient Hospital Stay
Admission: RE | Admit: 2017-08-24 | Discharge: 2018-02-25 | Disposition: A | Discharge status: Nursing Home (Includes ICF) | Payer: Medicare Other | Source: Ambulatory Visit | Attending: Internal Medicine | Admitting: Internal Medicine

## 2017-08-24 DIAGNOSIS — R296 Repeated falls: Secondary | ICD-10-CM | POA: Diagnosis not present

## 2017-08-24 DIAGNOSIS — S3282XD Multiple fractures of pelvis without disruption of pelvic ring, subsequent encounter for fracture with routine healing: Secondary | ICD-10-CM | POA: Diagnosis not present

## 2017-08-24 DIAGNOSIS — B964 Proteus (mirabilis) (morganii) as the cause of diseases classified elsewhere: Secondary | ICD-10-CM | POA: Diagnosis not present

## 2017-08-24 DIAGNOSIS — M79642 Pain in left hand: Secondary | ICD-10-CM | POA: Diagnosis not present

## 2017-08-24 DIAGNOSIS — K625 Hemorrhage of anus and rectum: Secondary | ICD-10-CM | POA: Diagnosis not present

## 2017-08-24 DIAGNOSIS — J410 Simple chronic bronchitis: Secondary | ICD-10-CM | POA: Diagnosis not present

## 2017-08-24 DIAGNOSIS — M810 Age-related osteoporosis without current pathological fracture: Secondary | ICD-10-CM | POA: Diagnosis not present

## 2017-08-24 DIAGNOSIS — R350 Frequency of micturition: Secondary | ICD-10-CM | POA: Diagnosis not present

## 2017-08-24 DIAGNOSIS — F419 Anxiety disorder, unspecified: Secondary | ICD-10-CM | POA: Diagnosis not present

## 2017-08-24 DIAGNOSIS — K5900 Constipation, unspecified: Secondary | ICD-10-CM | POA: Diagnosis not present

## 2017-08-24 DIAGNOSIS — R600 Localized edema: Secondary | ICD-10-CM | POA: Diagnosis not present

## 2017-08-24 DIAGNOSIS — F0281 Dementia in other diseases classified elsewhere with behavioral disturbance: Secondary | ICD-10-CM | POA: Diagnosis not present

## 2017-08-24 DIAGNOSIS — N390 Urinary tract infection, site not specified: Secondary | ICD-10-CM | POA: Diagnosis not present

## 2017-08-24 DIAGNOSIS — Z4789 Encounter for other orthopedic aftercare: Secondary | ICD-10-CM | POA: Diagnosis not present

## 2017-08-24 DIAGNOSIS — R609 Edema, unspecified: Secondary | ICD-10-CM | POA: Diagnosis not present

## 2017-08-24 DIAGNOSIS — M79622 Pain in left upper arm: Secondary | ICD-10-CM | POA: Diagnosis not present

## 2017-08-24 DIAGNOSIS — R279 Unspecified lack of coordination: Secondary | ICD-10-CM | POA: Diagnosis not present

## 2017-08-24 DIAGNOSIS — H6123 Impacted cerumen, bilateral: Secondary | ICD-10-CM | POA: Diagnosis not present

## 2017-08-24 DIAGNOSIS — B351 Tinea unguium: Secondary | ICD-10-CM | POA: Diagnosis not present

## 2017-08-24 DIAGNOSIS — R300 Dysuria: Secondary | ICD-10-CM | POA: Diagnosis not present

## 2017-08-24 DIAGNOSIS — L251 Unspecified contact dermatitis due to drugs in contact with skin: Secondary | ICD-10-CM | POA: Diagnosis not present

## 2017-08-24 DIAGNOSIS — F0390 Unspecified dementia without behavioral disturbance: Secondary | ICD-10-CM | POA: Diagnosis not present

## 2017-08-24 DIAGNOSIS — I10 Essential (primary) hypertension: Secondary | ICD-10-CM | POA: Diagnosis not present

## 2017-08-24 DIAGNOSIS — M545 Low back pain: Secondary | ICD-10-CM | POA: Diagnosis not present

## 2017-08-24 DIAGNOSIS — G20 Parkinson's disease: Secondary | ICD-10-CM | POA: Diagnosis not present

## 2017-08-24 DIAGNOSIS — S32591S Other specified fracture of right pubis, sequela: Secondary | ICD-10-CM | POA: Diagnosis not present

## 2017-08-24 DIAGNOSIS — E876 Hypokalemia: Secondary | ICD-10-CM | POA: Diagnosis not present

## 2017-08-24 DIAGNOSIS — L600 Ingrowing nail: Secondary | ICD-10-CM | POA: Diagnosis not present

## 2017-08-24 DIAGNOSIS — R262 Difficulty in walking, not elsewhere classified: Secondary | ICD-10-CM | POA: Diagnosis not present

## 2017-08-24 DIAGNOSIS — R5383 Other fatigue: Secondary | ICD-10-CM | POA: Diagnosis not present

## 2017-08-24 DIAGNOSIS — L603 Nail dystrophy: Secondary | ICD-10-CM | POA: Diagnosis not present

## 2017-08-24 DIAGNOSIS — R197 Diarrhea, unspecified: Secondary | ICD-10-CM | POA: Diagnosis not present

## 2017-08-24 DIAGNOSIS — S32592S Other specified fracture of left pubis, sequela: Secondary | ICD-10-CM | POA: Diagnosis not present

## 2017-08-24 DIAGNOSIS — R319 Hematuria, unspecified: Secondary | ICD-10-CM | POA: Diagnosis not present

## 2017-08-24 DIAGNOSIS — M6281 Muscle weakness (generalized): Secondary | ICD-10-CM | POA: Diagnosis not present

## 2017-08-24 DIAGNOSIS — K6289 Other specified diseases of anus and rectum: Secondary | ICD-10-CM | POA: Diagnosis not present

## 2017-08-24 DIAGNOSIS — I739 Peripheral vascular disease, unspecified: Secondary | ICD-10-CM | POA: Diagnosis not present

## 2017-08-24 MED ORDER — DOCUSATE SODIUM 100 MG PO CAPS: 100.0000 mg | ORAL_CAPSULE | Freq: Two times a day (BID) | ORAL | 0 refills | Status: DC

## 2017-08-24 MED ORDER — LINACLOTIDE 145 MCG PO CAPS: 145.0000 ug | ORAL_CAPSULE | Freq: Every day | ORAL | Status: DC

## 2017-08-24 MED ORDER — POLYETHYLENE GLYCOL 3350 17 G PO PACK: 17.0000 g | PACK | Freq: Every day | ORAL | 0 refills | Status: DC | PRN

## 2017-08-24 MED ORDER — BISACODYL 10 MG RE SUPP: 10.0000 mg | RECTAL | 0 refills | Status: AC | PRN

## 2017-08-24 NOTE — Progress Notes (Signed)
    Subjective: No abdominal pain. Large BM yesterday evening per nursing notes.   Objective: Vital signs in last 24 hours: Temp:  [97.7 F (36.5 C)-98.1 F (36.7 C)] 97.7 F (36.5 C) (05/14 0634) Pulse Rate:  [68-73] 73 (05/14 0634) Resp:  [17-18] 18 (05/14 0634) BP: (138-181)/(72-90) 181/90 (05/14 0634) SpO2:  [95 %-100 %] 100 % (05/14 0634) Last BM Date: 08/23/17 General:   Alert and oriented to person, pleasant.  Head:  Normocephalic and atraumatic. Abdomen:  Bowel sounds present, soft, non-tender, non-distended. No HSM or hernias noted. Neurologic:  Alert and  oriented to person   Intake/Output from previous day: 05/13 0701 - 05/14 0700 In: 600 [P.O.:100; I.V.:500] Out: -  Intake/Output this shift: No intake/output data recorded.  Assessment: 84 year old female admitted with abdominal pain and UTI, with large stool bolus in rectum on CT with associated rectal and sigmoid edema likely more related to stool burden. Miralax QID, colace on board. Suppository X 1 yesterday. Large BM yesterday per nursing notes.Compliance with Miralax has been an issue. Will start Linzess once daily to hopefully prevent further issues. Signing off.     Plan: Linzess 145 mcg daily, 30 minutes before breakfast. May need to titrate down to 72 mcg daily. Could consider Amitiza as well if needed.  Will sign off for now.   Gelene Mink, PhD, ANP-BC [facility]    LOS: 4 days    08/24/2017, 8:03 AM

## 2017-08-24 NOTE — Clinical Social Work Note (Signed)
Son, Raiford Noble, notified of discharge and already aware. Facility notified and discharge clinicals sent.  LCSW signing off.     Eljay Lave, Juleen China, LCSW

## 2017-08-24 NOTE — Progress Notes (Signed)
AVS paperwork printed out. This nurse signed for pt due to confusion. VSS. Report called to [facility]. Staff familiar with pt. PIV removed prior to d/c. Copy of DNR in Epic printed and sent in paperwork.

## 2017-08-24 NOTE — Discharge Summary (Signed)
Physician Discharge Summary  Sandra Davis ZOX:[id number] DOB: Aug 05, 1928 DOA: 08/20/2017  PCP: Estevan Oaks, NP  Admit date: 08/20/2017 Discharge date: 08/24/2017  Admitted From: Skilled nursing facility Disposition: Skilled nursing facility  Recommendations for Outpatient Follow-up:  1. Follow up with PCP in 1-2 weeks 2. Please obtain BMP/CBC in one week 3. Continue to monitor bowel movements, if has frequent bowel movements, may need to decrease Linzess dosing 4. Family has requested pure wic urinary catheter at nursing home if available 5. Consider outpatient palliative care evaluation for progressive dementia  Discharge Condition: Stable CODE STATUS: DNR Diet recommendation: Heart Healthy   Brief/Interim Summary: 84 year old female with a history of dementia and Parkinson's disease, who is a resident of a nursing facility, was admitted to the hospital with abdominal pain.  She was found to have moderate fecal impaction and stool burden and rectum as well as a urinary tract infection.  Patient was started on intravenous antibiotics.  Urine culture positive for Proteus and she was adequately treated in the hospital.  Gastroenterology was following patient for bowel issues.  She was initially started on MiraLAX as well as milk of molasses enema, but did not have any significant results.  MiraLAX dosing was increased to receive Dulcolax suppositories.  Patient did have a large bowel movement after this.  She will be started on Linzess, continued on Colace and have MiraLAX as well as Dulcolax suppositories to be used as needed.  Patient appears to be back to her baseline.  I discussed her status with her son.  We also discussed her diminishing appetite which is likely consistent with her progressive dementia.  Patient may benefit from palliative care/hospice evaluation as an outpatient if her appetite continues to decline.  Discharge Diagnoses:  Active Problems:   Parkinson's disease  (HCC)   Dementia   UTI (urinary tract infection)   Proctitis   Constipation    Discharge Instructions  Discharge Instructions    Diet - low sodium heart healthy   Complete by:  As directed    Increase activity slowly   Complete by:  As directed      Allergies as of 08/24/2017      Reactions   Hydrochlorothiazide Other (See Comments)   Reaction:  Dizziness    Lisinopril Cough   Other Other (See Comments)   Pt states that she is allergic to multiple BP meds -- does not recall the names.   Reaction:  Dizziness    Spironolactone Other (See Comments)   Reaction:  Blurred vision and sweating of feet       Medication List    TAKE these medications   acetaminophen 325 MG tablet Commonly known as:  TYLENOL Take 650 mg by mouth every 6 (six) hours as needed.   albuterol 108 (90 Base) MCG/ACT inhaler Commonly known as:  PROVENTIL HFA;VENTOLIN HFA Inhale 1-2 puffs into the lungs every 6 (six) hours as needed for wheezing or shortness of breath.   aspirin EC 81 MG tablet Take 81 mg by mouth daily.   atenolol 50 MG tablet Commonly known as:  TENORMIN Take 25 mg by mouth daily.   bisacodyl 10 MG suppository Commonly known as:  DULCOLAX Place 1 suppository (10 mg total) rectally as needed for moderate constipation.   budesonide-formoterol 160-4.5 MCG/ACT inhaler Commonly known as:  SYMBICORT Inhale 2 puffs into the lungs 2 (two) times daily.   docusate sodium 100 MG capsule Commonly known as:  COLACE Take 1 capsule (100 mg total) by  mouth 2 (two) times daily.   fluconazole 100 MG tablet Commonly known as:  DIFLUCAN Take 100 mg by mouth daily.   linaclotide 145 MCG Caps capsule Commonly known as:  LINZESS Take 1 capsule (145 mcg total) by mouth daily before breakfast. Start taking on:  08/25/2017   loperamide 2 MG tablet Commonly known as:  IMODIUM A-D Take 2 mg by mouth 4 (four) times daily as needed for diarrhea or loose stools.   LORazepam 0.5 MG  tablet Commonly known as:  ATIVAN Take 0.5 tablets (0.25 mg total) by mouth 2 (two) times daily as needed for anxiety.   Melatonin 3 MG Tabs Take 3 mg by mouth at bedtime.   Menthol 7.5 % Ptch Apply 1 patch topically daily.   nitroGLYCERIN 0.4 MG SL tablet Commonly known as:  NITROSTAT Place 1 tablet under the tongue as directed. Place 1 tablet under the tongue every five minutes as needed for emergency chest pain   omeprazole 40 MG capsule Commonly known as:  PRILOSEC Take 40 mg by mouth daily as needed (for acid reflux).   polyethylene glycol packet Commonly known as:  MIRALAX / GLYCOLAX Take 17 g by mouth daily as needed.   Polyvinyl Alcohol-Povidone PF 1.4-0.6 % Soln Apply 1 drop to eye 2 (two) times daily as needed.   RISA-BID PROBIOTIC Tabs Take 1 tablet by mouth twice a day   rotigotine 6 MG/24HR Commonly known as:  NEUPRO Place 1 patch onto the skin daily.   timolol 0.5 % ophthalmic solution Commonly known as:  BETIMOL Place 1 drop into both eyes 2 (two) times daily.      Contact information for after-discharge care    Destination    [facility] .   Service:  Skilled Nursing Contact information: [address] [place] [address] [phone]             Allergies  Allergen Reactions  . Hydrochlorothiazide Other (See Comments)    Reaction:  Dizziness   . Lisinopril Cough  . Other Other (See Comments)    Pt states that she is allergic to multiple BP meds -- does not recall the names.   Reaction:  Dizziness   . Spironolactone Other (See Comments)    Reaction:  Blurred vision and sweating of feet     Consultations:  Gastroenterology   Procedures/Studies: Ct Abdomen Pelvis W Contrast  Result Date: 08/20/2017 CLINICAL DATA:  84 year old female with lower abdominal pain and burning with urination recently. EXAM: CT ABDOMEN AND PELVIS WITH CONTRAST TECHNIQUE: Multidetector CT imaging of the abdomen and pelvis  was performed using the standard protocol following bolus administration of intravenous contrast. CONTRAST:  80mL ISOVUE-300 IOPAMIDOL (ISOVUE-300) INJECTION 61%, 30mL ISOVUE-300 IOPAMIDOL (ISOVUE-300) INJECTION 61% COMPARISON:  CT Abdomen and Pelvis 04/22/2017. FINDINGS: Lower chest: Stable lung bases. Chronic left lower lobe calcified granuloma, and superimposed bilateral lower lobe scarring. No pericardial or pleural effusion. Hepatobiliary: Stable liver and gallbladder. Pancreas: Pancreas is stable and within normal limits. Spleen: Stable diminutive spleen. Adrenals/Urinary Tract: Normal adrenal glands. Bilateral renal enhancement and contrast excretion is symmetric and within normal limits. No hydronephrosis or hydroureter. The urinary bladder is mildly to moderately distended and the wall of the bladder does appear to be hyperenhancing (series 2, image 57). There is no definite perivesical inflammatory stranding. Stomach/Bowel: Stool ball in the rectum with superimposed rectal wall thickening, and mucosal hyperenhancement at the anal verge (series 2, image 69). Additional imaging through the entire perineum  was obtained, and there is no associated perineum subcutaneous cellulitis or inflammation. Rectal mucosal hyperenhancement also suspected. The distal sigmoid colon is decompressed but also appears indistinct. The proximal sigmoid colon has a more normal appearance. Retained stool resumes in the descending colon. Oral contrast has reached the mid transverse colon and is mixed with stool. The cecum is moderately distended with gas and to a lesser extent contrast, similar to the January CT. Decompressed distal small bowel. No dilated small bowel loops. The stomach and duodenum are within normal limits. No abdominal free air.  No abdominal free fluid identified. Vascular/Lymphatic: Aortoiliac calcified atherosclerosis. The major arterial structures in the abdomen and pelvis are patent. The portal venous system  is patent. Reproductive: Negative. Other: Small volume of pelvic free fluid and superimposed presacral soft tissue edema. Musculoskeletal: Chronic T12 compression fracture is stable. However, sclerosis of the right sacral ala is new since January compatible with a sacral insufficiency fracture. This is fairly nondisplaced. Bilateral anterior pubic rami fractures demonstrate interval callus formation, although there may be incomplete healing. IMPRESSION: 1. Perirectal and deep pelvic edema and small volume free fluid with inflammation from the anal verge to the distal sigmoid favored to reflect Acute Proctitis. Superimposed mild to moderate rectal fecal impaction. 2. There is also hyperenhancement of the wall of the urinary bladder. Consider a secondary bladder inflammation or infection. 3. Right sacral insufficiency fracture, new since January but appears subacute. 4. Healing pubic rami fractures.  Stable T12 compression fracture. Electronically Signed   By: Odessa Fleming M.D.   On: 08/20/2017 20:41     Subjective: Patient denies any abdominal pain.  Reports continued bowel movements.  Discharge Exam: Vitals:   08/24/17 0634 08/24/17 0814  BP: (!) 181/90   Pulse: 73   Resp: 18   Temp: 97.7 F (36.5 C)   SpO2: 100% 97%   Vitals:   08/23/17 1949 08/23/17 2204 08/24/17 0634 08/24/17 0814  BP:  (!) 157/78 (!) 181/90   Pulse: 71 70 73   Resp: Temp:  98.1 F (36.7 C) 97.7 F (36.5 C)   TempSrc:  Oral Oral   SpO2: 95% 97% 100% 97%  Weight:      Height:        General: Pt is alert, awake, not in acute distress Cardiovascular: RRR, S1/S2 +, no rubs, no gallops Respiratory: CTA bilaterally, no wheezing, no rhonchi Abdominal: Soft, NT, ND, bowel sounds + Extremities: no edema, no cyanosis    The results of significant diagnostics from this hospitalization (including imaging, microbiology, ancillary and laboratory) are listed below for reference.     Microbiology: Recent Results  (from the past 240 hour(s))  Culture, Urine     Status: Abnormal   Collection Time: 08/14/17  3:15 PM  Result Value Ref Range Status   Specimen Description   Final    URINE, CATHETERIZED Performed at [facility], [address], [place], [place] [address]    Special Requests   Final    NONE Performed at [facility], [address], [place], [place] [address]    Culture MULTIPLE SPECIES PRESENT, SUGGEST RECOLLECTION (A)  Final   Report Status 08/16/2017 FINAL  Final  Urine Culture     Status: Abnormal   Collection Time: 08/20/17  6:43 PM  Result Value Ref Range Status   Specimen Description   Final    URINE, CATHETERIZED Performed at [facility], [address], [place], [place] [address]    Special  Requests   Final    NONE Performed at [facility], [address], [place], [place] [address]    Culture >=100,000 COLONIES/mL PROTEUS MIRABILIS (A)  Final   Report Status 08/23/2017 FINAL  Final   Organism ID, Bacteria PROTEUS MIRABILIS (A)  Final      Susceptibility   Proteus mirabilis - MIC*    AMPICILLIN <=2 SENSITIVE Sensitive     CEFAZOLIN <=4 SENSITIVE Sensitive     CEFTRIAXONE <=1 SENSITIVE Sensitive     CIPROFLOXACIN <=0.25 SENSITIVE Sensitive     GENTAMICIN <=1 SENSITIVE Sensitive     IMIPENEM 8 INTERMEDIATE Intermediate     NITROFURANTOIN 128 RESISTANT Resistant     TRIMETH/SULFA <=20 SENSITIVE Sensitive     AMPICILLIN/SULBACTAM <=2 SENSITIVE Sensitive     PIP/TAZO <=4 SENSITIVE Sensitive     * >=100,000 COLONIES/mL PROTEUS MIRABILIS  MRSA PCR Screening     Status: None   Collection Time: 08/21/17 12:59 AM  Result Value Ref Range Status   MRSA by PCR NEGATIVE NEGATIVE Final    Comment:        The GeneXpert MRSA Assay (FDA approved for NASAL specimens only), is one component of a comprehensive MRSA colonization surveillance program. It is not intended to diagnose MRSA infection nor to guide or monitor treatment for MRSA infections. Performed at [facility], [address] [place], [place] [address]      Labs: BNP (last 3 results) No results for input(s): BNP in the last 8760 hours. Basic Metabolic Panel: Recent Labs  Lab 08/20/17 1807 08/21/17 0553  NA 135 139  K 3.7 4.0  CL 99* 107  CO2 29 26  GLUCOSE 101* 91  BUN 21* 14  CREATININE 0.82 0.62  CALCIUM 9.3 8.5*   Liver Function Tests: Recent Labs  Lab 08/20/17 1807 08/21/17 0553  AST 27 21  ALT 16 13*  ALKPHOS 89 67  BILITOT 1.0 0.8  PROT 8.4* 6.6  ALBUMIN 3.5 2.7*   Recent Labs  Lab 08/20/17 1909  LIPASE 80*   No results for input(s): AMMONIA in the last 168 hours. CBC: Recent Labs  Lab 08/20/17 1807 08/21/17 0553  WBC 10.4 8.3  NEUTROABS 6.4  --   HGB 13.4 11.4*  HCT 41.9 35.9*  MCV 90.1 90.4  PLT 325 299   Cardiac Enzymes: No results for input(s): CKTOTAL, CKMB, CKMBINDEX, TROPONINI in the last 168 hours. BNP: Invalid input(s): POCBNP CBG: No results for input(s): GLUCAP in the last 168 hours. D-Dimer No results for input(s): DDIMER in the last 72 hours. Hgb A1c No results for input(s): HGBA1C in the last 72 hours. Lipid Profile No results for input(s): CHOL, HDL, LDLCALC, TRIG, CHOLHDL, LDLDIRECT in the last 72 hours. Thyroid function studies No results for input(s): TSH, T4TOTAL, T3FREE, THYROIDAB in the last 72 hours.  Invalid input(s): FREET3 Anemia work up No results for input(s): VITAMINB12, FOLATE, FERRITIN, TIBC, IRON, RETICCTPCT in the last 72 hours. Urinalysis    Component Value Date/Time   COLORURINE YELLOW 08/20/2017 1758   APPEARANCEUR CLOUDY (A) 08/20/2017 1758   LABSPEC 1.012 08/20/2017 1758   PHURINE 7.0 08/20/2017 1758   GLUCOSEU NEGATIVE 08/20/2017 1758   HGBUR LARGE (A) 08/20/2017 1758   BILIRUBINUR NEGATIVE 08/20/2017 1758   KETONESUR NEGATIVE 08/20/2017 1758   PROTEINUR 100 (A) 08/20/2017 1758   UROBILINOGEN 0.2 11/20/2014 2023   NITRITE NEGATIVE 08/20/2017 1758   LEUKOCYTESUR LARGE (A) 08/20/2017 1758    Sepsis Labs Invalid input(s): PROCALCITONIN,  WBC,  LACTICIDVEN Microbiology Recent Results (from the past 240 hour(s))  Culture, Urine     Status: Abnormal   Collection Time: 08/14/17  3:15 PM  Result Value Ref Range Status   Specimen Description   Final    URINE, CATHETERIZED Performed at [facility], [address], [place], [place] [address]    Special Requests   Final    NONE Performed at [facility], [address] [place], [place] [address]    Culture MULTIPLE SPECIES PRESENT, SUGGEST RECOLLECTION (A)  Final   Report Status 08/16/2017 FINAL  Final  Urine Culture     Status: Abnormal   Collection Time: 08/20/17  6:43 PM  Result Value Ref Range Status   Specimen Description   Final    URINE, CATHETERIZED Performed at [facility], [address], [place], [place] [address]    Special Requests   Final    NONE Performed at [facility], [address], [place], [place] [address]    Culture >=100,000 COLONIES/mL PROTEUS MIRABILIS (A)  Final   Report Status 08/23/2017 FINAL  Final   Organism ID, Bacteria PROTEUS MIRABILIS (A)  Final      Susceptibility   Proteus mirabilis - MIC*    AMPICILLIN <=2 SENSITIVE Sensitive     CEFAZOLIN <=4 SENSITIVE Sensitive     CEFTRIAXONE <=1 SENSITIVE Sensitive     CIPROFLOXACIN <=0.25 SENSITIVE Sensitive     GENTAMICIN <=1 SENSITIVE Sensitive     IMIPENEM 8 INTERMEDIATE Intermediate     NITROFURANTOIN 128 RESISTANT Resistant     TRIMETH/SULFA <=20 SENSITIVE Sensitive     AMPICILLIN/SULBACTAM <=2 SENSITIVE Sensitive     PIP/TAZO <=4 SENSITIVE Sensitive     * >=100,000 COLONIES/mL PROTEUS MIRABILIS  MRSA PCR Screening     Status: None   Collection Time: 08/21/17 12:59 AM  Result Value Ref Range Status   MRSA by PCR NEGATIVE NEGATIVE Final    Comment:        The GeneXpert MRSA Assay (FDA approved for NASAL specimens only), is one component of a comprehensive MRSA colonization surveillance program. It is not intended to diagnose  MRSA infection nor to guide or monitor treatment for MRSA infections. Performed at [facility], [address], [place], [place] [address]      Time coordinating discharge:  SIGNED:   Erick Blinks, MD  [facility] 08/24/2017, 1:32 PM Pager   If 7PM-7AM, please contact night-coverage [email or web address] Password [email or web address]

## 2017-08-24 NOTE — NC FL2 (Signed)
[place] MEDICAID FL2 LEVEL OF CARE SCREENING TOOL     IDENTIFICATION  Patient Name: Sandra Davis Birthdate: 12-10-1928 Sex: female Admission Date (Current Location): 08/20/2017  [facility] and [place] Number:  [employer] and Address:  [facility],  [address] [place] [address]      Provider Number: [id number]  Attending Physician Name and Address:  Erick Blinks, MD  Relative Name and Phone Number:       Current Level of Care: Hospital Recommended Level of Care: Skilled Nursing Facility Prior Approval Number:    Date Approved/Denied:   PASRR Number:    Discharge Plan: SNF    Current Diagnoses: Patient Active Problem List   Diagnosis Date Noted  . Constipation   . UTI (urinary tract infection) 08/20/2017  . Proctitis 08/20/2017  . Recurrent falls 04/26/2017  . Dementia 04/26/2017  . COPD (chronic obstructive pulmonary disease) (HCC) 04/22/2017  . HTN (hypertension) 04/22/2017  . Parkinson's disease (HCC) 04/22/2017  . Glaucoma 04/22/2017  . Osteopenia 04/22/2017  . Bilateral fracture of pubic rami (HCC) 04/22/2017  . Traumatic closed nondisp fracture of greater tuberosity of humerus, left, initial encounter 12/08/2016  . Dizziness 10/13/2016    Orientation RESPIRATION BLADDER Height & Weight     Self  Normal Incontinent Weight: 92 lb 9.5 oz (42 kg) Height:   (147.3 cm)  BEHAVIORAL SYMPTOMS/MOOD NEUROLOGICAL BOWEL NUTRITION STATUS      Incontinent Diet  AMBULATORY STATUS COMMUNICATION OF NEEDS Skin   Total Care(wheelchair bound) Verbally Normal                       Personal Care Assistance Level of Assistance  Bathing, Feeding, Dressing Bathing Assistance: Maximum assistance Feeding assistance: Limited assistance Dressing Assistance: Maximum assistance     Functional Limitations Info             SPECIAL CARE FACTORS FREQUENCY  PT (By licensed PT)     PT Frequency: 5x/week               Contractures Contractures Info: Not present    Additional Factors Info  Code Status, Allergies, Psychotropic Code Status Info: DNR Allergies Info: Hydrochlorothiazide, Lisinopril, Other, Spironolactone Psychotropic Info: Ativan         Current Medications (08/24/2017):  This is the current hospital active medication list Current Facility-Administered Medications  Medication Dose Route Frequency Provider Last Rate Last Dose  . 0.9 %  sodium chloride infusion   Intravenous Continuous Meredeth Ide, MD 50 mL/hr at 08/23/17 1112    . acetaminophen (TYLENOL) tablet 650 mg  650 mg Oral Q6H PRN Meredeth Ide, MD       Or  . acetaminophen (TYLENOL) suppository 650 mg  650 mg Rectal Q6H PRN Sharl Ma, Sarina Ill, MD      . albuterol (PROVENTIL) (2.5 MG/3ML) 0.083% nebulizer solution 3 mL  3 mL Inhalation Q6H PRN Meredeth Ide, MD      . amoxicillin-clavulanate (AUGMENTIN) 875-125 MG per tablet 1 tablet  1 tablet Oral Q12H Erick Blinks, MD   1 tablet at 08/24/17 0945  . atenolol (TENORMIN) tablet 25 mg  25 mg Oral Daily Meredeth Ide, MD   25 mg at 08/24/17 0945  . docusate sodium (COLACE) capsule 100 mg  100 mg Oral BID Corbin Ade, MD   100 mg at 08/24/17 0945  . enoxaparin (LOVENOX) injection 30 mg  30 mg Subcutaneous Q24H Erick Blinks, MD  30 mg at 08/23/17 1855  . feeding supplement (BOOST / RESOURCE BREEZE) liquid 1 Container  1 Container Oral TID BM Erick Blinks, MD   1 Container at 08/24/17 0946  . feeding supplement (ENSURE ENLIVE) (ENSURE ENLIVE) liquid 237 mL  237 mL Oral TID BM Erick Blinks, MD   237 mL at 08/24/17 0947  . LORazepam (ATIVAN) tablet 0.25 mg  0.25 mg Oral BID PRN Meredeth Ide, MD   0.25 mg at 08/21/17 2326  . mometasone-formoterol (DULERA) 200-5 MCG/ACT inhaler 2 puff  2 puff Inhalation BID Meredeth Ide, MD   2 puff at 08/24/17 0813  . ondansetron (ZOFRAN) tablet 4 mg  4 mg Oral Q6H PRN Meredeth Ide, MD       Or  . ondansetron (ZOFRAN) injection 4 mg  4  mg Intravenous Q6H PRN Meredeth Ide, MD      . polyethylene glycol (MIRALAX / GLYCOLAX) packet 17 g  17 g Oral QID Corbin Ade, MD   17 g at 08/24/17 0945  . rotigotine (NEUPRO) 6 MG/24HR 1 patch  1 patch Transdermal Daily Lama, Sarina Ill, MD      . timolol (BETIMOL) 0.5 % ophthalmic solution 1 drop  1 drop Both Eyes BID Meredeth Ide, MD   1 drop at 08/24/17 1610     Discharge Medications: Please see discharge summary for a list of discharge medications.  Relevant Imaging Results:  Relevant Lab Results:   Additional Information SSN [id number] Juleen China, LCSW

## 2017-08-24 NOTE — Clinical Social Work Note (Signed)
Clinical Social Work Assessment  Patient Details  Name: Sandra Davis MRN: [id number] Date of Birth: 01-06-1929  Date of referral:  08/24/17               Reason for consult:  Facility Placement                Permission sought to share information with:    Permission granted to share information::     Name::        Agency::  [facility]  Relationship::     Contact Information:     Housing/Transportation Living arrangements for the past 2 months:  Skilled Building surveyor of Information:  Facility Patient Interpreter Needed:  None Criminal Activity/Legal Involvement Pertinent to Current Situation/Hospitalization:  No - Comment as needed Significant Relationships:  Adult Children, Spouse Lives with:  Facility Resident Do you feel safe going back to the place where you live?  Yes Need for family participation in patient care:  Yes (Comment)  Care giving concerns:  Facility resident.    Social Worker assessment / plan:  Patient is a long term resident at [facility]. She is wheelchair bound, incontinent and requires cues for feeding.  Patient can return to the facility at discharge.   Employment status:  Retired Health and safety inspector:  Armed forces operational officer, Medicaid [place] PT Recommendations:  Not assessed at this time Information / Referral to community resources:     Patient/Family's Response to care: Longterm resident at [facility].   Patient/Family's Understanding of and Emotional Response to Diagnosis, Current Treatment, and Prognosis:  Patient's family understands patient's diagnosis, treatment and prognosis and as a result desire for patient to go to SNF as a long term resident.   Emotional Assessment Appearance:  Appears stated age Attitude/Demeanor/Rapport:    Affect (typically observed):  Unable to Assess Orientation:  Oriented to Self Alcohol / Substance use:  Not Applicable Psych involvement (Current and /or in the community):  No (Comment)  Discharge Needs  Concerns to be  addressed:  No discharge needs identified Readmission within the last 30 days:  No Current discharge risk:  None Barriers to Discharge:  No Barriers Identified   Annice Needy, LCSW 08/24/2017, 11:15 AM

## 2017-08-25 ENCOUNTER — Encounter: Payer: Self-pay | Admitting: Internal Medicine

## 2017-08-25 NOTE — Progress Notes (Signed)
Location:   [facility] Room Number: 152/D Place of Service:  SNF (31) Provider:  Jeanine Luz, NP  Patient Care Team: Estevan Oaks, NP as PCP - General (Nurse Practitioner)  Extended Emergency Contact Information Primary Emergency Contact: Landfair,[name] Address: [address]          [place], [place] [address] [place] Home Phone: [phone] Relation: Spouse Secondary Emergency Contact: Privitera,[name] Address: [address]          [place], [place] [address] [place] Home Phone: [phone] Mobile Phone: [phone] Relation: Son  Code Status:  DNR Goals of care: Advanced Directive information Advanced Directives 08/25/2017  Does Patient Have a Medical Advance Directive? Yes  Type of Advance Directive Out of facility DNR (pink MOST or yellow form)  Does patient want to make changes to medical advance directive? No - Patient declined  Copy of Healthcare Power of Attorney in Chart? No - copy requested  Would patient like information on creating a medical advance directive? -  Pre-existing out of facility DNR order (yellow form or pink MOST form) -     Chief Complaint  Patient presents with  . Hospitalization Follow-up    Patients being seen for Hospitalization F/U    HPI:  Pt is a 84 y.o. female seen today for a hospital f/u s/p admission from  Past Medical History:  Diagnosis Date  . Arthritis    osteoporosis  . Back pain   . COPD (chronic obstructive pulmonary disease) (HCC)   . Dementia   . Gait instability   . Glaucoma   . Glaucoma   . Hypertension   . Neck pain   . Parkinson's disease (HCC)   . Tremor    Past Surgical History:  Procedure Laterality Date  . CATARACT EXTRACTION    . EYE SURGERY    . TONSILLECTOMY    . TUBAL LIGATION      Allergies  Allergen Reactions  . Hydrochlorothiazide Other (See Comments)    Reaction:  Dizziness   . Lisinopril Cough  . Other  Other (See Comments)    Pt states that she is allergic to multiple BP meds -- does not recall the names.   Reaction:  Dizziness   . Spironolactone Other (See Comments)    Reaction:  Blurred vision and sweating of feet     Outpatient Encounter Medications as of 08/25/2017  Medication Sig  . acetaminophen (TYLENOL) 325 MG tablet Take 650 mg by mouth every 6 (six) hours as needed.  Marland Kitchen albuterol (PROVENTIL HFA;VENTOLIN HFA) 108 (90 Base) MCG/ACT inhaler Inhale 1-2 puffs into the lungs every 6 (six) hours as needed for wheezing or shortness of breath.  Marland Kitchen aspirin EC 81 MG tablet Take 81 mg by mouth daily.  Marland Kitchen atenolol (TENORMIN) 50 MG tablet Take 25 mg by mouth daily.   . bisacodyl (DULCOLAX) 10 MG suppository Place 1 suppository (10 mg total) rectally as needed for moderate constipation.  . budesonide-formoterol (SYMBICORT) 160-4.5 MCG/ACT inhaler Inhale 2 puffs into the lungs 2 (two) times daily.  Marland Kitchen docusate sodium (COLACE) 100 MG capsule Take 1 capsule (100 mg total) by mouth 2 (two) times daily.  Marland Kitchen linaclotide (LINZESS) 145 MCG CAPS capsule Take 1 capsule (145 mcg total) by mouth daily before breakfast.  . loperamide (IMODIUM A-D) 2 MG tablet Take 2 mg by mouth 4 (four) times daily as needed for diarrhea or loose stools.  Marland Kitchen LORazepam (ATIVAN) 0.5  MG tablet Take 0.5 tablets (0.25 mg total) by mouth 2 (two) times daily as needed for anxiety.  . Melatonin 3 MG TABS Take 3 mg by mouth at bedtime.  . Menthol (ICY HOT ADVANCED RELIEF) 7.5 % PTCH Apply 1 patch topically every 12 (twelve) hours.  . nitroGLYCERIN (NITROSTAT) 0.4 MG SL tablet Place 1 tablet under the tongue as directed. Place 1 tablet under the tongue every five minutes as needed for emergency chest pain  . omeprazole (PRILOSEC) 40 MG capsule Take 40 mg by mouth daily as needed (for acid reflux).   . polyethylene glycol (MIRALAX / GLYCOLAX) packet Take 17 g by mouth daily as needed.  . Polyvinyl Alcohol-Povidone PF 1.4-0.6 % SOLN Apply 1  drop to eye 2 (two) times daily as needed.  . Probiotic Product (RISA-BID PROBIOTIC) TABS Take 1 tablet by mouth twice a day  . rotigotine (NEUPRO) 6 MG/24HR Place 1 patch onto the skin daily.  . timolol (BETIMOL) 0.5 % ophthalmic solution Place 1 drop into both eyes 2 (two) times daily.  . [DISCONTINUED] Menthol 7.5 % PTCH Apply 1 patch topically daily.   No facility-administered encounter medications on file as of 08/25/2017.      Review of Systems   There is no immunization history on file for this patient. Pertinent  Health Maintenance Due  Topic Date Due  . DEXA SCAN  09/17/2017 (Originally 01/02/1996)  . PNA vac Low Risk Adult (1 of 2 - PCV13) 09/17/2017 (Originally 01/02/1996)  . INFLUENZA VACCINE  11/11/2017   Fall Risk  08/05/2017 07/18/2015 03/19/2015 02/12/2015  Falls in the past year? Yes Yes No Yes  Number falls in past yr: 2 or more 1 - 2 or more  Injury with Fall? Yes No - Yes  Comment hip fracture - - left lower leg   Risk for fall due to : - (No Data) - -  Risk for fall due to: Comment - accidently hit by door that pushed her down - -   Functional Status Survey:    Vitals:   08/25/17 0957  BP: 108/60  Pulse: 63  Resp: 17  Temp: (!) 97.3 F (36.3 C)  TempSrc: Oral   There is no height or weight on file to calculate BMI. Physical Exam  Labs reviewed: Recent Labs    06/29/17 0742 06/29/17 1058 08/20/17 1807 08/21/17 0553  NA 134* 134* 135 139  K 3.8 3.7 3.7 4.0  CL 97* 98* 99* 107  CO2 GLUCOSE 144* 124* 101* 91  BUN 18 19 21* 14  CREATININE 0.58 0.63 0.82 0.62  CALCIUM 8.8* 8.7* 9.3 8.5*  MG 1.9  --   --   --    Recent Labs    06/29/17 1058 08/20/17 1807 08/21/17 0553  AST ALT 10* 16 13*  ALKPHOS 86 89 67  BILITOT 0.8 1.0 0.8  PROT 7.9 8.4* 6.6  ALBUMIN 3.1* 3.5 2.7*   Recent Labs    05/11/17 0709  06/29/17 1058 08/20/17 1807 08/21/17 0553  WBC 11.3*   < > 10.1 10.4 8.3  NEUTROABS 8.2*  --  7.1 6.4  --   HGB  11.0*   < > 12.3 13.4 11.4*  HCT 35.1*   < > 39.3 41.9 35.9*  MCV 96.2   < > 91.8 90.1 90.4  PLT 770*   < > 360 325 299   < > = values in this interval not displayed.  Lab Results  Component Value Date   TSH 1.419 06/16/2017   No results found for: HGBA1C No results found for: CHOL, HDL, LDLCALC, LDLDIRECT, TRIG, CHOLHDL  Significant Diagnostic Results in last 30 days:  No results found.  Assessment/Plan There are no diagnoses linked to this encounter.   Family/ staff Communication:   Labs/tests ordered:

## 2017-08-26 ENCOUNTER — Non-Acute Institutional Stay (SKILLED_NURSING_FACILITY): Payer: Medicare Other | Admitting: Internal Medicine

## 2017-08-26 ENCOUNTER — Encounter: Payer: Self-pay | Admitting: Internal Medicine

## 2017-08-26 DIAGNOSIS — K6289 Other specified diseases of anus and rectum: Secondary | ICD-10-CM

## 2017-08-26 DIAGNOSIS — G20 Parkinson's disease: Secondary | ICD-10-CM | POA: Diagnosis not present

## 2017-08-26 DIAGNOSIS — I10 Essential (primary) hypertension: Secondary | ICD-10-CM | POA: Diagnosis not present

## 2017-08-26 DIAGNOSIS — J410 Simple chronic bronchitis: Secondary | ICD-10-CM | POA: Diagnosis not present

## 2017-08-26 DIAGNOSIS — F0281 Dementia in other diseases classified elsewhere with behavioral disturbance: Secondary | ICD-10-CM | POA: Diagnosis not present

## 2017-08-26 NOTE — Progress Notes (Signed)
Provider: Einar Crow  Location:   [facility] Room Number: 152/D Place of Service:  SNF (31)  PCP: Estevan Oaks, NP Patient Care Team: Estevan Oaks, NP as PCP - General (Nurse Practitioner)  Extended Emergency Contact Information Primary Emergency Contact: Penza,[name] Address: [address]          [place], [place] [address] [place] Home Phone: [phone] Relation: Spouse Secondary Emergency Contact: Shipper,[name] Address: [address]          [place], [place] [address] [place] Home Phone: [phone] Mobile Phone: [phone] Relation: Son  Code Status: DNR Goals of Care: Advanced Directive information Advanced Directives 08/26/2017  Does Patient Have a Medical Advance Directive? Yes  Type of Advance Directive Out of facility DNR (pink MOST or yellow form)  Does patient want to make changes to medical advance directive? No - Patient declined  Copy of Healthcare Power of Attorney in Chart? No - copy requested  Would patient like information on creating a medical advance directive? -  Pre-existing out of facility DNR order (yellow form or pink MOST form) -      Chief Complaint  Patient presents with  . Readmit To SNF    Patient being seen for Readmission Visit    HPI: Patient is a 84 y.o. female seen today for admission to SNF for readmission and Long term Care. Patient has a history of hypertension, Parkinson disease with hallucinations, recurrent falls, COPD,Humerus fracture 09/18, Low back pain due to compression Fracture, Osteoporosis, Primary open angle Glaucoma Admitted to SNF after Closed Bilateral Fracture of Pubic Rami  Patient was send to the Hospital for Acute Abdominal Pain. Her Urine Culture was positive for Proteus and was treated with Antibiotics. She also had CT scan done which showed Fecal impaction and was seen by GI. She finally did get her bowels moving and her Pain  resolved. Patient is back in facility and is now on Linzess . She  Finished her antibiotics in hospital. Patient denies any complains today. Says her Abdominal pain is better. She also says itching in her Vaginal area is better. Her appetite is little better . No Fever, Nausea or Vomiting.  Past Medical History:  Diagnosis Date  . Arthritis    osteoporosis  . Back pain   . COPD (chronic obstructive pulmonary disease) (HCC)   . Dementia   . Gait instability   . Glaucoma   . Glaucoma   . Hypertension   . Neck pain   . Parkinson's disease (HCC)   . Tremor    Past Surgical History:  Procedure Laterality Date  . CATARACT EXTRACTION    . EYE SURGERY    . TONSILLECTOMY    . TUBAL LIGATION      reports that she has never smoked. She has never used smokeless tobacco. She reports that she does not drink alcohol or use drugs. Social History   Socioeconomic History  . Marital status: Married    Spouse name: Normand Sloop  . Number of children: 5  . Years of education: 3  . Highest education level: Not on file  Occupational History  . Not on file  Social Needs  . Financial resource strain: Not on file  . Food insecurity:    Worry: Not on file    Inability: Not on file  . Transportation needs:    Medical: Not on file    Non-medical: Not on file  Tobacco Use  . Smoking  status: Never Smoker  . Smokeless tobacco: Never Used  Substance and Sexual Activity  . Alcohol use: No  . Drug use: No  . Sexual activity: Not on file  Lifestyle  . Physical activity:    Days per week: Not on file    Minutes per session: Not on file  . Stress: Not on file  Relationships  . Social connections:    Talks on phone: Not on file    Gets together: Not on file    Attends religious service: Not on file    Active member of club or organization: Not on file    Attends meetings of clubs or organizations: Not on file    Relationship status: Not on file  . Intimate partner violence:    Fear of current  or ex partner: Not on file    Emotionally abused: Not on file    Physically abused: Not on file    Forced sexual activity: Not on file  Other Topics Concern  . Not on file  Social History Narrative   Lives at home with husband, family   Caffeine use- tea 2 cups daily, decaf green tea     Functional Status Survey:    Family History  Problem Relation Age of Onset  . Heart failure Mother     Health Maintenance  Topic Date Due  . DEXA SCAN  09/17/2017 (Originally 01[birth year]02/1996)  . TETANUS/TDAP  09/17/2017 (Originally 01/01/1948)  . PNA vac Low Risk Adult (1 of 2 - PCV13) 09/17/2017 (Originally 01[birth year]02/1996)  . INFLUENZA VACCINE  11/11/2017    Allergies  Allergen Reactions  . Hydrochlorothiazide Other (See Comments)    Reaction:  Dizziness   . Lisinopril Cough  . Other Other (See Comments)    Pt states that she is allergic to multiple BP meds -- does not recall the names.   Reaction:  Dizziness   . Spironolactone Other (See Comments)    Reaction:  Blurred vision and sweating of feet     Outpatient Encounter Medications as of 08/26/2017  Medication Sig  . acetaminophen (TYLENOL) 325 MG tablet Take 650 mg by mouth every 6 (six) hours as needed.  Marland Kitchen albuterol (PROVENTIL HFA;VENTOLIN HFA) 108 (90 Base) MCG/ACT inhaler Inhale 1-2 puffs into the lungs every 6 (six) hours as needed for wheezing or shortness of breath.  Marland Kitchen aspirin EC 81 MG tablet Take 81 mg by mouth daily.  Marland Kitchen atenolol (TENORMIN) 50 MG tablet Take 25 mg by mouth daily.   . bisacodyl (DULCOLAX) 10 MG suppository Place 1 suppository (10 mg total) rectally as needed for moderate constipation.  . budesonide-formoterol (SYMBICORT) 160-4.5 MCG/ACT inhaler Inhale 2 puffs into the lungs 2 (two) times daily.  Marland Kitchen docusate sodium (COLACE) 100 MG capsule Take 1 capsule (100 mg total) by mouth 2 (two) times daily.  Marland Kitchen linaclotide (LINZESS) 145 MCG CAPS capsule Take 1 capsule (145 mcg total) by mouth daily before breakfast.  . loperamide  (IMODIUM A-D) 2 MG tablet Take 2 mg by mouth 4 (four) times daily as needed for diarrhea or loose stools.  Marland Kitchen LORazepam (ATIVAN) 0.5 MG tablet Take 0.5 tablets (0.25 mg total) by mouth 2 (two) times daily as needed for anxiety.  . Melatonin 3 MG TABS Take 3 mg by mouth at bedtime.  . Menthol (ICY HOT ADVANCED RELIEF) 7.5 % PTCH Apply 1 patch topically every 12 (twelve) hours.  . nitroGLYCERIN (NITROSTAT) 0.4 MG SL tablet Place 1 tablet under the tongue as directed. Place 1  tablet under the tongue every five minutes as needed for emergency chest pain  . omeprazole (PRILOSEC) 40 MG capsule Take 40 mg by mouth daily as needed (for acid reflux).   . polyethylene glycol (MIRALAX / GLYCOLAX) packet Take 17 g by mouth daily as needed.  . Polyvinyl Alcohol-Povidone PF 1.4-0.6 % SOLN Apply 1 drop to eye 2 (two) times daily as needed.  . Probiotic Product (RISA-BID PROBIOTIC) TABS Take 1 tablet by mouth twice a day  . rotigotine (NEUPRO) 6 MG/24HR Place 1 patch onto the skin daily.  . timolol (BETIMOL) 0.5 % ophthalmic solution Place 1 drop into both eyes 2 (two) times daily.   No facility-administered encounter medications on file as of 08/26/2017.      Review of Systems  Review of Systems  Constitutional: Negative for activity change, appetite change, chills, diaphoresis, fatigue and fever.  HENT: Negative for mouth sores, postnasal drip, rhinorrhea, sinus pain and sore throat.   Respiratory: Negative for apnea, cough, chest tightness, shortness of breath and wheezing.   Cardiovascular: Negative for chest pain, palpitations and leg swelling.  Gastrointestinal: Negative for abdominal distention, abdominal pain, constipation, diarrhea, nausea and vomiting.  Genitourinary: Negative for dysuria and frequency.  Musculoskeletal: Negative for arthralgias, joint swelling and myalgias.  Skin: Negative for rash.  Neurological: Negative for dizziness, syncope, weakness, light-headedness and numbness.   Psychiatric/Behavioral: Negative for behavioral problems, confusion and sleep disturbance.    Vitals:   08/26/17 1245  BP: 101/60  Pulse: (!) 56  Resp: 18  Temp: 98.5 F (36.9 C)   There is no height or weight on file to calculate BMI. Physical Exam  Constitutional: She appears well-developed.  HENT:  Head: Normocephalic.  Mouth/Throat: Oropharynx is clear and moist.  Eyes: Pupils are equal, round, and reactive to light.  Neck: Normal range of motion. Neck supple.  Cardiovascular: Normal rate, regular rhythm and normal heart sounds.  Pulmonary/Chest: Breath sounds normal. No stridor. No respiratory distress. She has no rales.  Abdominal: Soft. Bowel sounds are normal. She exhibits no distension. There is no tenderness. There is no guarding.  Genitourinary:  Genitourinary Comments: No rash in Vaginal area  Musculoskeletal: She exhibits no edema.  Lymphadenopathy:    She has no cervical adenopathy.  Neurological: She is alert.  Follows some commands  Skin: Skin is warm and dry.  Psychiatric: She has a normal mood and affect. Her behavior is normal. Thought content normal.    Labs reviewed: Basic Metabolic Panel: Recent Labs    06/29/17 0742 06/29/17 1058 08/20/17 1807 08/21/17 0553  NA 134* 134* 135 139  K 3.8 3.7 3.7 4.0  CL 97* 98* 99* 107  CO2 GLUCOSE 144* 124* 101* 91  BUN 18 19 21* 14  CREATININE 0.58 0.63 0.82 0.62  CALCIUM 8.8* 8.7* 9.3 8.5*  MG 1.9  --   --   --    Liver Function Tests: Recent Labs    06/29/17 1058 08/20/17 1807 08/21/17 0553  AST ALT 10* 16 13*  ALKPHOS 86 89 67  BILITOT 0.8 1.0 0.8  PROT 7.9 8.4* 6.6  ALBUMIN 3.1* 3.5 2.7*   Recent Labs    06/29/17 1058 08/20/17 1909  LIPASE 47 80*   No results for input(s): AMMONIA in the last 8760 hours. CBC: Recent Labs    05/11/17 0709  06/29/17 1058 08/20/17 1807 08/21/17 0553  WBC 11.3*   < > 10.1 10.4 8.3  NEUTROABS 8.2*  --  7.1 6.4  --   HGB  11.0*   < > 12.3 13.4 11.4*  HCT 35.1*   < > 39.3 41.9 35.9*  MCV 96.2   < > 91.8 90.1 90.4  PLT 770*   < > 360 325 299   < > = values in this interval not displayed.   Cardiac Enzymes: Recent Labs    06/29/17 1058 06/29/17 1319 06/29/17 1634  TROPONINI <0.03 0.03* <0.03   BNP: Invalid input(s): POCBNP No results found for: HGBA1C Lab Results  Component Value Date   TSH 1.419 06/16/2017   No results found for: VITAMINB12 No results found for: FOLATE No results found for: IRON, TIBC, FERRITIN  Imaging and Procedures obtained prior to SNF admission: No results found.  Assessment/Plan  Abdominal Pian Most Likely due to fecal impaction On Linzess Continue to monitor. Will back off on Dose if has diarrhea. UTI Finished Antibiotics in hospital. Parkinson's disease On Neupro Patch Dementia with Behavior Issues Patient Continues to have decline and is now [employer] bound. Will get palliative care consult Her weight is stable right now at 86 lbs. On Melatonin and Ativan PRN.  COPD On ProAir air and Symbicort  Essential hypertension Discontinue Norvasc as BP low.  Stable in Metoprolol  GERD On Zantac    Family/ staff Communication:   Labs/tests ordered: Total time spent in this patient care encounter was 45_ minutes; greater than 50% of the visit spent counseling patient, reviewing records , Labs and coordinating care for problems addressed at this encounter.

## 2017-08-27 ENCOUNTER — Encounter (HOSPITAL_COMMUNITY)
Admission: RE | Admit: 2017-08-27 | Discharge: 2017-08-27 | Disposition: A | Discharge status: Home / Self Care | Payer: Medicare Other | Source: Skilled Nursing Facility | Attending: Internal Medicine | Admitting: Internal Medicine

## 2017-08-27 DIAGNOSIS — N390 Urinary tract infection, site not specified: Secondary | ICD-10-CM | POA: Diagnosis not present

## 2017-08-27 LAB — CBC
HEMATOCRIT: 37.5 % (ref 36.0–46.0)
HEMOGLOBIN: 12 g/dL (ref 12.0–15.0)
MCH: 28.2 pg (ref 26.0–34.0)
MCHC: 32 g/dL (ref 30.0–36.0)
MCV: 88.2 fL (ref 78.0–100.0)
Platelets: 323 10*3/uL (ref 150–400)
RBC: 4.25 MIL/uL (ref 3.87–5.11)
RDW: 17.1 % — ABNORMAL HIGH (ref 11.5–15.5)
WBC: 7.8 10*3/uL (ref 4.0–10.5)

## 2017-08-27 LAB — BASIC METABOLIC PANEL
ANION GAP: 7 (ref 5–15)
BUN: 13 mg/dL (ref 6–20)
CHLORIDE: 104 mmol/L (ref 101–111)
CO2: 25 mmol/L (ref 22–32)
CREATININE: 0.57 mg/dL (ref 0.44–1.00)
Calcium: 8.3 mg/dL — ABNORMAL LOW (ref 8.9–10.3)
GFR calc non Af Amer: >60 mL/min (ref >60)
GLUCOSE: 93 mg/dL (ref 65–99)
Potassium: 3.1 mmol/L — ABNORMAL LOW (ref 3.5–5.1)
Sodium: 136 mmol/L (ref 135–145)

## 2017-08-29 NOTE — Progress Notes (Signed)
This encounter was created in error - please disregard.

## 2017-09-01 ENCOUNTER — Encounter (HOSPITAL_COMMUNITY): Payer: Self-pay | Admitting: Primary Care

## 2017-09-01 DIAGNOSIS — Z0000 Encounter for general adult medical examination without abnormal findings: Secondary | ICD-10-CM

## 2017-09-01 DIAGNOSIS — Z515 Encounter for palliative care: Secondary | ICD-10-CM

## 2017-09-01 DIAGNOSIS — Z7189 Other specified counseling: Secondary | ICD-10-CM

## 2017-09-01 NOTE — Consult Note (Addendum)
Consultation Note Date: 09/01/2017   Patient Name: Sandra Davis  DOB: November 15, 1928  MRN: [id number]  Age / Sex: 84 y.o., female  PCP: Sandra Oaks, NP Referring Physician: No att. providers found  Reason for Consultation: Establishing goals of care  HPI/Patient Profile: 84 y.o. female  with past medical history of Parkinson's disease, dementia, urinary tract infection, constipation, anxiety, history of healing pubic rami fractures, right sacral insufficiency fracture new since January, Proteus Mirabilis UTI seen in [facility] residential SNF on 09/01/17 palliative goals of care discussion.   Clinical Assessment and Goals of Care: [name] is resting quietly in her wheelchair.  She will make an briefly keep eye contact.  She is able to tell me her name, but has known dementia.  She alternates between speaking [place] and [place].  I asked if she is having pain, she denies pain.  I ask if she would like something to eat or drink.  Initially she declines, then she agrees to eat some pudding.  There is no pudding so I provide ice cream, she declines to eat.  Call to son Sandra Davis at [phone].  Palliative medicine briefly discussed.  Sandra Davis states that he understands where his mother is in her chronic disease pathway.  He seems realistic about her recent declines.  He states that he feels knowledgeable related to his mother's chronic health problems.  We briefly reviewed labs from 5/17.  We talked about weight loss from 92 to 87 pounds.  Sandra Davis states that his mother has been eating less since her fall in January that required her to be a resident of SNF.  He states prior to the January she had a great appetite, but he sees her eating and drinking less so she does not rely on others to toilet.  I share that she would not eat ice cream for me today, Sandra Davis states that this is not unusual, at times she will take just a few bites and  tell family that she is full.  We talked about this as a normal expected decline related to her chronic health problems. We talked about a family meeting either face-to-face or via phone.  Rick elects to have face-to-face meeting, this is scheduled for Wednesday 5/29 at noon.  We discussed healthcare power of attorney.  Sandra Davis states that he lives in [place], both of his parents live with him.  He is the primary healthcare power of attorney followed by his Sister Sandra Davis who lives in [place].   We briefly talked about the benefits of hospice services in the facility at no cost.  I encouraged Sandra Davis to consider hospice services as an extra layer of support.  I share that we will discuss further options during our meeting. Sandra Davis denies concerns or questions at this time.  PMT phone number given encouraged to call as needed.  Family meeting with son/HC POA Sandra Davis on Wednesday 5/29 at noon.  Conference with speech therapy related to [name] clinical condition and decline over the past few months. Conference with nursing  staff related to clinical condition.  Conference with social work related to family dynamics.  Healthcare power of attorney HCPOA son Sandra Davis states that he and his sister Sandra Davis are healthcare power of attorney he first, [place] second.  No healthcare power of attorney paperwork in epic document list.  Son Sandra Davis is responsible party at residential SNF.   SUMMARY OF RECOMMENDATIONS   Family meeting with son/HC POA Sandra Davis on Wednesday 5/29 at noon. Continue to treat the treatable but no extraordinary measures.  Code Status/Advance Care Planning:  DNR -copy of DNR in epic document list.  Symptom Management:   By SNFist, no additional needs at this time.  Bowel regimen managed by gastroenterology.  Palliative Prophylaxis:   Bowel Regimen  Additional Recommendations (Limitations, Scope, Preferences):  Treat the treatable but no CPR, no  intubation.  Psycho-social/Spiritual:   Desire for further Chaplaincy support:no  Additional Recommendations: Caregiving  Support/Resources and Education on Hospice  Prognosis:   < 6 months, or less would not be surprising based on decreasing functional status, recent weight loss, albumin of 2.7, frailty.  Discharge Planning: Goal is to continue as resident of [facility]      Primary Diagnoses: Present on Admission: . UTI (urinary tract infection) . Dementia . Parkinson's disease (HCC)   I have reviewed the medical record, interviewed the patient and family, and examined the patient. The following aspects are pertinent.  Past Medical History:  Diagnosis Date  . Arthritis    osteoporosis  . Back pain   . COPD (chronic obstructive pulmonary disease) (HCC)   . Dementia   . Gait instability   . Glaucoma   . Glaucoma   . Hypertension   . Neck pain   . Parkinson's disease (HCC)   . Tremor    Social History   Socioeconomic History  . Marital status: Married    Spouse name: Sandra Davis  . Number of children: 5  . Years of education: 3  . Highest education level: Not on file  Occupational History  . Not on file  Social Needs  . Financial resource strain: Not on file  . Food insecurity:    Worry: Not on file    Inability: Not on file  . Transportation needs:    Medical: Not on file    Non-medical: Not on file  Tobacco Use  . Smoking status: Never Smoker  . Smokeless tobacco: Never Used  Substance and Sexual Activity  . Alcohol use: No  . Drug use: No  . Sexual activity: Not on file  Lifestyle  . Physical activity:    Days per week: Not on file    Minutes per session: Not on file  . Stress: Not on file  Relationships  . Social connections:    Talks on phone: Not on file    Gets together: Not on file    Attends religious service: Not on file    Active member of club or organization: Not on file    Attends meetings of clubs or organizations: Not on file     Relationship status: Not on file  Other Topics Concern  . Not on file  Social History Narrative   Lives at home with husband, family   Caffeine use- tea 2 cups daily, decaf green tea    Family History  Problem Relation Age of Onset  . Heart failure Mother    Scheduled Meds: Continuous Infusions: PRN Meds:. Medications Prior to Admission:  Prior to Admission medications   Medication Sig  Start Date End Date Taking? Authorizing Provider  acetaminophen (TYLENOL) 325 MG tablet Take 650 mg by mouth every 6 (six) hours as needed.   Yes [provider]  albuterol (PROVENTIL HFA;VENTOLIN HFA) 108 (90 Base) MCG/ACT inhaler Inhale 1-2 puffs into the lungs every 6 (six) hours as needed for wheezing or shortness of breath.   Yes [provider]  aspirin EC 81 MG tablet Take 81 mg by mouth daily.   Yes [provider]  atenolol (TENORMIN) 50 MG tablet Take 25 mg by mouth daily.    Yes [provider]  budesonide-formoterol (SYMBICORT) 160-4.5 MCG/ACT inhaler Inhale 2 puffs into the lungs 2 (two) times daily.   Yes [provider]  LORazepam (ATIVAN) 0.5 MG tablet Take 0.5 tablets (0.25 mg total) by mouth 2 (two) times daily as needed for anxiety. 08/18/17  Yes Lassen, Arlo C, PA-C  Melatonin 3 MG TABS Take 3 mg by mouth at bedtime.   Yes [provider]  nitroGLYCERIN (NITROSTAT) 0.4 MG SL tablet Place 1 tablet under the tongue as directed. Place 1 tablet under the tongue every five minutes as needed for emergency chest pain 11/16/12  Yes [provider]  omeprazole (PRILOSEC) 40 MG capsule Take 40 mg by mouth daily as needed (for acid reflux).    Yes [provider]  Polyvinyl Alcohol-Povidone PF 1.4-0.6 % SOLN Apply 1 drop to eye 2 (two) times daily as needed.   Yes [provider]  Probiotic Product (RISA-BID PROBIOTIC) TABS Take 1 tablet by mouth twice a day   Yes [provider]  rotigotine (NEUPRO) 6 MG/24HR  Place 1 patch onto the skin daily.   Yes [provider]  timolol (BETIMOL) 0.5 % ophthalmic solution Place 1 drop into both eyes 2 (two) times daily.   Yes [provider]  bisacodyl (DULCOLAX) 10 MG suppository Place 1 suppository (10 mg total) rectally as needed for moderate constipation. 08/24/17   Erick Blinks, MD  docusate sodium (COLACE) 100 MG capsule Take 1 capsule (100 mg total) by mouth 2 (two) times daily. 08/24/17   Erick Blinks, MD  linaclotide Karlene Einstein) 145 MCG CAPS capsule Take 1 capsule (145 mcg total) by mouth daily before breakfast. 08/25/17   Erick Blinks, MD  loperamide (IMODIUM A-D) 2 MG tablet Take 2 mg by mouth 4 (four) times daily as needed for diarrhea or loose stools.    [provider]  Menthol (ICY HOT ADVANCED RELIEF) 7.5 % PTCH Apply 1 patch topically every 12 (twelve) hours.    [provider]  polyethylene glycol (MIRALAX / GLYCOLAX) packet Take 17 g by mouth daily as needed. 08/24/17   Erick Blinks, MD   Allergies  Allergen Reactions  . Hydrochlorothiazide Other (See Comments)    Reaction:  Dizziness   . Lisinopril Cough  . Other Other (See Comments)    Pt states that she is allergic to multiple BP meds -- does not recall the names.   Reaction:  Dizziness   . Spironolactone Other (See Comments)    Reaction:  Blurred vision and sweating of feet    Review of Systems  Unable to perform ROS: Dementia    Physical Exam  Constitutional: She appears ill.  HENT:  Head: Atraumatic.  Pulmonary/Chest: Effort normal. No respiratory distress.  Abdominal: Soft.  Neurological: She is alert.  Known dementia  Skin: Skin is warm and dry.  Psychiatric:  Not fearful, but not overly cooperative  Nursing note and vitals reviewed.  Vital Signs: BP (!) 181/90 (BP Location: Left Arm)   Pulse 73   Temp 97.7 F (36.5 C) (Oral)   Resp 18   Ht  (1.473 m)   Wt 42 kg (92 lb 9.5 oz)   SpO2 97%   BMI 19.35 kg/m  Pain  Scale: 0-10   Pain Score: 0-No pain   SpO2: SpO2: 97 % O2 Device:SpO2: 97 % O2 Flow Rate: .   IO: Intake/output summary: No intake or output data in the 24 hours ending 09/01/17 1531  LBM: Last BM Date: 08/23/17 Baseline Weight: Weight: 41.7 kg (92 lb) Most recent weight: Weight: 42 kg (92 lb 9.5 oz)     Palliative Assessment/Data:   Flowsheet Rows     Most Recent Value  Intake Tab  Referral Department  -- [SNFist]  Unit at Time of Referral  Other (Comment) [SNF]  Palliative Care Primary Diagnosis  Neurology  Date Notified  08/31/17  Palliative Care Type  New Palliative care  Reason for referral  Clarify Goals of Care  Date first seen by Palliative Care  09/01/17  # of days Palliative referral response time  1 Day(s)  Clinical Assessment  Palliative Performance Scale Score  30%  Pain Max last 24 hours  Not able to report  Pain Min Last 24 hours  Not able to report  Dyspnea Max Last 24 Hours  Not able to report  Dyspnea Min Last 24 hours  Not able to report  Psychosocial & Spiritual Assessment  Palliative Care Outcomes  Patient/Family meeting held?  Yes  Who was at the meeting?  patient, son via phone  Patient/Family wishes: Interventions discontinued/not started   Mechanical Ventilation      Time In: 1430 Time Out: 1520 Time Total: 50 minutes NO CHARGE at this time.  Greater than 50%  of this time was spent counseling and coordinating care related to the above assessment and plan.  Signed by: Katheran Awe, NP   Please contact Palliative Medicine Team phone at [phone] for questions and concerns.  For individual provider: See Loretha Stapler

## 2017-09-09 NOTE — Progress Notes (Signed)
Daily Progress Note   Patient Name: Sandra Davis       Date: 09/09/2017 DOB: 1928/12/02  Age: 84 y.o. MRN#: [id number] Attending Physician: Virgie Dad, MD Primary Care Physician: Willene Hatchet, NP Admit Date: 08/24/2017  Reason for Consultation/Follow-up: Establishing goals of care  Subjective: Sandra Davis is sitting quietly in her wheelchair in the lunchroom waiting for her meal.  She will make but not keep eye contact, is calm and cooperative.  Sandra Davis has periods where she will communicate effectively, today seems to be one of those days.  She does take her time to process the questions and return an answer.  She is oriented to self only.  I ask if she is sleeping well, she says no.  When asked why, she states it is because too many people are moving in the hallway at night.  I ask if she is getting enough to eat, she states sometimes, when I ask if this is because of what is served she nods in affirmative. Conference with her regular nursing staff related to constitution, intake, mood. Meet with adult son/HC POA, Liliane Channel, in the entrance.  We go to my office for a family meeting.  I have reviewed medical records including EPIC notes, labs and imaging, received report from nursing staff, assessed the patient and then met with son/HC POA Rick to discuss diagnosis prognosis, GOC, EOL wishes, disposition and options.  I introduced Palliative Medicine as specialized medical care for people living with serious illness. It focuses on providing relief from the symptoms and stress of a serious illness. The goal is to improve quality of life for both the patient and the family.  We discussed a brief life review of the patient.  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  Liliane Channel states that until approximately 3 years ago his parents have lived independently.  Liliane Channel endorses that he had been seeing a change in his mother's ability to problem solve, communicate, prior to her moving in 3 years ago. He shares that he offered his home to them, they finally accepted.  He states that the family has seen a decline since January (after her fall and subsequent need for placement).  Liliane Channel states that he was hopeful his  father would have some improvements when relieved of caregiving burden, but Mr. Pester'  health has also declined (he is 6 years older).   As far as functional and nutritional status we talked about normal and expected changes in diet and nutrition including poor by mouth intake, dietary changes, aspiration.  We discussed their current illness and what it means in the larger context of their on-going co-morbidities.  Natural disease trajectory and expectations at EOL were discussed.  Liliane Channel talks about his parents faith, their life of ministry.  He states that what she wanted 15 years ago has changed.  The difference between aggressive medical intervention and comfort care was considered in light of the patient's goals of care.  We talked about "what is next".  I share that I would not be surprised for Sandra Davis develop another urinary tract infection, or have a devastating fall.    Advanced directives, concepts specific to code status, artifical feeding and hydration, and rehospitalization were considered and discussed.  Sandra Davis and her husband completed advance directives approximately 15 years ago, these were revised approximately 1 year ago.  Family states that this time, no PEG tube, unlikely that they would put her through surgery even if she would die without it.   I share a diagram of the chronic illness pathway, what is normal and expected.  We talked about dehydration and poor by mouth intake is a normal sequela of  advancing dementia.  We talked about coming into the hospital for rehydration, returning to the SNF to become dehydrated, how this is a vicious cycle.  We discussed the concepts of "let nature take its course".  I give an example of when this would be appropriate.  Liliane Channel is very appropriate, asking appropriate questions.  From my experience, Liliane Channel seems to clearly "see" his mother, her declines, her eventual death.  Hospice and Palliative Care services outpatient were explained and offered.  We talked in detail about the benefits of hospice in place.  Liliane Channel does have questions related to the continued use of her medications.  I encouraged him to talk with hospice of [facility] about what they can and cannot provide.  Questions and concerns were addressed.  Hard Choices and gone from my sight booklet provided.  MOST form reviewed in detail, but not completed.  We also talked about "Coach Broyles play book" which gives helpful information on the stages of dementia, communication, expected outcomes.  Liliane Channel was encouraged to call with questions or concerns, I share that I am willing to have a family meeting with his father and sisters if he feels the need.    Physical Exam  Constitutional: No distress.  Makes him briefly keeps eye contact, calm and cooperative  HENT:  Head: Atraumatic.  Cardiovascular: Normal rate.  Pulmonary/Chest: Effort normal. No respiratory distress.  Abdominal: Soft. She exhibits no distension.  Musculoskeletal: She exhibits no edema.  Appears frail, wheelchair-bound  Neurological: She is alert.  Oriented to self only, known dementia  Skin: Skin is warm and dry.  Psychiatric:  Not fearful  Nursing note and vitals reviewed.           Vital Signs: There were no vitals taken for this visit. SpO2:   O2 Device:   O2 Flow Rate:    Intake/output summary: No intake or output data in the 24 hours ending 09/09/17 1547 LBM:   Baseline Weight:   Most recent weight:          Palliative Assessment/Data:  Patient Active Problem List   Diagnosis Date Noted  . Palliative care by specialist   . Goals of care, counseling/discussion   . Constipation   . UTI (urinary tract infection) 08/20/2017  . Proctitis 08/20/2017  . Recurrent falls 04/26/2017  . Dementia 04/26/2017  . COPD (chronic obstructive pulmonary disease) ([place]) 04/22/2017  . HTN (hypertension) 04/22/2017  . Parkinson's disease ([place]) 04/22/2017  . Glaucoma 04/22/2017  . Osteopenia 04/22/2017  . Bilateral fracture of pubic rami ([place]) 04/22/2017  . Traumatic closed nondisp fracture of greater tuberosity of humerus, left, initial encounter 12/08/2016  . Dizziness 10/13/2016    Palliative Care Assessment & Plan   Patient Profile: 84 y.o. female  with past medical history of Parkinson's disease, dementia, urinary tract infection, constipation, anxiety, history of healing pubic rami fractures, right sacral insufficiency fracture new since January, Proteus Mirabilis UTI seen in [facility] residential SNF on 09/01/17 palliative goals of care discussion.   Assessment: Dementia; Frailty;  Recommendations/Plan: Continue to treat the treatable but no extraordinary measures such as CPR or intubation.  No PEG tube.  Per family, likely no surgery.  Goals of Care and Additional Recommendations:  Limitations on Scope of Treatment: At this point, continue to treat the treatable but no extraordinary measures.  Code Status: Code Status History    Date Active Date Inactive Code Status Order ID Comments User Context   08/20/2017 2338 08/24/2017 1834 DNR [id number]  Oswald Hillock, MD Inpatient   06/29/2017 1022 06/29/2017 2348 DNR [id number]  Fredia Sorrow, MD ED   04/22/2017 2229 04/23/2017 2009 DNR [id number]  Bethena Roys, MD Inpatient    Questions for Most Recent Historical Code Status (Order [id number])    Question Answer Comment   In the event of cardiac or respiratory ARREST Do not call a "code  blue"    In the event of cardiac or respiratory ARREST Do not perform Intubation, CPR, defibrillation or ACLS    In the event of cardiac or respiratory ARREST Use medication by any route, position, wound care, and other measures to relive pain and suffering. May use oxygen, suction and manual treatment of airway obstruction as needed for comfort.        Prognosis:   Unable to determine, 6 months or less would not be surprising.  2 months or less would not be surprising if Mrs. Romanello develops another infection, has an acute fall with injury.  Discharge Planning:  Family states Mrs. Peth will remain resident of [facility].  Care plan was discussed with nursing staff, Oscar La PA.   Thank you for allowing the Palliative Medicine Team to assist in the care of this patient.   Time In: 1200 Time Out: 1430 Total Time 150 minutes Prolonged Time Billed  yes       Greater than 50%  of this time was spent counseling and coordinating care related to the above assessment and plan.  Drue Novel, NP  Please contact Palliative Medicine Team phone at [phone] for questions and concerns.

## 2017-09-10 ENCOUNTER — Other Ambulatory Visit: Payer: Self-pay

## 2017-09-10 MED ORDER — LORAZEPAM 0.5 MG PO TABS: 0.2500 mg | ORAL_TABLET | Freq: Two times a day (BID) | ORAL | 0 refills | Status: DC | PRN

## 2017-09-10 NOTE — Telephone Encounter (Signed)
RX Fax for [facility]@ [phone]  

## 2017-09-16 ENCOUNTER — Encounter: Payer: Self-pay | Admitting: Internal Medicine

## 2017-09-16 ENCOUNTER — Non-Acute Institutional Stay (SKILLED_NURSING_FACILITY): Payer: Medicare Other | Admitting: Internal Medicine

## 2017-09-16 DIAGNOSIS — G20 Parkinson's disease: Secondary | ICD-10-CM | POA: Diagnosis not present

## 2017-09-16 DIAGNOSIS — F419 Anxiety disorder, unspecified: Secondary | ICD-10-CM | POA: Diagnosis not present

## 2017-09-16 DIAGNOSIS — J410 Simple chronic bronchitis: Secondary | ICD-10-CM | POA: Diagnosis not present

## 2017-09-16 DIAGNOSIS — K5900 Constipation, unspecified: Secondary | ICD-10-CM | POA: Diagnosis not present

## 2017-09-16 DIAGNOSIS — I10 Essential (primary) hypertension: Secondary | ICD-10-CM | POA: Diagnosis not present

## 2017-09-16 DIAGNOSIS — F0281 Dementia in other diseases classified elsewhere with behavioral disturbance: Secondary | ICD-10-CM

## 2017-09-16 NOTE — Progress Notes (Signed)
This is an acute visit.  Level care is skilled.  Facility is [facility].  Chief complaint-acute visit follow-up dementia  with agitation-anxiety.  History of present illness.  Patient is an 84 year old female who nursing staff feels has had some recent increased agitation and anxiety  She has a history of hypertension as well as Parkinson's disease with hallucinations-recurrent falls--dementia--COPD as well as a humerus fracture in September 2018- also low back pain because of compression fracture as well as osteoporosis and primary open angle glaucoma  Most recent acute issue was approximately a month ago abdominal pain urine culture was positive for Proteus she was treated with antibiotics. She was also found to have a fecal impaction this Resolved and she is back in facility  she is now on Colace twice a day  As well as Linzess and MiraLAx   staff has noted some increased agitation and anxiety--she does have an order for Ativan 0.25 mg as needed but nursing staff feels she would benefit from more routine dosing    Past Medical History:  Diagnosis Date  . Arthritis    osteoporosis  . Back pain   . COPD (chronic obstructive pulmonary disease) (HCC)   . Dementia   . Gait instability   . Glaucoma   . Glaucoma   . Hypertension   . Neck pain   . Parkinson's disease (HCC)   . Tremor         Past Surgical History:  Procedure Laterality Date  . CATARACT EXTRACTION    . EYE SURGERY    . TONSILLECTOMY    . TUBAL LIGATION      reports that she has never smoked. She has never used smokeless tobacco. She reports that she does not drink alcohol or use drugs. Social History        Socioeconomic History  . Marital status: Married    Spouse name: Normand Sloop  . Number of children: 5  . Years of education: 3  . Highest education level: Not on file  Occupational History  . Not on file  Social Needs  . Financial resource strain: Not on file  .  Food insecurity:    Worry: Not on file    Inability: Not on file  . Transportation needs:    Medical: Not on file    Non-medical: Not on file  Tobacco Use  . Smoking status: Never Smoker  . Smokeless tobacco: Never Used  Substance and Sexual Activity  . Alcohol use: No  . Drug use: No  . Sexual activity: Not on file  Lifestyle  . Physical activity:    Days per week: Not on file    Minutes per session: Not on file  . Stress: Not on file  Relationships  . Social connections:    Talks on phone: Not on file    Gets together: Not on file    Attends religious service: Not on file    Active member of club or organization: Not on file    Attends meetings of clubs or organizations: Not on file    Relationship status: Not on file  . Intimate partner violence:    Fear of current or ex partner: Not on file    Emotionally abused: Not on file    Physically abused: Not on file    Forced sexual activity: Not on file  Other Topics Concern  . Not on file  Social History Narrative   Lives at home with husband, family   Caffeine  use- tea 2 cups daily, decaf green tea     Functional Status Survey:  Family History  Problem Relation Age of Onset  . Heart failure Mother         Health Maintenance  Topic Date Due  . DEXA SCAN  09/17/2017 (Originally [birth year]02/1996)  . TETANUS/TDAP  09/17/2017 (Originally 01/01/1948)  . PNA vac Low Risk Adult (1 of 2 - PCV13) 09/17/2017 (Originally [birth year]02/1996)  . INFLUENZA VACCINE  11/11/2017         Allergies  Allergen Reactions  . Hydrochlorothiazide Other (See Comments)    Reaction:  Dizziness   . Lisinopril Cough  . Other Other (See Comments)    Pt states that she is allergic to multiple BP meds -- does not recall the names.   Reaction:  Dizziness   . Spironolactone Other (See Comments)    Reaction:  Blurred vision and sweating of feet       MEDICATIONS     Medication Sig  . acetaminophen  (TYLENOL) 325 MG tablet Take 650 mg by mouth every 6 (six) hours as needed.  Marland Kitchen albuterol (PROVENTIL HFA;VENTOLIN HFA) 108 (90 Base) MCG/ACT inhaler Inhale 1-2 puffs into the lungs every 6 (six) hours as needed for wheezing or shortness of breath.  Marland Kitchen aspirin EC 81 MG tablet Take 81 mg by mouth daily.  Marland Kitchen atenolol (TENORMIN) 50 MG tablet Take 25 mg by mouth daily.   . bisacodyl (DULCOLAX) 10 MG suppository Place 1 suppository (10 mg total) rectally as needed for moderate constipation.  . budesonide-formoterol (SYMBICORT) 160-4.5 MCG/ACT inhaler Inhale 2 puffs into the lungs 2 (two) times daily.  Marland Kitchen docusate sodium (COLACE) 100 MG capsule Take 1 capsule (100 mg total) by mouth 2 (two) times daily.  Marland Kitchen linaclotide (LINZESS) 145 MCG CAPS capsule Take 1 capsule (145 mcg total) by mouth daily before breakfast.  . loperamide (IMODIUM A-D) 2 MG tablet Take 2 mg by mouth 4 (four) times daily as needed for diarrhea or loose stools.  Marland Kitchen LORazepam (ATIVAN) 0.5 MG tablet Take 0.5 tablets (0.25 mg total) by mouth 2 (two) times daily   . Melatonin 3 MG TABS Take 3 mg by mouth at bedtime.  . Menthol (ICY HOT ADVANCED RELIEF) 7.5 % PTCH Apply 1 patch topically every 12 (twelve) hours.  . nitroGLYCERIN (NITROSTAT) 0.4 MG SL tablet Place 1 tablet under the tongue as directed. Place 1 tablet under the tongue every five minutes as needed for emergency chest pain  . omeprazole (PRILOSEC) 40 MG capsule Take 40 mg by mouth daily as needed (for acid reflux).   . polyethylene glycol (MIRALAX / GLYCOLAX) packet Take 17 g by mouth daily as needed.  . Polyvinyl Alcohol-Povidone PF 1.4-0.6 % SOLN Apply 1 drop to eye 2 (two) times daily as needed.  . Probiotic Product (RISA-BID PROBIOTIC) TABS Take 1 tablet by mouth twice a day  . rotigotine (NEUPRO) 6 MG/24HR Place 1 patch onto the skin daily.  . timolol (BETIMOL) 0.5 % ophthalmic solution Place 1 drop into both eyes 2 (two) times daily.    Review of systems. This is limited  secondary to dementia which barriers with patient speaking Spanish- a nursing tech who speaks Spanish did help.  In general she says she is not having any pain or discomfort and does not have any complaints.  Nursing staff has noted some anxiety which is fairly persistent.  She currently is not really expressing any complaints  She denies abdominal discomfort   .  Physical exam. She is afebrile pulse is 80 respirations of 19 blood pressure taken manually 140/90.  In general this is a frail elderly female in no distress sitting in her wheelchair she does have upper extremity tremors.  Skin is warm and dry.  Eyes she has prescription lenses visual acuity appears grossly intact she does make eye contact at times.  Oropharynx is clear mucous membranes moist  Chest poor respiratory effort but could not appreciate any labored breathing or overt congestion.  Heart is regular rate and rhythm without murmur gallop or rub she does not have significant lower extremity edema.  Abdomen is soft slightly protuberant at baseline with active bowel sounds it is nontender.  Musculoskeletal a #Generalized frailty is able to move her extremities but has significant arthritic changes especially lower extremities.  Neurologic--speaks in Spanish- she is alert appears somewhat nervous appearing does have upper extremity tremors right greater than left  Psych- again appears a bit anxious -- is alert at times will follow simple verbal commands   Labs.  Aug 27, 2017.  Sodium 136 potassium 3.1 BUN 13 creatinine 0.57.  WBC 7.8 hemoglobin 12.0 platelets 323.  Aug 21, 2017.  Albumin 2.7 otherwise liver function tests within normal limits.  Assessment and plan.  1.  Abdominal pain this was thought likely to fecal impaction this apparently has improved on current medications including Linzess Colace and MiraLAX.  2.  Parkinson's disease continues on Neuro pro patch--at this point continue  supportive care.  3.  Dementia with behaviors- she has had some gradual decline- weight is relatively stable at 83.2 pounds there has been some mild weight loss.-she is on supplements--now 3x a day  She has had some persistent anxiety will make Ativan routine 0.25 mg twice daily and DC the PRN Ativan.  She is also on melatonin to help her sleep at night.  4.  COPD this appears relatively stable on pro-air and Symbicort.  5.  Hypertension she continues on atenolol--Norvasc was discontinued because of hypotension concerns--blood pressures appear somewhat variable do not see consistent highs or lows manual blood pressure today 140/90- previous readings to16 5/76- 90/60- 125/62- will monitor--suspect most of those readings were done by machine   #6 history of GERD she continues on Zantac.  7.  Hypokalemia recent labs showed a potassium of 3.1- will update this tomorrow for accuracy this was discussed with Dr. Chales AbrahamsGupta.  CPT- [id number]

## 2017-09-17 ENCOUNTER — Encounter (HOSPITAL_COMMUNITY)
Admission: RE | Admit: 2017-09-17 | Discharge: 2017-09-17 | Disposition: A | Discharge status: Home / Self Care | Payer: Medicare Other | Source: Skilled Nursing Facility | Attending: Internal Medicine | Admitting: Internal Medicine

## 2017-09-17 DIAGNOSIS — Z4789 Encounter for other orthopedic aftercare: Secondary | ICD-10-CM | POA: Insufficient documentation

## 2017-09-17 DIAGNOSIS — N390 Urinary tract infection, site not specified: Secondary | ICD-10-CM | POA: Insufficient documentation

## 2017-09-17 DIAGNOSIS — S3282XD Multiple fractures of pelvis without disruption of pelvic ring, subsequent encounter for fracture with routine healing: Secondary | ICD-10-CM | POA: Insufficient documentation

## 2017-09-17 LAB — BASIC METABOLIC PANEL
ANION GAP: 5 (ref 5–15)
BUN: 14 mg/dL (ref 6–20)
CO2: 27 mmol/L (ref 22–32)
Calcium: 9.1 mg/dL (ref 8.9–10.3)
Chloride: 105 mmol/L (ref 101–111)
Creatinine, Ser: 0.55 mg/dL (ref 0.44–1.00)
Glucose, Bld: 99 mg/dL (ref 65–99)
POTASSIUM: 3.9 mmol/L (ref 3.5–5.1)
SODIUM: 137 mmol/L (ref 135–145)

## 2017-09-21 ENCOUNTER — Non-Acute Institutional Stay (SKILLED_NURSING_FACILITY): Payer: Medicare Other | Admitting: Internal Medicine

## 2017-09-21 ENCOUNTER — Encounter: Payer: Self-pay | Admitting: Internal Medicine

## 2017-09-21 DIAGNOSIS — L251 Unspecified contact dermatitis due to drugs in contact with skin: Secondary | ICD-10-CM

## 2017-09-21 DIAGNOSIS — E876 Hypokalemia: Secondary | ICD-10-CM | POA: Diagnosis not present

## 2017-09-21 DIAGNOSIS — F419 Anxiety disorder, unspecified: Secondary | ICD-10-CM

## 2017-09-21 DIAGNOSIS — G20 Parkinson's disease: Secondary | ICD-10-CM

## 2017-09-21 NOTE — Progress Notes (Signed)
Location:   [facility] Room Number: 152/D Place of Service:  SNF [phone](31) Provider:  Jeanine LuzArlo Sergio Davis  Sandra Davis, Sandra Ann, NP  Patient Care Team: [name], Sandra Ann, NP as PCP - General (Nurse Practitioner)  Extended Emergency Contact Information Primary Emergency Contact: Hochman,[name] Address: [address]          [place], [place] [id number] [place] Home Phone: [phone] Relation: Spouse Secondary Emergency Contact: Tessmer,[name] Address: [address]          [place], [place] [address] [place] Home Phone: [phone] Mobile Phone: [phone] Relation: Son  Code Status:  DNR Goals of care: Advanced Directive information Advanced Directives 09/21/2017  Does Patient Have a Medical Advance Directive? Yes  Type of Advance Directive Out of facility DNR (pink MOST or yellow form)  Does patient want to make changes to medical advance directive? No - Patient declined  Copy of Healthcare Power of Attorney in Chart? No - copy requested  Would patient like information on creating a medical advance directive? -  Pre-existing out of facility DNR order (yellow form or pink MOST form) -     Chief Complaint  Patient presents with  . Acute Visit    Patient being seen for irritation @ patch site   Also follow-up of anxiety   HPI:  Pt is a 84 y.o. female seen today for an acute visit for apparent irritation at her neuro pro patch.  Patient is a relatively long-term resident of facility with a history of Parkinson's disease with hallucinations COPD-- compression fractures with osteoporosis she also has a history of UTIs and fecal impaction.  Saw patient recently for some increased agitation and anxiety start routine Ativan twice daily -- previously had been receiving it PRN apparently requiring it frequently  Per nursing staff she continues to have some agitation and feels she would tolerate a slightly higher dose there is been no  evidence of sedation or respiratory depression.  Staff also has noted some irritation where her neupro patch has been placed on her arms.  She is on this for Parkinson's disease.  apparently this is developed over the last few days.  Patient is a poor historian secondary to dementia but she does not appear to be uncomfortable or complaining of acute issues today    Past Medical History:  Diagnosis Date  . Arthritis    osteoporosis  . Back pain   . COPD (chronic obstructive pulmonary disease) (HCC)   . Dementia   . Gait instability   . Glaucoma   . Glaucoma   . Hypertension   . Neck pain   . Parkinson's disease (HCC)   . Tremor    Past Surgical History:  Procedure Laterality Date  . CATARACT EXTRACTION    . EYE SURGERY    . TONSILLECTOMY    . TUBAL LIGATION      Allergies  Allergen Reactions  . Hydrochlorothiazide Other (See Comments)    Reaction:  Dizziness   . Lisinopril Cough  . Other Other (See Comments)    Pt states that she is allergic to multiple BP meds -- does not recall the names.   Reaction:  Dizziness   . Spironolactone Other (See Comments)    Reaction:  Blurred vision and sweating of feet     Outpatient Encounter Medications as of 09/21/2017  Medication Sig  . acetaminophen (TYLENOL) 325 MG tablet Take 650 mg by mouth every 6 (six) hours as needed.  .Marland Kitchen  albuterol (PROVENTIL HFA;VENTOLIN HFA) 108 (90 Base) MCG/ACT inhaler Inhale 1-2 puffs into the lungs every 6 (six) hours as needed for wheezing or shortness of breath.  Marland Kitchen aspirin EC 81 MG tablet Take 81 mg by mouth daily.  Marland Kitchen atenolol (TENORMIN) 50 MG tablet Take 25 mg by mouth daily.   . bisacodyl (DULCOLAX) 10 MG suppository Place 1 suppository (10 mg total) rectally as needed for moderate constipation.  . budesonide-formoterol (SYMBICORT) 160-4.5 MCG/ACT inhaler Inhale 2 puffs into the lungs 2 (two) times daily.  Marland Kitchen docusate sodium (COLACE) 100 MG capsule Take 1 capsule (100 mg total) by mouth 2 (two)  times daily.  Marland Kitchen linaclotide (LINZESS) 145 MCG CAPS capsule Take 1 capsule (145 mcg total) by mouth daily before breakfast.  . LORazepam (ATIVAN) 0.5 MG tablet Take 0.5 mg by mouth 2 (two) times daily.  . Melatonin 3 MG TABS Take 3 mg by mouth at bedtime.  . Menthol (ICY HOT ADVANCED RELIEF) 7.5 % PTCH Apply 1 patch topically every 12 (twelve) hours.  . nitroGLYCERIN (NITROSTAT) 0.4 MG SL tablet Place 1 tablet under the tongue as directed. Place 1 tablet under the tongue every five minutes as needed for emergency chest pain  . polyethylene glycol (MIRALAX / GLYCOLAX) packet Take 17 g by mouth daily as needed.  . Polyvinyl Alcohol-Povidone PF 1.4-0.6 % SOLN Apply 1 drop to eye 2 (two) times daily as needed.  . ranitidine (ZANTAC) 150 MG tablet Take 150 mg by mouth daily.  . rotigotine (NEUPRO) 6 MG/24HR Place 1 patch onto the skin daily.  . timolol (BETIMOL) 0.5 % ophthalmic solution Place 1 drop into both eyes 2 (two) times daily.  . [DISCONTINUED] loperamide (IMODIUM A-D) 2 MG tablet Take 2 mg by mouth 4 (four) times daily as needed for diarrhea or loose stools.  . [DISCONTINUED] LORazepam (ATIVAN) 0.5 MG tablet Take 0.5 tablets (0.25 mg total) by mouth 2 (two) times daily as needed for anxiety.  . [DISCONTINUED] omeprazole (PRILOSEC) 40 MG capsule Take 40 mg by mouth daily as needed (for acid reflux).   . [DISCONTINUED] Probiotic Product (RISA-BID PROBIOTIC) TABS Take 1 tablet by mouth twice a day   No facility-administered encounter medications on file as of 09/21/2017.     Review of Systems   Continues to be limited secondary to dementia.  In general there really no complaints of pain at this time.  Skin she does have the patches on her arms as noted above   e head ears eyes nose mouth and throat has prescription lenses nursing is not noted any visual changes  Respiratory no increased cough or shortness of breath complaints noted.  Cardiac no chest pain noted or significant  edema  GI no complaints of abdominal pain she does have some history of constipation apparently this is improved on the Linzess and Colace.  Musculoskeletal does not appear to complain of joint pain today.  Neurologic staff has not noted complaints of headache or dizziness- she does have a history of Parkinson's occasional tremors.  Psych  she does have anxiety as noted above    There is no immunization history on file for this patient. Pertinent  Health Maintenance Due  Topic Date Due  . DEXA SCAN  10/21/2017 (Originally 01/02/1996)  . PNA vac Low Risk Adult (1 of 2 - PCV13) 10/21/2017 (Originally 01/02/1996)  . INFLUENZA VACCINE  11/11/2017   Fall Risk  08/05/2017 07/18/2015 03/19/2015 02/12/2015  Falls in the past year? Yes Yes No Yes  Number falls in past yr: 2 or more 1 - 2 or more  Injury with Fall? Yes No - Yes  Comment hip fracture - - left lower leg   Risk for fall due to : - (No Data) - -  Risk for fall due to: Comment - accidently hit by door that pushed her down - -   Functional Status Survey:    Vitals:   09/21/17 1440  BP: 118/74  Pulse: 76  Resp: 18  Temp: 98 F (36.7 C)  TempSrc: Oral    Physical Exam   In general this is a frail elderly female in no distress sitting comfortably in her wheelchair she appears a bit anxious but not uncomfortable.  Her skin is warm and dry.--On both upper arms there is a well defined erythematous area where the patches had been applied-this does not appear cellulitic appears to be more of a reaction type erythema  She has prescription lenses visual acuity appears to be intact.  Oropharynx is clear mucous membranes moist.  Chest is clear to auscultation with somewhat shallow air entry there is no labored breathing.  Heart is regular rate and rhythm without murmur gallop or rub.  She does not have significant lower extremity edema.  Abdomen is soft nontender with positive bowel sounds.  Musculoskeletal has generalized  frailty but moves her extremities at baseline diffuse arthritic changes  Neurologic grossly intact she does have a slight tremor intermittent of her upper extremities  Psych-does have a mild anxious appearance but is following simple verbal commands today   Labs reviewed: Recent Labs    06/29/17 0742  08/21/17 0553 08/27/17 0725 09/17/17 0740  NA 134*   < > 139 136 137  K 3.8   < > 4.0 3.1* 3.9  CL 97*   < > 107 104 105  CO2 24   < > 26 25 27   GLUCOSE 144*   < > 91 93 99  BUN 18   < > 14 13 14   CREATININE 0.58   < > 0.62 0.57 0.55  CALCIUM 8.8*   < > 8.5* 8.3* 9.1  MG 1.9  --   --   --   --    < > = values in this interval not displayed.   Recent Labs    06/29/17 1058 08/20/17 1807 08/21/17 0553  AST 20 27 21   ALT 10* 16 13*  ALKPHOS 86 89 67  BILITOT 0.8 1.0 0.8  PROT 7.9 8.4* 6.6  ALBUMIN 3.1* 3.5 2.7*   Recent Labs    05/11/17 0709  06/29/17 1058 08/20/17 1807 08/21/17 0553 08/27/17 0725  WBC 11.3*   < > 10.1 10.4 8.3 7.8  NEUTROABS 8.2*  --  7.1 6.4  --   --   HGB 11.0*   < > 12.3 13.4 11.4* 12.0  HCT 35.1*   < > 39.3 41.9 35.9* 37.5  MCV 96.2   < > 91.8 90.1 90.4 88.2  PLT 770*   < > 360 325 299 323   < > = values in this interval not displayed.   Lab Results  Component Value Date   TSH 1.419 06/16/2017   No results found for: HGBA1C No results found for: CHOL, HDL, LDLCALC, LDLDIRECT, TRIG, CHOLHDL  Significant Diagnostic Results in last 30 days:  No results found.  Assessment/Plan  #1-suspected contact dermatitis- will treat with hydrocortisone cream as needed until resolved.  Also will titrate down the neu pro-- to discontinue and  monitor for increased symptoms of Parkinson's.  Per chart review it appears she has been on the neuro pro since approximately 1 April will titrate this down again and monitor   #2 anxiety with history oofDementia--we will increase Ativan slightly to 0.5 mg twice daily continue to hold for sedation or respiratory  depression apparently this has not been an issue--  #3 history of hypokalemia in the past this is stabilized potassium was 3.9 on lab done on June 7-she is not on supplementation nonetheless appears to be stable  [id number]

## 2017-09-24 ENCOUNTER — Other Ambulatory Visit: Payer: Self-pay

## 2017-09-24 MED ORDER — LORAZEPAM 0.5 MG PO TABS: 0.5000 mg | ORAL_TABLET | Freq: Two times a day (BID) | ORAL | 0 refills | Status: DC

## 2017-09-24 NOTE — Telephone Encounter (Signed)
RX Fax for [facility]@ [phone]  

## 2017-09-28 ENCOUNTER — Encounter: Payer: Self-pay | Admitting: Internal Medicine

## 2017-09-28 ENCOUNTER — Non-Acute Institutional Stay (SKILLED_NURSING_FACILITY): Payer: Medicare Other | Admitting: Internal Medicine

## 2017-09-28 ENCOUNTER — Other Ambulatory Visit: Payer: Self-pay

## 2017-09-28 DIAGNOSIS — I10 Essential (primary) hypertension: Secondary | ICD-10-CM

## 2017-09-28 DIAGNOSIS — F0281 Dementia in other diseases classified elsewhere with behavioral disturbance: Secondary | ICD-10-CM

## 2017-09-28 DIAGNOSIS — J410 Simple chronic bronchitis: Secondary | ICD-10-CM

## 2017-09-28 DIAGNOSIS — F02818 Dementia in other diseases classified elsewhere, unspecified severity, with other behavioral disturbance: Secondary | ICD-10-CM

## 2017-09-28 DIAGNOSIS — G20A1 Parkinson's disease without dyskinesia, without mention of fluctuations: Secondary | ICD-10-CM

## 2017-09-28 DIAGNOSIS — G20 Parkinson's disease: Secondary | ICD-10-CM | POA: Diagnosis not present

## 2017-09-28 MED ORDER — LORAZEPAM 0.5 MG PO TABS: 0.2500 mg | ORAL_TABLET | Freq: Two times a day (BID) | ORAL | 0 refills | Status: DC

## 2017-09-28 NOTE — Progress Notes (Signed)
Location:   [facility] Room Number: 152/D Place of Service:  SNF (31) Provider:  Clayborne DanaAnjali, Caysie Minnifield  Brown, [name], NP  Patient Care Team: [name], [name], NP as PCP - General (Nurse Practitioner)  Extended Emergency Contact Information Primary Emergency Contact: Burnsworth,[name] Address: [address]          [place], [place] [address] [place] Home Phone: [phone] Relation: Spouse Secondary Emergency Contact: Rohe,[name] Address: [address]          [place], [place] [address] [place] Home Phone: [phone] Mobile Phone: [phone] Relation: Son  Code Status:  DNR Goals of care: Advanced Directive information Advanced Directives 09/28/2017  Does Patient Have a Medical Advance Directive? Yes  Type of Advance Directive Out of facility DNR (pink MOST or yellow form)  Does patient want to make changes to medical advance directive? No - Patient declined  Copy of Healthcare Power of Attorney in Chart? No - copy requested  Would patient like information on creating a medical advance directive? -  Pre-existing out of facility DNR order (yellow form or pink MOST form) -     Chief Complaint  Patient presents with  . Acute Visit    Patient c/o Drowsiness per family     HPI:  Pt is a 84 y.o. female seen today for an acute visit as Per family they have noticed patient to be more Sleepy recently and has not been responding to them.   Patient has a history of hypertension, Parkinson disease with hallucinations, recurrent falls, COPD,Humerus fracture 09/18, Low back pain due to compression Fracture, Osteoporosis, Primary open angle GlaucomaAdmitted to SNF after Closed Bilateral Fracture of Pubic Rami And recent Admission to hospital for Fecal Impaction.  Patient had recent increase in her Ativan to 0.5 mg BID for Persistent Agitation and restlessness. It seems the changes were made on 06/12. And since then Family has  noticed patient to be more Sleepy and over the weekend she was not responding to her Name. Patient is unable to give me any history due to her Dementia But Nurses have not Noticed any Fever or  Chills. No Cough or SOB.  Past Medical History:  Diagnosis Date  . Arthritis    osteoporosis  . Back pain   . COPD (chronic obstructive pulmonary disease) (HCC)   . Dementia   . Gait instability   . Glaucoma   . Glaucoma   . Hypertension   . Neck pain   . Parkinson's disease (HCC)   . Tremor    Past Surgical History:  Procedure Laterality Date  . CATARACT EXTRACTION    . EYE SURGERY    . TONSILLECTOMY    . TUBAL LIGATION      Allergies  Allergen Reactions  . Hydrochlorothiazide Other (See Comments)    Reaction:  Dizziness   . Lisinopril Cough  . Other Other (See Comments)    Pt states that she is allergic to multiple BP meds -- does not recall the names.   Reaction:  Dizziness   . Spironolactone Other (See Comments)    Reaction:  Blurred vision and sweating of feet     Outpatient Encounter Medications as of 09/28/2017  Medication Sig  . acetaminophen (TYLENOL) 325 MG tablet Take 650 mg by mouth every 6 (six) hours as needed.  Marland Kitchen. albuterol (PROVENTIL HFA;VENTOLIN HFA) 108 (90 Base) MCG/ACT inhaler Inhale 1-2 puffs into the lungs every 6 (six) hours as needed for wheezing  or shortness of breath.  Marland Kitchen aspirin EC 81 MG tablet Take 81 mg by mouth daily.  Marland Kitchen atenolol (TENORMIN) 50 MG tablet Take 25 mg by mouth daily.   . bisacodyl (DULCOLAX) 10 MG suppository Place 1 suppository (10 mg total) rectally as needed for moderate constipation.  . budesonide-formoterol (SYMBICORT) 160-4.5 MCG/ACT inhaler Inhale 2 puffs into the lungs 2 (two) times daily.  Marland Kitchen docusate sodium (COLACE) 100 MG capsule Take 1 capsule (100 mg total) by mouth 2 (two) times daily.  Marland Kitchen linaclotide (LINZESS) 145 MCG CAPS capsule Take 1 capsule (145 mcg total) by mouth daily before breakfast.  . LORazepam (ATIVAN) 0.5 MG  tablet Take 1 tablet (0.5 mg total) by mouth 2 (two) times daily.  . Melatonin 3 MG TABS Take 3 mg by mouth at bedtime.  . Menthol (ICY HOT ADVANCED RELIEF) 7.5 % PTCH Apply 1 patch topically every 12 (twelve) hours.  . nitroGLYCERIN (NITROSTAT) 0.4 MG SL tablet Place 1 tablet under the tongue as directed. Place 1 tablet under the tongue every five minutes as needed for emergency chest pain  . polyethylene glycol (MIRALAX / GLYCOLAX) packet Take 17 g by mouth daily as needed.  . Polyvinyl Alcohol-Povidone PF 1.4-0.6 % SOLN Apply 1 drop to eye 2 (two) times daily as needed.  . ranitidine (ZANTAC) 150 MG tablet Take 150 mg by mouth daily.  . timolol (BETIMOL) 0.5 % ophthalmic solution Place 1 drop into both eyes 2 (two) times daily.  . [DISCONTINUED] rotigotine (NEUPRO) 6 MG/24HR Place 1 patch onto the skin daily.   No facility-administered encounter medications on file as of 09/28/2017.      Review of Systems  Unable to perform ROS: Dementia     There is no immunization history on file for this patient. Pertinent  Health Maintenance Due  Topic Date Due  . DEXA SCAN  10/21/2017 (Originally 01/02/1996)  . PNA vac Low Risk Adult (1 of 2 - PCV13) 10/21/2017 (Originally 01/02/1996)  . INFLUENZA VACCINE  11/11/2017   Fall Risk  08/05/2017 07/18/2015 03/19/2015 02/12/2015  Falls in the past year? Yes Yes No Yes  Number falls in past yr: 2 or more 1 - 2 or more  Injury with Fall? Yes No - Yes  Comment hip fracture - - left lower leg   Risk for fall due to : - (No Data) - -  Risk for fall due to: Comment - accidently hit by door that pushed her down - -   Functional Status Survey:    Vitals:   09/28/17 1303  BP: 130/82  Pulse: 84  Resp: 18  Temp: 97.8 F (36.6 C)  SpO2: 97%   There is no height or weight on file to calculate BMI. Physical Exam  HENT:  Head: Normocephalic.  Mouth/Throat: Oropharynx is clear and moist.  Eyes: Pupils are equal, round, and reactive to light.  Neck:  Neck supple.  Cardiovascular: Normal rate and normal heart sounds.  No murmur heard. Pulmonary/Chest: Effort normal and breath sounds normal. No respiratory distress. No wheezes. She has no rales.  Abdominal: Soft. Bowel sounds are normal. No distension. There is no tenderness. There is no rebound.  Musculoskeletal: No edema.  Lymphadenopathy: none Neurological: Alert and Follow some simple Commands.No Focal deficits. She Seems more Alert today Skin: Skin is warm and dry.  Psychiatric: Normal mood and affect. Behavior is normal. Thought content normal.   Labs reviewed: Recent Labs    06/29/17 0742  08/21/17 0553 08/27/17 0725 09/17/17  0740  NA 134*   < > 139 136 137  K 3.8   < > 4.0 3.1* 3.9  CL 97*   < > 107 104 105  CO2 24   < > 26 25 27   GLUCOSE 144*   < > 91 93 99  BUN 18   < > 14 13 14   CREATININE 0.58   < > 0.62 0.57 0.55  CALCIUM 8.8*   < > 8.5* 8.3* 9.1  MG 1.9  --   --   --   --    < > = values in this interval not displayed.   Recent Labs    06/29/17 1058 08/20/17 1807 08/21/17 0553  AST 20 27 21   ALT 10* 16 13*  ALKPHOS 86 89 67  BILITOT 0.8 1.0 0.8  PROT 7.9 8.4* 6.6  ALBUMIN 3.1* 3.5 2.7*   Recent Labs    05/11/17 0709  06/29/17 1058 08/20/17 1807 08/21/17 0553 08/27/17 0725  WBC 11.3*   < > 10.1 10.4 8.3 7.8  NEUTROABS 8.2*  --  7.1 6.4  --   --   HGB 11.0*   < > 12.3 13.4 11.4* 12.0  HCT 35.1*   < > 39.3 41.9 35.9* 37.5  MCV 96.2   < > 91.8 90.1 90.4 88.2  PLT 770*   < > 360 325 299 323   < > = values in this interval not displayed.   Lab Results  Component Value Date   TSH 1.419 06/16/2017   No results found for: HGBA1C No results found for: CHOL, HDL, LDLCALC, LDLDIRECT, TRIG, CHOLHDL  Significant Diagnostic Results in last 30 days:  No results found.  Assessment/Plan  Increased Drowsiness I and decreased her Ativan to 0.25 mg BID She seems to be doing well today. At this time her symptoms seems to be related to increased dose  of Ativan. Will not do further work up. But if symptoms recur consider other causes. Parkinson's disease Patient has Allergic reaction to the patch and it was tapered off. If her symptoms of Parkinson worsen will consider Sinemet. It seems she has not tolerated that as outpatient Dementiawith Behavior Issues Patient Continues to have decline and is now Wheel Chair bound. She was seen by palliative Care and was made DNR COPD On ProAir air and Symbicort Essential hypertension Discontinued Norvasc as BP low.  Stable in Metoprolol GERD Her Husband says she has no trouble with that anymore. Will discontinue Zantac for now. H/o Fecal impaction Continue on Linzess and Colace.  Family/ staff Communication:   Labs/tests ordered:

## 2017-09-28 NOTE — Telephone Encounter (Signed)
RX Fax for [facility]@ [phone]  

## 2017-09-30 ENCOUNTER — Non-Acute Institutional Stay (SKILLED_NURSING_FACILITY): Payer: Medicare Other | Admitting: Internal Medicine

## 2017-09-30 ENCOUNTER — Encounter: Payer: Self-pay | Admitting: Internal Medicine

## 2017-09-30 DIAGNOSIS — G20 Parkinson's disease: Secondary | ICD-10-CM

## 2017-09-30 DIAGNOSIS — R600 Localized edema: Secondary | ICD-10-CM | POA: Diagnosis not present

## 2017-09-30 DIAGNOSIS — J410 Simple chronic bronchitis: Secondary | ICD-10-CM | POA: Diagnosis not present

## 2017-09-30 NOTE — Progress Notes (Signed)
Location:   [facility] Room Number: 152/D Place of Service:  SNF [phone]) Provider:  Kallie EdwardAnjali Davis  Sandra Davis Sandra Davis, Sandra Davis  Patient Care Team: [name], Sandra Davis, Sandra Davis as PCP - General (Nurse Practitioner)  Extended Emergency Contact Information Primary Emergency Contact: Sandra Davis,Sandra Davis Address: [address]          [place], [place] [address] [place] Home Phone: [phone] Relation: Spouse Secondary Emergency Contact: Hemmelgarn,[name] Address: [address]          [place], [place] [address] [place] Home Phone: [phone] Mobile Phone: [phone] Relation: Son  Code Status:  DNR Goals of care: Advanced Directive information Advanced Directives 09/30/2017  Does Patient Have a Medical Advance Directive? Yes  Type of Advance Directive Out of facility DNR (pink MOST or yellow form)  Does patient want to make changes to medical advance directive? No - Patient declined  Copy of Healthcare Power of Attorney in Chart? No - copy requested  Would patient like information on creating a medical advance directive? -  Pre-existing out of facility DNR order (yellow form or pink MOST form) -     Chief Complaint  Patient presents with  . Acute Visit    Patient is being seen for Pitting of Lower Extremities    HPI:  Pt is a 84 y.o. female seen today for an acute visit for Swelling in her LE as noticed by her family. Patient has a history of hypertension, Parkinson disease with hallucinations, recurrent falls, COPD,Humerus fracture 09/18, Low back pain due to compression Fracture, Osteoporosis, Primary open angle Glaucoma. Bilateral Closed Pubic Rami fracture, h/o Fecal Impaction, Dementia  Patient was noticed to have swelling in her bilateral LE as per her Family. Patient unable to give any history. But there is no h/o Cough Chest pain or SOB. Her weight today is 85.8 which is 2 lbs more then her Baseline weight.    Past Medical  History:  Diagnosis Date  . Arthritis    osteoporosis  . Back pain   . COPD (chronic obstructive pulmonary disease) (HCC)   . Dementia   . Gait instability   . Glaucoma   . Glaucoma   . Hypertension   . Neck pain   . Parkinson's disease (HCC)   . Tremor    Past Surgical History:  Procedure Laterality Date  . CATARACT EXTRACTION    . EYE SURGERY    . TONSILLECTOMY    . TUBAL LIGATION      Allergies  Allergen Reactions  . Hydrochlorothiazide Other (See Comments)    Reaction:  Dizziness   . Lisinopril Cough  . Other Other (See Comments)    Pt states that she is allergic to multiple BP meds -- does not recall the names.   Reaction:  Dizziness   . Spironolactone Other (See Comments)    Reaction:  Blurred vision and sweating of feet     Outpatient Encounter Medications as of 09/30/2017  Medication Sig  . acetaminophen (TYLENOL) 325 MG tablet Take 650 mg by mouth every 6 (six) hours as needed.  Marland Kitchen. albuterol (PROVENTIL HFA;VENTOLIN HFA) 108 (90 Base) MCG/ACT inhaler Inhale 1-2 puffs into the lungs every 6 (six) hours as needed for wheezing or shortness of breath.  Marland Kitchen. aspirin EC 81 MG tablet Take 81 mg by mouth daily.  Marland Kitchen. atenolol (TENORMIN) 50 MG tablet Take 25 mg by mouth daily.   . bisacodyl (DULCOLAX) 10 MG suppository Place 1 suppository (10  mg total) rectally as needed for moderate constipation.  . budesonide-formoterol (SYMBICORT) 160-4.5 MCG/ACT inhaler Inhale 2 puffs into the lungs 2 (two) times daily.  Marland Kitchen docusate sodium (COLACE) 100 MG capsule Take 1 capsule (100 mg total) by mouth 2 (two) times daily.  Marland Kitchen linaclotide (LINZESS) 145 MCG CAPS capsule Take 1 capsule (145 mcg total) by mouth daily before breakfast.  . LORazepam (ATIVAN) 0.5 MG tablet Take 0.5 tablets (0.25 mg total) by mouth 2 (two) times daily.  . Melatonin 3 MG TABS Take 3 mg by mouth at bedtime.  . Menthol (ICY HOT ADVANCED RELIEF) 7.5 % PTCH Apply 1 patch topically every 12 (twelve) hours.  . nitroGLYCERIN  (NITROSTAT) 0.4 MG SL tablet Place 1 tablet under the tongue as directed. Place 1 tablet under the tongue every five minutes as needed for emergency chest pain  . polyethylene glycol (MIRALAX / GLYCOLAX) packet Take 17 g by mouth daily as needed.  . Polyvinyl Alcohol-Povidone PF 1.4-0.6 % SOLN Apply 1 drop to eye 2 (two) times daily as needed.  . timolol (BETIMOL) 0.5 % ophthalmic solution Place 1 drop into both eyes 2 (two) times daily.  . [DISCONTINUED] ranitidine (ZANTAC) 150 MG tablet Take 150 mg by mouth daily.   No facility-administered encounter medications on file as of 09/30/2017.      Review of Systems  Unable to perform ROS: Dementia     There is no immunization history on file for this patient. Pertinent  Health Maintenance Due  Topic Date Due  . DEXA SCAN  10/21/2017 (Originally 01/02/1996)  . PNA vac Low Risk Adult (1 of 2 - PCV13) 10/21/2017 (Originally 01/02/1996)  . INFLUENZA VACCINE  11/11/2017   Fall Risk  08/05/2017 07/18/2015 03/19/2015 02/12/2015  Falls in the past year? Yes Yes No Yes  Number falls in past yr: 2 or more 1 - 2 or more  Injury with Fall? Yes No - Yes  Comment hip fracture - - left lower leg   Risk for fall due to : - (No Data) - -  Risk for fall due to: Comment - accidently hit by door that pushed her down - -   Functional Status Survey:    Vitals:   09/30/17 0944  BP: 108/60  Pulse: 78  Resp: 17  Temp: (!) 97.3 F (36.3 C)  TempSrc: Oral   There is no height or weight on file to calculate BMI. Physical Exam  Constitutional: She appears well-developed.  HENT:  Head: Normocephalic.  Mouth/Throat: Oropharynx is clear and moist.  Eyes: Pupils are equal, round, and reactive to light.  Neck: Neck supple.  Cardiovascular: Normal rate and regular rhythm.  Pulmonary/Chest: Effort normal and breath sounds normal. No stridor. No respiratory distress. She has no wheezes.  Abdominal: Soft. Bowel sounds are normal. She exhibits no distension. There  is no tenderness. There is no guarding.  Musculoskeletal:  Trace edema Bilateral  Neurological: She is alert.  Skin: Skin is warm and dry.  Psychiatric: She has a normal mood and affect. Her behavior is normal.    Labs reviewed: Recent Labs    06/29/17 0742  08/21/17 0553 08/27/17 0725 09/17/17 0740  NA 134*   < > 139 136 137  K 3.8   < > 4.0 3.1* 3.9  CL 97*   < > 107 104 105  CO2 24   < > 26 25 27   GLUCOSE 144*   < > 91 93 99  BUN 18   < >  14 13 14   CREATININE 0.58   < > 0.62 0.57 0.55  CALCIUM 8.8*   < > 8.5* 8.3* 9.1  MG 1.9  --   --   --   --    < > = values in this interval not displayed.   Recent Labs    06/29/17 1058 08/20/17 1807 08/21/17 0553  AST 20 27 21   ALT 10* 16 13*  ALKPHOS 86 89 67  BILITOT 0.8 1.0 0.8  PROT 7.9 8.4* 6.6  ALBUMIN 3.1* 3.5 2.7*   Recent Labs    05/11/17 0709  06/29/17 1058 08/20/17 1807 08/21/17 0553 08/27/17 0725  WBC 11.3*   < > 10.1 10.4 8.3 7.8  NEUTROABS 8.2*  --  7.1 6.4  --   --   HGB 11.0*   < > 12.3 13.4 11.4* 12.0  HCT 35.1*   < > 39.3 41.9 35.9* 37.5  MCV 96.2   < > 91.8 90.1 90.4 88.2  PLT 770*   < > 360 325 299 323   < > = values in this interval not displayed.   Lab Results  Component Value Date   TSH 1.419 06/16/2017   No results found for: HGBA1C No results found for: CHOL, HDL, LDLCALC, LDLDIRECT, TRIG, CHOLHDL  Significant Diagnostic Results in last 30 days:  No results found.  Assessment/Plan  Lower Extremity Edema At this time with her frailty would not start her on any Diuretic. Will try some Compression Stockings. Continue to watch her weight. If any worsening will consider Lowe Dose Lasix D/W the Son in the room  Parkinson'sdisease Patient has Allergic reaction to the patch and it was tapered off. If her symptoms of Parkinson worsen will consider Sinemet. It seems she has not tolerated that as outpatient Dementiawith Behavior Issues PatientContinues to have decline and is now Wheel  Chair bound. Since her ativan was reduced patient has been more Alert She was seen by palliative Care and was made DNR COPD On ProAir air and Symbicort Essential hypertension Discontinued Norvasc as BP low. Stable in Metoprolol GERD Her Husband says she has no trouble with that anymore. Zantac was discontinued H/o Fecal impaction Continue on Linzess and Colace.    Family/ staff Communication:   Labs/tests ordered:  Total time spent in this patient care encounter was 25_ minutes; greater than 50% of the visit spent counseling patient, reviewing records , Labs and coordinating care for problems addressed at this encounter.

## 2017-10-05 ENCOUNTER — Non-Acute Institutional Stay (SKILLED_NURSING_FACILITY): Payer: Medicare Other | Admitting: Internal Medicine

## 2017-10-05 ENCOUNTER — Encounter: Payer: Self-pay | Admitting: Internal Medicine

## 2017-10-05 DIAGNOSIS — L600 Ingrowing nail: Secondary | ICD-10-CM

## 2017-10-05 DIAGNOSIS — H6123 Impacted cerumen, bilateral: Secondary | ICD-10-CM | POA: Diagnosis not present

## 2017-10-05 DIAGNOSIS — R609 Edema, unspecified: Secondary | ICD-10-CM | POA: Diagnosis not present

## 2017-10-05 NOTE — Progress Notes (Signed)
Location:   [facility] Room Number: 152/D Place of Service:  SNF [phone]) Provider:  Jeanine Luz, NP  Patient Care Team: Estevan Oaks, NP as PCP - General (Nurse Practitioner)  Extended Emergency Contact Information Primary Emergency Contact: Drakes,[name] Address: [address]          [place], [place] [address] [place] Home Phone: [phone] Relation: Spouse Secondary Emergency Contact: Meller,[name] Address: [address]          [place], [place] [address] [place] Home Phone: [phone] Mobile Phone: [phone] Relation: Son  Code Status:  DNR Goals of care: Advanced Directive information Advanced Directives 10/05/2017  Does Patient Have a Medical Advance Directive? Yes  Type of Advance Directive Out of facility DNR (pink MOST or yellow form)  Does patient want to make changes to medical advance directive? No - Patient declined  Copy of Healthcare Power of Attorney in Chart? No - copy requested  Would patient like information on creating a medical advance directive? -  Pre-existing out of facility DNR order (yellow form or pink MOST form) -     Chief Complaint  Patient presents with  . Acute Visit    Patient c/o Finger Issues and  Ear wax build up    HPI:  Pt is a 84 y.o. female seen today for an acute visit for for apparent wax buildup in her ears.  Also possibly left thumb ingrown nail  lPatient has a history of hypertension, Parkinson disease with hallucinations, recurrent falls, COPD,Humerus fracture 09/18, Low back pain due to compression Fracture, Osteoporosis, Primary open angle Glaucoma. Bilateral Closed Pubic Rami fracture, h/o Fecal Impaction, Dementia  Per nursing there are concerns she has some increased wax in her ears I am following up on this.  Also noted yesterday when patient was ambulating in hallway her husband pointed out she had what appeared to be possibly a  small ingrown nail left thumb minimal amount of erythema I am following up on this as well  Patient also was seen several days ago by Dr. Chales Abrahams for increased leg edema- Dr. Chales Abrahams did order compression hose --she has gained a small amount of weight but edema appears to be quite minimal today        Past Medical History:  Diagnosis Date  . Arthritis    osteoporosis  . Back pain   . COPD (chronic obstructive pulmonary disease) (HCC)   . Dementia   . Gait instability   . Glaucoma   . Glaucoma   . Hypertension   . Neck pain   . Parkinson's disease (HCC)   . Tremor    Past Surgical History:  Procedure Laterality Date  . CATARACT EXTRACTION    . EYE SURGERY    . TONSILLECTOMY    . TUBAL LIGATION      Allergies  Allergen Reactions  . Hydrochlorothiazide Other (See Comments)    Reaction:  Dizziness   . Lisinopril Cough  . Other Other (See Comments)    Pt states that she is allergic to multiple BP meds -- does not recall the names.   Reaction:  Dizziness   . Spironolactone Other (See Comments)    Reaction:  Blurred vision and sweating of feet     Outpatient Encounter Medications as of 10/05/2017  Medication Sig  . acetaminophen (TYLENOL) 325 MG tablet Take 650 mg by mouth every 6 (six) hours as needed.  Marland Kitchen albuterol (PROVENTIL HFA;VENTOLIN  HFA) 108 (90 Base) MCG/ACT inhaler Inhale 1-2 puffs into the lungs every 6 (six) hours as needed for wheezing or shortness of breath.  Marland Kitchen aspirin EC 81 MG tablet Take 81 mg by mouth daily.  Marland Kitchen atenolol (TENORMIN) 50 MG tablet Take 25 mg by mouth daily.   . bisacodyl (DULCOLAX) 10 MG suppository Place 1 suppository (10 mg total) rectally as needed for moderate constipation.  . budesonide-formoterol (SYMBICORT) 160-4.5 MCG/ACT inhaler Inhale 2 puffs into the lungs 2 (two) times daily.  Marland Kitchen docusate sodium (COLACE) 100 MG capsule Take 1 capsule (100 mg total) by mouth 2 (two) times daily.  Marland Kitchen linaclotide (LINZESS) 145 MCG CAPS capsule Take 1  capsule (145 mcg total) by mouth daily before breakfast.  . LORazepam (ATIVAN) 0.5 MG tablet Take 0.5 tablets (0.25 mg total) by mouth 2 (two) times daily.  . Melatonin 3 MG TABS Take 3 mg by mouth at bedtime.  . Menthol (ICY HOT ADVANCED RELIEF) 7.5 % PTCH Apply 1 patch topically every 12 (twelve) hours.  . nitroGLYCERIN (NITROSTAT) 0.4 MG SL tablet Place 1 tablet under the tongue as directed. Place 1 tablet under the tongue every five minutes as needed for emergency chest pain  . polyethylene glycol (MIRALAX / GLYCOLAX) packet Take 17 g by mouth daily as needed.  . Polyvinyl Alcohol-Povidone PF 1.4-0.6 % SOLN Apply 1 drop to eye 2 (two) times daily as needed.  . timolol (BETIMOL) 0.5 % ophthalmic solution Place 1 drop into both eyes 2 (two) times daily.   No facility-administered encounter medications on file as of 10/05/2017.     Review of Systems   Essentially unobtainable secondary to dementia-Per nursing she appears to be at her baseline- possibly may have had some diarrhea today   There is no immunization history on file for this patient. Pertinent  Health Maintenance Due  Topic Date Due  . DEXA SCAN  10/21/2017 (Originally 01/02/1996)  . PNA vac Low Risk Adult (1 of 2 - PCV13) 10/21/2017 (Originally 01/02/1996)  . INFLUENZA VACCINE  11/11/2017   Fall Risk  08/05/2017 07/18/2015 03/19/2015 02/12/2015  Falls in the past year? Yes Yes No Yes  Number falls in past yr: 2 or more 1 - 2 or more  Injury with Fall? Yes No - Yes  Comment hip fracture - - left lower leg   Risk for fall due to : - (No Data) - -  Risk for fall due to: Comment - accidently hit by door that pushed her down - -   Functional Status Survey:    Vitals:   10/05/17 1219  BP: 119/70  Pulse: 77  Resp: 18  Temp: (!) 97.4 F (36.3 C)  TempSrc: Oral   Weight is 89.4 pounds   Physical Exam   In general this is a somewhat frail-appearing elderly female who appears to be at her baseline sitting in  wheelchair  Her skin is warm and dry.  On left side of her left thumb there is a small amount of erythema at the nailbed-I do not see any drainage or significant edema-there does not appear to be acute tenderness to palpation.  Appears comparable to presentation yesterday    Eyes visual acuity appears grossly intact   ears-she has significant wax impaction both ears tympanic membrane could  not really be visualized I do not see any erythema or drainage.  Chest shallow air entry with poor respiratory effort but could not appreciate any overt congestion-or labored breathing   Heart is  regular rate and rhythm without murmur gallop or rub-she has a slight amount of pedal edema but otherwise no real evidence of edema.  Abdomen is soft-does not appear to be tender there are positive bowel sounds.  Neurologic she is alert at baseline--with baseline tremors  Psych- appears to be at baseline with history of dementia  Labs reviewed: Recent Labs    06/29/17 0742  08/21/17 0553 08/27/17 0725 09/17/17 0740  NA 134*   < > 139 136 137  K 3.8   < > 4.0 3.1* 3.9  CL 97*   < > 107 104 105  CO2 24   < > 26 25 27   GLUCOSE 144*   < > 91 93 99  BUN 18   < > 14 13 14   CREATININE 0.58   < > 0.62 0.57 0.55  CALCIUM 8.8*   < > 8.5* 8.3* 9.1  MG 1.9  --   --   --   --    < > = values in this interval not displayed.   Recent Labs    06/29/17 1058 08/20/17 1807 08/21/17 0553  AST 20 27 21   ALT 10* 16 13*  ALKPHOS 86 89 67  BILITOT 0.8 1.0 0.8  PROT 7.9 8.4* 6.6  ALBUMIN 3.1* 3.5 2.7*   Recent Labs    05/11/17 0709  06/29/17 1058 08/20/17 1807 08/21/17 0553 08/27/17 0725  WBC 11.3*   < > 10.1 10.4 8.3 7.8  NEUTROABS 8.2*  --  7.1 6.4  --   --   HGB 11.0*   < > 12.3 13.4 11.4* 12.0  HCT 35.1*   < > 39.3 41.9 35.9* 37.5  MCV 96.2   < > 91.8 90.1 90.4 88.2  PLT 770*   < > 360 325 299 323   < > = values in this interval not displayed.   Lab Results  Component Value Date   TSH  1.419 06/16/2017   No results found for: HGBA1C No results found for: CHOL, HDL, LDLCALC, LDLDIRECT, TRIG, CHOLHDL  Significant Diagnostic Results in last 30 days:  No results found.  Assessment/Plan  #1- bilateral wax impaction--will treat with Debrox 10 drops each ear twice daily for 3 days and flush with warm water on day 4 Monitor for resolution.  2.-  Left thumb erythema-this is quite minimal- will treat with a topical antibiotic  Twice  a day and monitor for any changes  #3 edema-at this point appears to be quite minimal would be hesitant to treat with diuretics secondary to her already frail status.  At this point will monitor  4.-Diarrhea?-Getting somewhat variable reports about the consistency of this-at this point will monitor--abdominal exam appear to be benign--she is on Colace twice a day as well as Linzess with a history of impaction in the past- will speak with nursing staff about holding these if needed.    Plan was discussed with Dr. Chales AbrahamsGupta  [phone]

## 2017-10-25 DIAGNOSIS — L603 Nail dystrophy: Secondary | ICD-10-CM | POA: Diagnosis not present

## 2017-10-25 DIAGNOSIS — I739 Peripheral vascular disease, unspecified: Secondary | ICD-10-CM | POA: Diagnosis not present

## 2017-10-25 DIAGNOSIS — B351 Tinea unguium: Secondary | ICD-10-CM | POA: Diagnosis not present

## 2017-10-26 DIAGNOSIS — R609 Edema, unspecified: Secondary | ICD-10-CM | POA: Diagnosis not present

## 2017-10-26 DIAGNOSIS — M79622 Pain in left upper arm: Secondary | ICD-10-CM | POA: Diagnosis not present

## 2017-10-26 DIAGNOSIS — M79642 Pain in left hand: Secondary | ICD-10-CM | POA: Diagnosis not present

## 2017-10-28 ENCOUNTER — Other Ambulatory Visit: Payer: Self-pay

## 2017-10-28 MED ORDER — LORAZEPAM 0.5 MG PO TABS: 0.2500 mg | ORAL_TABLET | Freq: Two times a day (BID) | ORAL | 0 refills | Status: DC

## 2017-10-28 NOTE — Telephone Encounter (Signed)
RX Fax for [facility]@ [phone]  

## 2017-10-30 ENCOUNTER — Encounter (HOSPITAL_COMMUNITY)
Admission: RE | Admit: 2017-10-30 | Discharge: 2017-10-30 | Disposition: A | Discharge status: Home / Self Care | Payer: Medicare Other | Source: Skilled Nursing Facility | Attending: Internal Medicine | Admitting: Internal Medicine

## 2017-10-30 DIAGNOSIS — R300 Dysuria: Secondary | ICD-10-CM | POA: Insufficient documentation

## 2017-10-30 DIAGNOSIS — M545 Low back pain: Secondary | ICD-10-CM | POA: Insufficient documentation

## 2017-10-30 DIAGNOSIS — R350 Frequency of micturition: Secondary | ICD-10-CM | POA: Insufficient documentation

## 2017-10-30 LAB — URINALYSIS, ROUTINE W REFLEX MICROSCOPIC
Bilirubin Urine: NEGATIVE
Glucose, UA: NEGATIVE mg/dL
Ketones, ur: NEGATIVE mg/dL
Leukocytes, UA: NEGATIVE
Nitrite: NEGATIVE
PROTEIN: NEGATIVE mg/dL
Specific Gravity, Urine: 1.021 (ref 1.005–1.030)
pH: 5 (ref 5.0–8.0)

## 2017-11-01 LAB — URINE CULTURE

## 2017-11-09 ENCOUNTER — Non-Acute Institutional Stay (SKILLED_NURSING_FACILITY): Payer: Medicare Other | Admitting: Internal Medicine

## 2017-11-09 ENCOUNTER — Encounter: Payer: Self-pay | Admitting: Internal Medicine

## 2017-11-09 DIAGNOSIS — I10 Essential (primary) hypertension: Secondary | ICD-10-CM | POA: Diagnosis not present

## 2017-11-09 DIAGNOSIS — G20 Parkinson's disease: Secondary | ICD-10-CM | POA: Diagnosis not present

## 2017-11-09 DIAGNOSIS — S32591S Other specified fracture of right pubis, sequela: Secondary | ICD-10-CM | POA: Diagnosis not present

## 2017-11-09 DIAGNOSIS — J410 Simple chronic bronchitis: Secondary | ICD-10-CM | POA: Diagnosis not present

## 2017-11-09 DIAGNOSIS — F0281 Dementia in other diseases classified elsewhere with behavioral disturbance: Secondary | ICD-10-CM

## 2017-11-09 DIAGNOSIS — S32592S Other specified fracture of left pubis, sequela: Secondary | ICD-10-CM

## 2017-11-09 DIAGNOSIS — R296 Repeated falls: Secondary | ICD-10-CM | POA: Diagnosis not present

## 2017-11-09 DIAGNOSIS — K5900 Constipation, unspecified: Secondary | ICD-10-CM | POA: Diagnosis not present

## 2017-11-09 NOTE — Progress Notes (Addendum)
Location:   [facility] Room Number: 152/D Place of Service:  SNF [phone]) Provider:  Einar Crow MD  Estevan Oaks, NP  Patient Care Team: Estevan Oaks, NP as PCP - General (Nurse Practitioner)  Extended Emergency Contact Information Primary Emergency Contact: Noack,[name] Address: [address]          [place], [place] [address] [place] of [place] Home Phone: [phone] Relation: Spouse Secondary Emergency Contact: Volk,[name] Address: [address]          [place], [place] [address] [place] Home Phone: [phone] Mobile Phone: [phone] Relation: Son  Code Status:  DNR Goals of care: Advanced Directive information Advanced Directives 11/09/2017  Does Patient Have a Medical Advance Directive? -  Type of Advance Directive Out of facility DNR (pink MOST or yellow form)  Does patient want to make changes to medical advance directive? No - Patient declined  Copy of Healthcare Power of Attorney in Chart? No - copy requested  Would patient like information on creating a medical advance directive? -  Pre-existing out of facility DNR order (yellow form or pink MOST form) -     Chief Complaint  Patient presents with  . Medical Management of Chronic Issues    Patient is being seen for routine visit of Medical Management  . Acute Visit    Patient c/o Anxiety    HPI:  Pt is a 84 y.o. female seen today for an Routine Visit. She is also been seen for Management of her Anxiety. Patient has a history of hypertension, Parkinson disease with hallucinations, recurrent falls, COPD,Humerus fracture 09/18, Low back pain due to compression Fracture, Osteoporosis, Primary open angle Glaucoma. Bilateral Closed Pubic Rami fracture, h/o Fecal Impaction, Dementia and Anxiety Patient is doing better in the facility.  She has gained weight and now is up to 93 pounds.  Her only complaint per nurses is anxiety in the morning she calls for  her husband and wants to go home.  She is on low-dose of Ativan. Patient had no new complaints today.  There were no new nursing issues.  Her husband was little concerned about questionable redness around her patch.    Past Medical History:  Diagnosis Date  . Arthritis    osteoporosis  . Back pain   . COPD (chronic obstructive pulmonary disease) (HCC)   . Dementia   . Gait instability   . Glaucoma   . Glaucoma   . Hypertension   . Neck pain   . Parkinson's disease (HCC)   . Tremor    Past Surgical History:  Procedure Laterality Date  . CATARACT EXTRACTION    . EYE SURGERY    . TONSILLECTOMY    . TUBAL LIGATION      Allergies  Allergen Reactions  . Hydrochlorothiazide Other (See Comments)    Reaction:  Dizziness   . Lisinopril Cough  . Other Other (See Comments)    Pt states that she is allergic to multiple BP meds -- does not recall the names.   Reaction:  Dizziness   . Spironolactone Other (See Comments)    Reaction:  Blurred vision and sweating of feet     Outpatient Encounter Medications as of 11/09/2017  Medication Sig  . acetaminophen (TYLENOL) 325 MG tablet Take 650 mg by mouth every 6 (six) hours as needed.  Marland Kitchen aspirin EC 81 MG tablet Take 81 mg by mouth daily.  Marland Kitchen atenolol (TENORMIN) 50 MG tablet Take 25 mg  by mouth daily.   . bisacodyl (DULCOLAX) 10 MG suppository Place 1 suppository (10 mg total) rectally as needed for moderate constipation.  . budesonide-formoterol (SYMBICORT) 160-4.5 MCG/ACT inhaler Inhale 2 puffs into the lungs 2 (two) times daily.  Marland Kitchen. docusate sodium (COLACE) 100 MG capsule Take 1 capsule (100 mg total) by mouth 2 (two) times daily.  Marland Kitchen. linaclotide (LINZESS) 145 MCG CAPS capsule Take 1 capsule (145 mcg total) by mouth daily before breakfast.  . LORazepam (ATIVAN) 0.5 MG tablet Take 0.5 tablets (0.25 mg total) by mouth 2 (two) times daily.  . Melatonin 3 MG TABS Take 3 mg by mouth at bedtime.  . Menthol (ICY HOT ADVANCED RELIEF) 7.5 % PTCH  Apply 1 patch topically every 12 (twelve) hours.  . nitroGLYCERIN (NITROSTAT) 0.4 MG SL tablet Place 1 tablet under the tongue as directed. Place 1 tablet under the tongue every five minutes as needed for emergency chest pain  . polyethylene glycol (MIRALAX / GLYCOLAX) packet Take 17 g by mouth daily as needed.  . Polyvinyl Alcohol-Povidone PF 1.4-0.6 % SOLN Apply 1 drop to eye 2 (two) times daily as needed.  . rotigotine (NEUPRO) 4 MG/24HR Place 1 patch onto the skin daily.  . timolol (BETIMOL) 0.5 % ophthalmic solution Place 1 drop into both eyes 2 (two) times daily.  . [DISCONTINUED] albuterol (PROVENTIL HFA;VENTOLIN HFA) 108 (90 Base) MCG/ACT inhaler Inhale 1-2 puffs into the lungs every 6 (six) hours as needed for wheezing or shortness of breath.   No facility-administered encounter medications on file as of 11/09/2017.      Review of Systems  Unable to perform ROS: Dementia     There is no immunization history on file for this patient. Pertinent  Health Maintenance Due  Topic Date Due  . DEXA SCAN  12/10/2017 (Originally 01/02/1996)  . PNA vac Low Risk Adult (1 of 2 - PCV13) 12/10/2017 (Originally 01/02/1996)  . INFLUENZA VACCINE  11/11/2017   Fall Risk  08/05/2017 07/18/2015 03/19/2015 02/12/2015  Falls in the past year? Yes Yes No Yes  Number falls in past yr: 2 or more 1 - 2 or more  Injury with Fall? Yes No - Yes  Comment hip fracture - - left lower leg   Risk for fall due to : - (No Data) - -  Risk for fall due to: Comment - accidently hit by door that pushed her down - -   Functional Status Survey:    Vitals:   11/09/17 1614  BP: (!) 176/76  Pulse: 69  Resp: 19  Temp: 97.8 F (36.6 C)  SpO2: 97%   There is no height or weight on file to calculate BMI. Physical Exam  Constitutional: She appears well-developed.  HENT:  Head: Normocephalic.  Mouth/Throat: Oropharynx is clear and moist.  Eyes: Pupils are equal, round, and reactive to light.  Neck: Neck supple.    Cardiovascular: Normal rate and regular rhythm.  Pulmonary/Chest: Effort normal and breath sounds normal. No stridor. No respiratory distress. She has no wheezes.  Abdominal: Soft. Bowel sounds are normal. She exhibits no distension. There is no tenderness. There is no guarding.  Musculoskeletal: She exhibits no edema.  Neurological: She is alert.  Tremors in Both Hands   Skin: Skin is warm and dry.  Psychiatric: She has a normal mood and affect. Her behavior is normal. Thought content normal.    Labs reviewed: Recent Labs    06/29/17 0742  08/21/17 0553 08/27/17 0725 09/17/17 0740  NA 134*   < >  139 136 137  K 3.8   < > 4.0 3.1* 3.9  CL 97*   < > 107 104 105  CO2 24   < > 26 25 27   GLUCOSE 144*   < > 91 93 99  BUN 18   < > 14 13 14   CREATININE 0.58   < > 0.62 0.57 0.55  CALCIUM 8.8*   < > 8.5* 8.3* 9.1  MG 1.9  --   --   --   --    < > = values in this interval not displayed.   Recent Labs    06/29/17 1058 08/20/17 1807 08/21/17 0553  AST 20 27 21   ALT 10* 16 13*  ALKPHOS 86 89 67  BILITOT 0.8 1.0 0.8  PROT 7.9 8.4* 6.6  ALBUMIN 3.1* 3.5 2.7*   Recent Labs    05/11/17 0709  06/29/17 1058 08/20/17 1807 08/21/17 0553 08/27/17 0725  WBC 11.3*   < > 10.1 10.4 8.3 7.8  NEUTROABS 8.2*  --  7.1 6.4  --   --   HGB 11.0*   < > 12.3 13.4 11.4* 12.0  HCT 35.1*   < > 39.3 41.9 35.9* 37.5  MCV 96.2   < > 91.8 90.1 90.4 88.2  PLT 770*   < > 360 325 299 323   < > = values in this interval not displayed.   Lab Results  Component Value Date   TSH 1.419 06/16/2017   No results found for: HGBA1C No results found for: CHOL, HDL, LDLCALC, LDLDIRECT, TRIG, CHOLHDL  Significant Diagnostic Results in last 30 days:  No results found.  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

## 2017-11-10 DIAGNOSIS — M810 Age-related osteoporosis without current pathological fracture: Secondary | ICD-10-CM | POA: Diagnosis not present

## 2017-11-15 ENCOUNTER — Non-Acute Institutional Stay (SKILLED_NURSING_FACILITY): Payer: Medicare Other | Admitting: Internal Medicine

## 2017-11-15 ENCOUNTER — Encounter: Payer: Self-pay | Admitting: Internal Medicine

## 2017-11-15 DIAGNOSIS — F419 Anxiety disorder, unspecified: Secondary | ICD-10-CM

## 2017-11-15 DIAGNOSIS — G20 Parkinson's disease: Secondary | ICD-10-CM

## 2017-11-15 DIAGNOSIS — F0281 Dementia in other diseases classified elsewhere with behavioral disturbance: Secondary | ICD-10-CM

## 2017-11-15 DIAGNOSIS — G20A1 Parkinson's disease without dyskinesia, without mention of fluctuations: Secondary | ICD-10-CM

## 2017-11-15 DIAGNOSIS — R5383 Other fatigue: Secondary | ICD-10-CM

## 2017-11-15 DIAGNOSIS — F02818 Dementia in other diseases classified elsewhere, unspecified severity, with other behavioral disturbance: Secondary | ICD-10-CM

## 2017-11-15 NOTE — Progress Notes (Signed)
Location:   [facility] Room Number: 152/D Place of Service:  SNF (31) Provider:Anjali Chales Abrahams MD  Estevan Oaks, NP  Patient Care Team: Estevan Oaks, NP as PCP - General (Nurse Practitioner)  Extended Emergency Contact Information Primary Emergency Contact: Ciampa,[name] Address: [address]          [place], [place] [address] [place] Home Phone: [phone] Relation: Spouse Secondary Emergency Contact: Simien,[name] Address: [address]          [place], [place] [address] [place] Home Phone: [phone] Mobile Phone: [phone] Relation: Son  Code Status:  DNR Goals of care: Advanced Directive information Advanced Directives 11/25/2017  Does Patient Have a Medical Advance Directive? Yes  Type of Advance Directive Out of facility DNR (pink MOST or yellow form)  Does patient want to make changes to medical advance directive? No - Patient declined  Copy of Healthcare Power of Attorney in Chart? No - copy requested  Would patient like information on creating a medical advance directive? -  Pre-existing out of facility DNR order (yellow form or pink MOST form) -     Chief Complaint  Patient presents with  . Acute Visit    Patient is being seen for  Lethargy in the morning    HPI:  Pt is a 84 y.o. female seen today for an acute visit for for Lethargy in the Morning  Patient has a history of hypertension, Parkinson disease with hallucinations, recurrent falls, COPD,Humerus fracture 09/18, Low back pain due to compression Fracture, Osteoporosis, Primary open angle Glaucoma. Bilateral Closed Pubic Rami fracture, h/o Fecal Impaction, Dementia and Anxiety Patient was recently started on low-dose of BuSpar 2.5 mg in the morning. She also gets Ativan and Melatonin at night for her anxiety. Patient did wake up and was at her baseline when I saw her. Patient has no fever or chills.  She told me that she sometimes  feels dizzy when Nurses try to get her up too quickly in the morning.   Past Medical History:  Diagnosis Date  . Arthritis    osteoporosis  . Back pain   . COPD (chronic obstructive pulmonary disease) (HCC)   . Dementia   . Gait instability   . Glaucoma   . Glaucoma   . Hypertension   . Neck pain   . Parkinson's disease (HCC)   . Tremor    Past Surgical History:  Procedure Laterality Date  . CATARACT EXTRACTION    . EYE SURGERY    . TONSILLECTOMY    . TUBAL LIGATION      Allergies  Allergen Reactions  . Hydrochlorothiazide Other (See Comments)    Reaction:  Dizziness   . Lisinopril Cough  . Other Other (See Comments)    Pt states that she is allergic to multiple BP meds -- does not recall the names.   Reaction:  Dizziness   . Spironolactone Other (See Comments)    Reaction:  Blurred vision and sweating of feet     Outpatient Encounter Medications as of 11/15/2017  Medication Sig  . acetaminophen (TYLENOL) 325 MG tablet Take 650 mg by mouth every 6 (six) hours as needed.  Marland Kitchen amLODipine (NORVASC) 2.5 MG tablet Take 2.5 mg by mouth daily.  Marland Kitchen aspirin EC 81 MG tablet Take 81 mg by mouth daily.  Marland Kitchen atenolol (TENORMIN) 50 MG tablet Take 25 mg by mouth daily.   . bisacodyl (DULCOLAX) 10 MG suppository Place 1 suppository (  10 mg total) rectally as needed for moderate constipation.  . budesonide-formoterol (SYMBICORT) 160-4.5 MCG/ACT inhaler Inhale 2 puffs into the lungs 2 (two) times daily.  . busPIRone (BUSPAR) 5 MG tablet Take 2.5 mg by mouth daily.  Marland Kitchen. docusate sodium (COLACE) 100 MG capsule Take 1 capsule (100 mg total) by mouth 2 (two) times daily.  Marland Kitchen. linaclotide (LINZESS) 145 MCG CAPS capsule Take 1 capsule (145 mcg total) by mouth daily before breakfast.  . Melatonin 3 MG TABS Take 3 mg by mouth at bedtime.  . Menthol (ICY HOT ADVANCED RELIEF) 7.5 % PTCH Apply 1 patch topically every 12 (twelve) hours.  . nitroGLYCERIN (NITROSTAT) 0.4 MG SL tablet Place 1 tablet under the  tongue as directed. Place 1 tablet under the tongue every five minutes as needed for emergency chest pain  . polyethylene glycol (MIRALAX / GLYCOLAX) packet Take 17 g by mouth daily as needed.  . Polyvinyl Alcohol-Povidone PF 1.4-0.6 % SOLN Apply 1 drop to eye 2 (two) times daily as needed.  . rotigotine (NEUPRO) 4 MG/24HR Place 1 patch onto the skin daily.  . timolol (BETIMOL) 0.5 % ophthalmic solution Place 1 drop into both eyes 2 (two) times daily.  . [DISCONTINUED] LORazepam (ATIVAN) 0.5 MG tablet Take 0.5 tablets (0.25 mg total) by mouth 2 (two) times daily.   No facility-administered encounter medications on file as of 11/15/2017.      Review of Systems  Unable to perform ROS: Dementia     There is no immunization history on file for this patient. Pertinent  Health Maintenance Due  Topic Date Due  . PNA vac Low Risk Adult (1 of 2 - PCV13) 12/10/2017 (Originally 01/02/1996)  . INFLUENZA VACCINE  12/16/2017 (Originally 11/11/2017)  . DEXA SCAN  Completed   Fall Risk  08/05/2017 07/18/2015 03/19/2015 02/12/2015  Falls in the past year? Yes Yes No Yes  Number falls in past yr: 2 or more 1 - 2 or more  Injury with Fall? Yes No - Yes  Comment hip fracture - - left lower leg   Risk for fall due to : - (No Data) - -  Risk for fall due to: Comment - accidently hit by door that pushed her down - -   Functional Status Survey:    Vitals:   11/25/17 1706  BP: 105/60  Pulse: 70  Resp: 18  Temp: 98.7 F (37.1 C)   There is no height or weight on file to calculate BMI. Physical Exam  Constitutional: She appears well-developed.  HENT:  Head: Normocephalic.  Mouth/Throat: Oropharynx is clear and moist.  Eyes: Pupils are equal, round, and reactive to light.  Neck: Neck supple.  Cardiovascular: Normal rate and regular rhythm.  Pulmonary/Chest: Effort normal and breath sounds normal. No stridor. No respiratory distress. She has no wheezes.  Abdominal: Soft. Bowel sounds are normal. She  exhibits no distension. There is no tenderness. There is no guarding.  Musculoskeletal:  Trace edema Bilateral  Neurological: She is alert.  Her Mental status was at her Baseline. Follows Commands and has no Focal deficits  Skin: Skin is warm and dry.  Psychiatric: She has a normal mood and affect. Her behavior is normal.    Labs reviewed: Recent Labs    06/29/17 0742  08/27/17 0725 09/17/17 0740 11/16/17 0540  NA 134*   < > 136 137 139  K 3.8   < > 3.1* 3.9 4.1  CL 97*   < > 104 105 106  CO2  24   < > 25 27 28   GLUCOSE 144*   < > 93 99 93  BUN 18   < > 13 14 22   CREATININE 0.58   < > 0.57 0.55 0.65  CALCIUM 8.8*   < > 8.3* 9.1 9.1  MG 1.9  --   --   --   --    < > = values in this interval not displayed.   Recent Labs    06/29/17 1058 08/20/17 1807 08/21/17 0553  AST 20 27 21   ALT 10* 16 13*  ALKPHOS 86 89 67  BILITOT 0.8 1.0 0.8  PROT 7.9 8.4* 6.6  ALBUMIN 3.1* 3.5 2.7*   Recent Labs    05/11/17 0709  06/29/17 1058 08/20/17 1807 08/21/17 0553 08/27/17 0725 11/16/17 0540  WBC 11.3*   < > 10.1 10.4 8.3 7.8 8.9  NEUTROABS 8.2*  --  7.1 6.4  --   --   --   HGB 11.0*   < > 12.3 13.4 11.4* 12.0 13.3  HCT 35.1*   < > 39.3 41.9 35.9* 37.5 40.2  MCV 96.2   < > 91.8 90.1 90.4 88.2 96.6  PLT 770*   < > 360 325 299 323 260   < > = values in this interval not displayed.   Lab Results  Component Value Date   TSH 1.419 06/16/2017   No results found for: HGBA1C No results found for: CHOL, HDL, LDLCALC, LDLDIRECT, TRIG, CHOLHDL  Significant Diagnostic Results in last 30 days:  No results found.  Assessment/Plan Lethargy in the Morning Will discontinue her PM Ativan Repeat BMP and CBC to Rule out infectious Etiology Continue Buspar 2.5 mg in AM.for her morning Anxiety Will continue Melatonin at night for Insomnia    Family/ staff Communication:   Labs/tests ordered:   Total time spent in this patient care encounter was 25_ minutes; greater than 50% of  the visit spent counseling patient, reviewing records , Labs and coordinating care for problems addressed at this encounter.

## 2017-11-16 ENCOUNTER — Other Ambulatory Visit: Payer: Self-pay

## 2017-11-16 ENCOUNTER — Other Ambulatory Visit (HOSPITAL_COMMUNITY)
Admission: RE | Admit: 2017-11-16 | Discharge: 2017-11-16 | Disposition: A | Discharge status: Home / Self Care | Payer: Medicare Other | Source: Skilled Nursing Facility | Attending: Internal Medicine | Admitting: Internal Medicine

## 2017-11-16 DIAGNOSIS — I10 Essential (primary) hypertension: Secondary | ICD-10-CM | POA: Diagnosis not present

## 2017-11-16 LAB — CBC
HEMATOCRIT: 40.2 % (ref 36.0–46.0)
HEMOGLOBIN: 13.3 g/dL (ref 12.0–15.0)
MCH: 32 pg (ref 26.0–34.0)
MCHC: 33.1 g/dL (ref 30.0–36.0)
MCV: 96.6 fL (ref 78.0–100.0)
Platelets: 260 10*3/uL (ref 150–400)
RBC: 4.16 MIL/uL (ref 3.87–5.11)
RDW: 13.9 % (ref 11.5–15.5)
WBC: 8.9 10*3/uL (ref 4.0–10.5)

## 2017-11-16 LAB — BASIC METABOLIC PANEL
ANION GAP: 5 (ref 5–15)
BUN: 22 mg/dL (ref 8–23)
CO2: 28 mmol/L (ref 22–32)
Calcium: 9.1 mg/dL (ref 8.9–10.3)
Chloride: 106 mmol/L (ref 98–111)
Creatinine, Ser: 0.65 mg/dL (ref 0.44–1.00)
GFR calc Af Amer: >60 mL/min (ref >60)
GFR calc non Af Amer: >60 mL/min (ref >60)
GLUCOSE: 93 mg/dL (ref 70–99)
POTASSIUM: 4.1 mmol/L (ref 3.5–5.1)
Sodium: 139 mmol/L (ref 135–145)

## 2017-11-16 LAB — HM DEXA SCAN

## 2017-11-16 MED ORDER — LORAZEPAM 0.5 MG PO TABS: 15.0000 mg | ORAL_TABLET | Freq: Two times a day (BID) | ORAL | 0 refills | Status: DC

## 2017-11-16 NOTE — Telephone Encounter (Signed)
RX Fax for [facility]@ [phone]  

## 2017-11-25 ENCOUNTER — Encounter: Payer: Self-pay | Admitting: Internal Medicine

## 2017-11-25 ENCOUNTER — Non-Acute Institutional Stay (SKILLED_NURSING_FACILITY): Payer: Medicare Other | Admitting: Internal Medicine

## 2017-11-25 DIAGNOSIS — F419 Anxiety disorder, unspecified: Secondary | ICD-10-CM | POA: Diagnosis not present

## 2017-11-25 DIAGNOSIS — M810 Age-related osteoporosis without current pathological fracture: Secondary | ICD-10-CM | POA: Diagnosis not present

## 2017-11-25 NOTE — Progress Notes (Signed)
Location:    [facility] Room Number: 152/D Place of Service:  SNF [phone](31) Provider: Einar CrowAnjali Gupta MD  Sandra Davis, Sandra Ann, NP  Patient Care Team: Sandra Davis, Sandra Ann, NP as PCP - General (Nurse Practitioner)  Extended Emergency Contact Information Primary Emergency Contact: Sandra,Davis Address: [address]          [place], [place] [address] [place] Home Phone: [phone] Relation: Spouse Secondary Emergency Contact: Davis,Sandra Address: [address]          [place], [place] [address] [place] Home Phone: [phone] Mobile Phone: [phone] Relation: Son  Code Status:  DNR Goals of care: Advanced Directive information Advanced Directives 11/25/2017  Does Patient Have a Medical Advance Directive? Yes  Type of Advance Directive Out of facility DNR (pink MOST or yellow form)  Does patient want to make changes to medical advance directive? No - Patient declined  Copy of Healthcare Power of Attorney in Chart? No - copy requested  Would patient like information on creating a medical advance directive? -  Pre-existing out of facility DNR order (yellow form or pink MOST form) -     Chief Complaint  Patient presents with  . Acute Visit    Patient is being seen for Osteoporosis/Anxiety    HPI:  Pt is a 84 y.o. female seen today for Anxiety and Osteoporosis  Patient has a history of hypertension, Parkinson disease with hallucinations, recurrent falls, COPD,Humerus fracture 09/18, Low back pain due to compression Fracture, Osteoporosis, Primary open angle Glaucoma. Bilateral Closed Pubic Rami fracture, h/o Fecal Impaction, Dementia and Anxiety  Patient was seen today for follow-up of her anxiety and lethargy.  She also had a bone density done which showed T score of -4.4 Patient was started on low-dose of BuSpar and she has done well since then.  She does get anxious around 4 to 5:00 in the evening.  But is doing better  in the morning with BuSpar. Patient did not have any complaints. No New Nursing issues Past Medical History:  Diagnosis Date  . Arthritis    osteoporosis  . Back pain   . COPD (chronic obstructive pulmonary disease) (HCC)   . Dementia   . Gait instability   . Glaucoma   . Glaucoma   . Hypertension   . Neck pain   . Parkinson's disease (HCC)   . Tremor    Past Surgical History:  Procedure Laterality Date  . CATARACT EXTRACTION    . EYE SURGERY    . TONSILLECTOMY    . TUBAL LIGATION      Allergies  Allergen Reactions  . Hydrochlorothiazide Other (See Comments)    Reaction:  Dizziness   . Lisinopril Cough  . Other Other (See Comments)    Pt states that she is allergic to multiple BP meds -- does not recall the names.   Reaction:  Dizziness   . Spironolactone Other (See Comments)    Reaction:  Blurred vision and sweating of feet     Outpatient Encounter Medications as of 11/25/2017  Medication Sig  . acetaminophen (TYLENOL) 325 MG tablet Take 650 mg by mouth every 6 (six) hours as needed.  Marland Kitchen. amLODipine (NORVASC) 2.5 MG tablet Take 2.5 mg by mouth daily.  Marland Kitchen. aspirin EC 81 MG tablet Take 81 mg by mouth daily.  Marland Kitchen. atenolol (TENORMIN) 50 MG tablet Take 25 mg by mouth daily.   . bisacodyl (DULCOLAX) 10 MG suppository Place 1 suppository (10 mg  total) rectally as needed for moderate constipation.  . budesonide-formoterol (SYMBICORT) 160-4.5 MCG/ACT inhaler Inhale 2 puffs into the lungs 2 (two) times daily.  . busPIRone (BUSPAR) 5 MG tablet Take 2.5 mg by mouth daily.  Marland Kitchen. docusate sodium (COLACE) 100 MG capsule Take 1 capsule (100 mg total) by mouth 2 (two) times daily.  Marland Kitchen. linaclotide (LINZESS) 145 MCG CAPS capsule Take 1 capsule (145 mcg total) by mouth daily before breakfast.  . LORazepam (ATIVAN) 0.5 MG tablet Take 0.25 mg by mouth daily.  . Melatonin 3 MG TABS Take 3 mg by mouth at bedtime.  . Menthol (ICY HOT ADVANCED RELIEF) 7.5 % PTCH Apply 1 patch topically every 12  (twelve) hours.  . nitroGLYCERIN (NITROSTAT) 0.4 MG SL tablet Place 1 tablet under the tongue as directed. Place 1 tablet under the tongue every five minutes as needed for emergency chest pain  . polyethylene glycol (MIRALAX / GLYCOLAX) packet Take 17 g by mouth daily as needed.  . Polyvinyl Alcohol-Povidone PF 1.4-0.6 % SOLN Apply 1 drop to eye 2 (two) times daily as needed.  . rotigotine (NEUPRO) 4 MG/24HR Place 1 patch onto the skin daily.  . timolol (BETIMOL) 0.5 % ophthalmic solution Place 1 drop into both eyes 2 (two) times daily.  . [DISCONTINUED] LORazepam (ATIVAN) 0.5 MG tablet Take 30 tablets (15 mg total) by mouth 2 (two) times daily. (Patient taking differently: Take 0.25 mg by mouth 2 (two) times daily. )   No facility-administered encounter medications on file as of 11/25/2017.      Review of Systems  Unable to perform ROS: Dementia     There is no immunization history on file for this patient. Pertinent  Health Maintenance Due  Topic Date Due  . PNA vac Low Risk Adult (1 of 2 - PCV13) 12/10/2017 (Originally 01/02/1996)  . INFLUENZA VACCINE  12/16/2017 (Originally 11/11/2017)  . DEXA SCAN  Completed   Fall Risk  08/05/2017 07/18/2015 03/19/2015 02/12/2015  Falls in the past year? Yes Yes No Yes  Number falls in past yr: 2 or more 1 - 2 or more  Injury with Fall? Yes No - Yes  Comment hip fracture - - left lower leg   Risk for fall due to : - (No Data) - -  Risk for fall due to: Comment - accidently hit by door that pushed her down - -   Functional Status Survey:    There were no vitals filed for this visit. There is no height or weight on file to calculate BMI. Physical Exam  Constitutional: She appears well-developed.  HENT:  Head: Normocephalic.  Mouth/Throat: Oropharynx is clear and moist.  Eyes: Pupils are equal, round, and reactive to light.  Neck: Neck supple.  Cardiovascular: Normal rate and regular rhythm.  Pulmonary/Chest: Effort normal and breath sounds  normal. No stridor. No respiratory distress. She has no wheezes.  Abdominal: Soft. Bowel sounds are normal. She exhibits no distension. There is no tenderness. There is no guarding.  Musculoskeletal:  Trace edema Bilateral  Neurological: She is alert.  Her Mental status was at her Baseline. Follows Commands and has no Focal deficits  Skin: Skin is warm and dry.  Psychiatric: She has a normal mood and affect. Her behavior is normal.    Labs reviewed: Recent Labs    06/29/17 0742  08/27/17 0725 09/17/17 0740 11/16/17 0540  NA 134*   < > 136 137 139  K 3.8   < > 3.1* 3.9 4.1  CL  97*   < > 104 105 106  CO2 24   < > 25 27 28   GLUCOSE 144*   < > 93 99 93  BUN 18   < > 13 14 22   CREATININE 0.58   < > 0.57 0.55 0.65  CALCIUM 8.8*   < > 8.3* 9.1 9.1  MG 1.9  --   --   --   --    < > = values in this interval not displayed.   Recent Labs    06/29/17 1058 08/20/17 1807 08/21/17 0553  AST 20 27 21   ALT 10* 16 13*  ALKPHOS 86 89 67  BILITOT 0.8 1.0 0.8  PROT 7.9 8.4* 6.6  ALBUMIN 3.1* 3.5 2.7*   Recent Labs    05/11/17 0709  06/29/17 1058 08/20/17 1807 08/21/17 0553 08/27/17 0725 11/16/17 0540  WBC 11.3*   < > 10.1 10.4 8.3 7.8 8.9  NEUTROABS 8.2*  --  7.1 6.4  --   --   --   HGB 11.0*   < > 12.3 13.4 11.4* 12.0 13.3  HCT 35.1*   < > 39.3 41.9 35.9* 37.5 40.2  MCV 96.2   < > 91.8 90.1 90.4 88.2 96.6  PLT 770*   < > 360 325 299 323 260   < > = values in this interval not displayed.   Lab Results  Component Value Date   TSH 1.419 06/16/2017   No results found for: HGBA1C No results found for: CHOL, HDL, LDLCALC, LDLDIRECT, TRIG, CHOLHDL  Significant Diagnostic Results in last 30 days:  No results found.  Assessment/Plan  Osteoporosis D/W husband. She was on Fosamax before . Will restart her on that Also Check Vit D Anxiety Doing better on Buspar Discontinue Ativan Increased Buspar to 2.5 mg BID   Family/ staff Communication:   Labs/tests ordered:

## 2017-11-26 ENCOUNTER — Encounter (HOSPITAL_COMMUNITY)
Admission: RE | Admit: 2017-11-26 | Discharge: 2017-11-26 | Disposition: A | Discharge status: Home / Self Care | Payer: Medicare Other | Source: Skilled Nursing Facility | Attending: Internal Medicine | Admitting: Internal Medicine

## 2017-11-26 DIAGNOSIS — M810 Age-related osteoporosis without current pathological fracture: Secondary | ICD-10-CM | POA: Insufficient documentation

## 2017-11-27 LAB — VITAMIN D 25 HYDROXY (VIT D DEFICIENCY, FRACTURES): VIT D 25 HYDROXY: 38.7 ng/mL (ref 30.0–100.0)

## 2017-12-23 ENCOUNTER — Non-Acute Institutional Stay (SKILLED_NURSING_FACILITY): Payer: Medicare Other | Admitting: Internal Medicine

## 2017-12-23 ENCOUNTER — Encounter: Payer: Self-pay | Admitting: Internal Medicine

## 2017-12-23 DIAGNOSIS — F419 Anxiety disorder, unspecified: Secondary | ICD-10-CM | POA: Diagnosis not present

## 2017-12-23 DIAGNOSIS — I10 Essential (primary) hypertension: Secondary | ICD-10-CM

## 2017-12-23 DIAGNOSIS — G20 Parkinson's disease: Secondary | ICD-10-CM

## 2017-12-23 DIAGNOSIS — M810 Age-related osteoporosis without current pathological fracture: Secondary | ICD-10-CM

## 2017-12-23 DIAGNOSIS — J410 Simple chronic bronchitis: Secondary | ICD-10-CM | POA: Diagnosis not present

## 2017-12-23 NOTE — Progress Notes (Signed)
Location:    [facility] Room Number: 152/D Place of Service:  SNF (31) Provider: Edmon Crape PA-C  Estevan Oaks, NP  Patient Care Team: Estevan Oaks, NP as PCP - General (Nurse Practitioner)  Extended Emergency Contact Information Primary Emergency Contact: Rey,[name] Address: [address]          [place], [place] [address] [place] of [place] Home Phone: [phone] Relation: Spouse Secondary Emergency Contact: Schill,[name] Address: [address]          [place], [place] [address] [place] of [place] Home Phone: [phone] Mobile Phone: [phone] Relation: Son  Code Status:  DNR Goals of care: Advanced Directive information Advanced Directives 12/23/2017  Does Patient Have a Medical Advance Directive? Yes  Type of Advance Directive Out of facility DNR (pink MOST or yellow form)  Does patient want to make changes to medical advance directive? No - Patient declined  Copy of Healthcare Power of Attorney in Chart? No - copy requested  Would patient like information on creating a medical advance directive? -  Pre-existing out of facility DNR order (yellow form or pink MOST form) -     Chief Complaint  Patient presents with  . Medical Management of Chronic Issues    Patient is being seen for routine visit of medical management , due T-dap   Medical management of chronic medical conditions that include history of Parkinson's disease with hallucinations- hypertension- COPD- history of chronic low back pain with compression fractures-as well as osteoporosis- primary open angle glaucoma.--As well as dementia  Previous history of pubic rami fractures- as well as anxiety- HPI:  Pt is a 84 y.o. female seen today for medical management of chronic diseases.  As noted above.  She was recently seen for recent concerns of osteoporosis-DEXA scan did show it T score of -4.4- she has been started on Fosamax and appears to be tolerating  this well- we also checked a vitamin D level which was within normal range at 38.7.  She also has had some anxiety-but this appears to be under somewhat better control on BuSpar- 2.5 mg twice daily- at one point she was on Ativan at night but apparently had some increased a.m. lethargy and this was discontinued apparently this has improved.  Currently she is sitting in her wheelchair comfortably- she often stays near the nurses station and appears to do well with socializing with staff-she also has a very supportive husband who visits her daily later in the day.  Her weight is stable in the mid 90s.  Appetite appears to be fairly steady.  In regards to Parkinson's disease with hallucinations she continues on neuro pro patch.  She also has a history of hypertension on atenolol and low-dose Norvasc clear she has variable blood pressures I see listed once recently 150/77- I actually see 1 with a systolic of 180- however manually today I got closer to 100/44.  At this point will monitor and will write an order to hold the low dose Norvasc for systolic less than 105.  In regards to her pain management with history of low back pain and compression fractures she is receiving Tylenol as well as icy hot topical- I think there are concerns with oversedation with stronger pain medications- nonetheless this appears to be controlled at this point.  She also has a significant history of fecal impaction but this appears to have stabilized on Linzess as well as Colace she received Colace twice a day.  Currently again  she is sitting her wheelchair comfortably she is pleasant cooperative does not really complain of pain  In regards to dementia again this appears to be stabilized with supportive care-she does have a very supportive husband as well     Past Medical History:  Diagnosis Date  . Arthritis    osteoporosis  . Back pain   . COPD (chronic obstructive pulmonary disease) (HCC)   . Dementia   .  Gait instability   . Glaucoma   . Glaucoma   . Hypertension   . Neck pain   . Parkinson's disease (HCC)   . Tremor    Past Surgical History:  Procedure Laterality Date  . CATARACT EXTRACTION    . EYE SURGERY    . TONSILLECTOMY    . TUBAL LIGATION      Allergies  Allergen Reactions  . Hydrochlorothiazide Other (See Comments)    Reaction:  Dizziness   . Lisinopril Cough  . Other Other (See Comments)    Pt states that she is allergic to multiple BP meds -- does not recall the names.   Reaction:  Dizziness   . Spironolactone Other (See Comments)    Reaction:  Blurred vision and sweating of feet     Outpatient Encounter Medications as of 12/23/2017  Medication Sig  . acetaminophen (TYLENOL) 325 MG tablet Take 650 mg by mouth every 6 (six) hours as needed.  Marland Kitchen. alendronate (FOSAMAX) 70 MG tablet Take 70 mg by mouth once a week. Take with a full glass of water on an empty stomach.  Marland Kitchen. amLODipine (NORVASC) 2.5 MG tablet Take 2.5 mg by mouth daily.  Marland Kitchen. aspirin EC 81 MG tablet Take 81 mg by mouth daily.  Marland Kitchen. atenolol (TENORMIN) 50 MG tablet Take 25 mg by mouth daily.   . bisacodyl (DULCOLAX) 10 MG suppository Place 1 suppository (10 mg total) rectally as needed for moderate constipation.  . budesonide-formoterol (SYMBICORT) 160-4.5 MCG/ACT inhaler Inhale 2 puffs into the lungs 2 (two) times daily.  . busPIRone (BUSPAR) 5 MG tablet Take 2.5 mg by mouth daily.  Marland Kitchen. docusate sodium (COLACE) 100 MG capsule Take 1 capsule (100 mg total) by mouth 2 (two) times daily.  Marland Kitchen. linaclotide (LINZESS) 145 MCG CAPS capsule Take 1 capsule (145 mcg total) by mouth daily before breakfast.  . Melatonin 3 MG TABS Take 3 mg by mouth at bedtime.  . Menthol (ICY HOT ADVANCED RELIEF) 7.5 % PTCH Apply 1 patch topically every 12 (twelve) hours.  . nitroGLYCERIN (NITROSTAT) 0.4 MG SL tablet Place 1 tablet under the tongue as directed. Place 1 tablet under the tongue every five minutes as needed for emergency chest pain    . polyethylene glycol (MIRALAX / GLYCOLAX) packet Take 17 g by mouth daily as needed.  . Polyvinyl Alcohol-Povidone PF 1.4-0.6 % SOLN Apply 1 drop to eye 2 (two) times daily as needed.  . rotigotine (NEUPRO) 4 MG/24HR Place 1 patch onto the skin daily.  . timolol (BETIMOL) 0.5 % ophthalmic solution Place 1 drop into both eyes 2 (two) times daily.  . [DISCONTINUED] LORazepam (ATIVAN) 0.5 MG tablet Take 0.25 mg by mouth daily.   No facility-administered encounter medications on file as of 12/23/2017.      Review of Systems   This is fairly difficult to obtain secondary to dementia but when asked she does not appear to complain of pain- appears to be doing pretty well here-- nursing staff does not report really any recent acute issues  There is  no immunization history on file for this patient. Pertinent  Health Maintenance Due  Topic Date Due  . INFLUENZA VACCINE  01/22/2018 (Originally 11/11/2017)  . PNA vac Low Risk Adult (1 of 2 - PCV13) 01/22/2018 (Originally 01/02/1996)  . DEXA SCAN  Completed   Fall Risk  08/05/2017 07/18/2015 03/19/2015 02/12/2015  Falls in the past year? Yes Yes No Yes  Number falls in past yr: 2 or more 1 - 2 or more  Injury with Fall? Yes No - Yes  Comment hip fracture - - left lower leg   Risk for fall due to : - (No Data) - -  Risk for fall due to: Comment - accidently hit by door that pushed her down - -   Functional Status Survey:    Vitals:   12/23/17 1431  BP: (!) 150/77  Pulse: 70  She is afebrile respirations are 16  Weight is stable at 95 pounds  Physical Exam   In general this is a pleasant elderly female in no distress she is cooperative.  Her skin is warm and dry she does have a neuro pro patch applied the back of her right shoulder.  Eyes sclera and conjunctive are clear visual acuity appears to be intact.  Oropharynx is clear mucous membranes appear fairly moist.  Chest is clear to auscultation with somewhat shallow air entry with  history of kyphosis.  Heart is regular rate and rhythm without murmur gallop or rub she has very mild lower extremity edema.  Abdomen is soft nontender with active bowel sounds.  Musculoskeletal does move all extremities x4 with generalized weakness which is not new-   neurologic she is grossly intact she does speak- but has limited verbalizations-continues with bilateral upper extremity tremors most noticeable of her hands.  Psych she is alert she is pleasant follow simple verbal commands said you are welcome when I thanked her for let me examine her.    Labs reviewed: Recent Labs    06/29/17 0742  08/27/17 0725 09/17/17 0740 11/16/17 0540  NA 134*   < > 136 137 139  K 3.8   < > 3.1* 3.9 4.1  CL 97*   < > 104 105 106  CO2 24   < > 25 27 28   GLUCOSE 144*   < > 93 99 93  BUN 18   < > 13 14 22   CREATININE 0.58   < > 0.57 0.55 0.65  CALCIUM 8.8*   < > 8.3* 9.1 9.1  MG 1.9  --   --   --   --    < > = values in this interval not displayed.   Recent Labs    06/29/17 1058 08/20/17 1807 08/21/17 0553  AST 20 27 21   ALT 10* 16 13*  ALKPHOS 86 89 67  BILITOT 0.8 1.0 0.8  PROT 7.9 8.4* 6.6  ALBUMIN 3.1* 3.5 2.7*   Recent Labs    05/11/17 0709  06/29/17 1058 08/20/17 1807 08/21/17 0553 08/27/17 0725 11/16/17 0540  WBC 11.3*   < > 10.1 10.4 8.3 7.8 8.9  NEUTROABS 8.2*  --  7.1 6.4  --   --   --   HGB 11.0*   < > 12.3 13.4 11.4* 12.0 13.3  HCT 35.1*   < > 39.3 41.9 35.9* 37.5 40.2  MCV 96.2   < > 91.8 90.1 90.4 88.2 96.6  PLT 770*   < > 360 325 299 323 260   < > =  values in this interval not displayed.   Lab Results  Component Value Date   TSH 1.419 06/16/2017   No results found for: HGBA1C No results found for: CHOL, HDL, LDLCALC, LDLDIRECT, TRIG, CHOLHDL  Significant Diagnostic Results in last 30 days:  No results found.  Assessment/Plan  #1 history of Parkinson's disease with hallucinations- this appears to be fairly stabilized she does have the neuro  pro-patch and appears to have tolerated this okay- at this point will monitor.  2.  History of low back pain with compression fractures as well as a history of osteoporosis- she has been started on Fosamax with a T score of -4.44.  Continues on Tylenol as well as icy hot for pain and this appears to be fairly stable as well although certainly with her frailty she is at risk.  3.-  Hypertension-she has quite a bit of variability in her blood pressures will order blood pressure checks bid for now to see where we stand and will write an order to hold her Norvasc for systolic less than 105- would like to see what the consistency is here of her elevations and occasional depressions  #4- history of constipation apparently this has stabilized on the Linzess as well as Colace.  5.  History of COPD she is on Symbicort twice daily to my knowledge this is not been an issue in some time.  6.  History of humerus fracture again she continues on Tylenol and IcyHot for pain control this appears to have stabilized at one point appeared to be a significant issue I believe but this appears to be under better control as far as pain wise.  7.-  History of primary open angle glaucoma she continues on timolol eyedrops  #8 history of anxiety- she appears to be doing well she is now on BuSpar 2.5 mg twice daily Ativan has been discontinued- nursing does not report increased lethargy apparently this was an issue with the Ativan.  9.-History of insomnia she continues on melatonin at night.  [id number]

## 2017-12-27 ENCOUNTER — Encounter: Payer: Self-pay | Admitting: Internal Medicine

## 2017-12-27 ENCOUNTER — Non-Acute Institutional Stay (SKILLED_NURSING_FACILITY): Payer: Medicare Other | Admitting: Internal Medicine

## 2017-12-27 DIAGNOSIS — K625 Hemorrhage of anus and rectum: Secondary | ICD-10-CM

## 2017-12-27 DIAGNOSIS — R197 Diarrhea, unspecified: Secondary | ICD-10-CM | POA: Diagnosis not present

## 2017-12-27 HISTORY — DX: Hemorrhage of anus and rectum: K62.5

## 2017-12-27 NOTE — Progress Notes (Signed)
Location:    [facility] Room Number: 152/D Place of Service:  SNF [phone]) Provider:  Jeanine Luz, NP  Patient Care Team: Estevan Oaks, NP as PCP - General (Nurse Practitioner)  Extended Emergency Contact Information Primary Emergency Contact: Sterne,[name] Address: [address]          [place], [place] [address] [place] of [place] Home Phone: [phone] Relation: Spouse Secondary Emergency Contact: Umana,[name] Address: [address]          [place], [place] [address] [place] Home Phone: [phone] Mobile Phone: [phone] Relation: Son  Code Status:  DNR Goals of care: Advanced Directive information Advanced Directives 12/27/2017  Does Patient Have a Medical Advance Directive? Yes  Type of Advance Directive Out of facility DNR (pink MOST or yellow form)  Does patient want to make changes to medical advance directive? No - Patient declined  Copy of Healthcare Power of Attorney in Chart? No - copy requested  Would patient like information on creating a medical advance directive? -  Pre-existing out of facility DNR order (yellow form or pink MOST form) -     Chief Complaint  Patient presents with  . Acute Visit    Patient is being seen for blood in stool ( hematochezia)    HPI:  Pt is a 84 y.o. female seen today for an acute visit for apparently an episode of bloody stool this morning  History is somewhat vague but apparently was thought she had some bloody stool by the overnight nurse.  This was apparently a one-time episode.  Patient is a long-term resident of the facility with a history of Parkinson's disease as well as hypertension COPD chronic low back pain with compression fractures as well as significant osteoporosis and primary open angle glaucoma and dementia.  She appears to be doing relatively well- per discussion with her husband as well as review in Epic I could not really see a  significant GI or bleeding history.  Vital signs are stable she is a poor historian but does not appear to be complaining of any dysuria or abdominal discomfort at this time.  There have been no further overt bloody stools per nursing    Past Medical History:  Diagnosis Date  . Arthritis    osteoporosis  . Back pain   . COPD (chronic obstructive pulmonary disease) (HCC)   . Dementia   . Gait instability   . Glaucoma   . Glaucoma   . Hypertension   . Neck pain   . Parkinson's disease (HCC)   . Tremor    Past Surgical History:  Procedure Laterality Date  . CATARACT EXTRACTION    . EYE SURGERY    . TONSILLECTOMY    . TUBAL LIGATION      Allergies  Allergen Reactions  . Hydrochlorothiazide Other (See Comments)    Reaction:  Dizziness   . Lisinopril Cough  . Other Other (See Comments)    Pt states that she is allergic to multiple BP meds -- does not recall the names.   Reaction:  Dizziness   . Spironolactone Other (See Comments)    Reaction:  Blurred vision and sweating of feet     Outpatient Encounter Medications as of 12/27/2017  Medication Sig  . acetaminophen (TYLENOL) 325 MG tablet Take 650 mg by mouth every 6 (six) hours as needed.  Marland Kitchen alendronate (FOSAMAX) 70 MG tablet Take 70 mg by mouth once a week. Take  with a full glass of water on an empty stomach.  Marland Kitchen amLODipine (NORVASC) 2.5 MG tablet Take 2.5 mg by mouth daily.  Marland Kitchen aspirin EC 81 MG tablet Take 81 mg by mouth daily.  Marland Kitchen atenolol (TENORMIN) 50 MG tablet Take 25 mg by mouth daily.   . bisacodyl (DULCOLAX) 10 MG suppository Place 1 suppository (10 mg total) rectally as needed for moderate constipation.  . budesonide-formoterol (SYMBICORT) 160-4.5 MCG/ACT inhaler Inhale 2 puffs into the lungs 2 (two) times daily.  . busPIRone (BUSPAR) 5 MG tablet Take 2.5 mg by mouth 2 (two) times daily.   Marland Kitchen docusate sodium (COLACE) 100 MG capsule Take 1 capsule (100 mg total) by mouth 2 (two) times daily.  Marland Kitchen linaclotide  (LINZESS) 145 MCG CAPS capsule Take 1 capsule (145 mcg total) by mouth daily before breakfast.  . Melatonin 3 MG TABS Take 3 mg by mouth at bedtime.  . Menthol (ICY HOT ADVANCED RELIEF) 7.5 % PTCH Apply 1 patch topically every 12 (twelve) hours.  . nitroGLYCERIN (NITROSTAT) 0.4 MG SL tablet Place 1 tablet under the tongue as directed. Place 1 tablet under the tongue every five minutes as needed for emergency chest pain  . Polyethyl Glycol-Propyl Glycol (SYSTANE ULTRA) 0.4-0.3 % SOLN Place 1 drop into both eyes every 6 (six) hours as needed.  . polyethylene glycol (MIRALAX / GLYCOLAX) packet Take 17 g by mouth daily as needed.  . Polyvinyl Alcohol-Povidone PF 1.4-0.6 % SOLN Apply 1 drop to eye 2 (two) times daily as needed.  . rotigotine (NEUPRO) 4 MG/24HR Place 1 patch onto the skin daily.  . timolol (BETIMOL) 0.5 % ophthalmic solution Place 1 drop into both eyes 2 (two) times daily.   No facility-administered encounter medications on file as of 12/27/2017.     Review of Systems  This is limited since patient is a poor historian but nursing does not report any further episodes- she is not really complaining of any abdominal pain or dysuria from what I can tell- does not complain of any chest pain.    There is no immunization history on file for this patient. Pertinent  Health Maintenance Due  Topic Date Due  . INFLUENZA VACCINE  01/22/2018 (Originally 11/11/2017)  . PNA vac Low Risk Adult (1 of 2 - PCV13) 01/22/2018 (Originally 01/02/1996)  . DEXA SCAN  Completed   Fall Risk  08/05/2017 07/18/2015 03/19/2015 02/12/2015  Falls in the past year? Yes Yes No Yes  Number falls in past yr: 2 or more 1 - 2 or more  Injury with Fall? Yes No - Yes  Comment hip fracture - - left lower leg   Risk for fall due to : - (No Data) - -  Risk for fall due to: Comment - accidently hit by door that pushed her down - -   Functional Status Survey:    Vitals:   12/27/17 1209  BP: 133/69  Pulse: 70  Resp: 18    Temp: 98.6 F (37 C)  TempSrc: Oral    Physical Exam  In general this is a frail elderly female in no distress lying comfortably in bed.  Her skin is warm and dry.  Eyes visual acuity appears to be intact.  Oropharynx is clear mucous membranes moist  Chest is clear to auscultation with somewhat shallow air entry respiratory effort is somewhat poor.  Heart is regular rate and rhythm without murmur gallop or rub she does not really have much significant edema.  Her abdomen is  soft nontender with positive bowel sounds.  Rectal-she does have a moderate amount of somewhat loose stool- I did do a digital exam-I could not see any overt bleeding and it did test negative for occult bleeding  Could not really see any hemorrhoids although the amount of stool may have obscured complete assessment  GU could not really appreciate any vaginal bleeding.    Musculoskeletal does move all extremities x4 with generalized frailty and weakness at baseline.  Neurologic she is alert she does have a history of upper extremity tremors  Psych she is alert does follow simple verbal commands she is not agitated with exam.     Labs reviewed: Recent Labs    06/29/17 0742  08/27/17 0725 09/17/17 0740 11/16/17 0540  NA 134*   < > 136 137 139  K 3.8   < > 3.1* 3.9 4.1  CL 97*   < > 104 105 106  CO2 24   < > 25 27 28   GLUCOSE 144*   < > 93 99 93  BUN 18   < > 13 14 22   CREATININE 0.58   < > 0.57 0.55 0.65  CALCIUM 8.8*   < > 8.3* 9.1 9.1  MG 1.9  --   --   --   --    < > = values in this interval not displayed.   Recent Labs    06/29/17 1058 08/20/17 1807 08/21/17 0553  AST 20 27 21   ALT 10* 16 13*  ALKPHOS 86 89 67  BILITOT 0.8 1.0 0.8  PROT 7.9 8.4* 6.6  ALBUMIN 3.1* 3.5 2.7*   Recent Labs    05/11/17 0709  06/29/17 1058 08/20/17 1807 08/21/17 0553 08/27/17 0725 11/16/17 0540  WBC 11.3*   < > 10.1 10.4 8.3 7.8 8.9  NEUTROABS 8.2*  --  7.1 6.4  --   --   --   HGB 11.0*    < > 12.3 13.4 11.4* 12.0 13.3  HCT 35.1*   < > 39.3 41.9 35.9* 37.5 40.2  MCV 96.2   < > 91.8 90.1 90.4 88.2 96.6  PLT 770*   < > 360 325 299 323 260   < > = values in this interval not displayed.   Lab Results  Component Value Date   TSH 1.419 06/16/2017   No results found for: HGBA1C No results found for: CHOL, HDL, LDLCALC, LDLDIRECT, TRIG, CHOLHDL  Significant Diagnostic Results in last 30 days:  No results found.  Assessment/Plan  #1 possible rectal bleeding-again history here is somewhat vague- she had a significant amount of stool this afternoon with no evidence of bleeding and apparently she had another earlier bowel movement as well that did not show evidence.  I could not really appreciate vaginal bleeding.  Guaiac testing this afternoon was negative.  At this point will continue to guaiac stools x3.  Also will update labs tomorrow to ensure stability of her hemoglobin.  I also discussed this with her husband- I did discuss the option of GI follow-up with possibly colonoscopy although in my opinion she would not be a very good candidate with her frailty-and he did express agreement-at this point will awaitt labs tomorrow.  Consider a PPI and holding aspirin if there areany further episodes of bleeding or guaiac positive stools or drop in hemoglobin  I do note she does have a history of fecal impaction is on Linzess and appears to have had good results with this- the stool did  not appear specifically loose but did have some liquidity will write order to C. difficile any loose stools as well  [id number]

## 2017-12-28 ENCOUNTER — Encounter (HOSPITAL_COMMUNITY)
Admission: RE | Admit: 2017-12-28 | Discharge: 2017-12-28 | Disposition: A | Discharge status: Home / Self Care | Payer: Medicare Other | Source: Skilled Nursing Facility | Attending: Internal Medicine | Admitting: Internal Medicine

## 2017-12-28 DIAGNOSIS — M545 Low back pain: Secondary | ICD-10-CM | POA: Insufficient documentation

## 2017-12-28 DIAGNOSIS — R350 Frequency of micturition: Secondary | ICD-10-CM | POA: Insufficient documentation

## 2017-12-28 DIAGNOSIS — R300 Dysuria: Secondary | ICD-10-CM | POA: Insufficient documentation

## 2017-12-28 LAB — CBC WITH DIFFERENTIAL/PLATELET
Basophils Absolute: 0.1 10*3/uL (ref 0.0–0.1)
Basophils Relative: 1 %
Eosinophils Absolute: 0.9 10*3/uL — ABNORMAL HIGH (ref 0.0–0.7)
Eosinophils Relative: 11 %
HCT: 39.8 % (ref 36.0–46.0)
HEMOGLOBIN: 13 g/dL (ref 12.0–15.0)
LYMPHS ABS: 2.2 10*3/uL (ref 0.7–4.0)
LYMPHS PCT: 25 %
MCH: 31.5 pg (ref 26.0–34.0)
MCHC: 32.7 g/dL (ref 30.0–36.0)
MCV: 96.4 fL (ref 78.0–100.0)
Monocytes Absolute: 1.1 10*3/uL — ABNORMAL HIGH (ref 0.1–1.0)
Monocytes Relative: 13 %
NEUTROS PCT: 50 %
Neutro Abs: 4.5 10*3/uL (ref 1.7–7.7)
Platelets: 255 10*3/uL (ref 150–400)
RBC: 4.13 MIL/uL (ref 3.87–5.11)
RDW: 13.3 % (ref 11.5–15.5)
WBC: 8.8 10*3/uL (ref 4.0–10.5)

## 2017-12-28 LAB — BASIC METABOLIC PANEL
ANION GAP: 5 (ref 5–15)
BUN: 21 mg/dL (ref 8–23)
CHLORIDE: 105 mmol/L (ref 98–111)
CO2: 28 mmol/L (ref 22–32)
Calcium: 8.7 mg/dL — ABNORMAL LOW (ref 8.9–10.3)
Creatinine, Ser: 0.66 mg/dL (ref 0.44–1.00)
GFR calc Af Amer: >60 mL/min (ref >60)
GFR calc non Af Amer: >60 mL/min (ref >60)
GLUCOSE: 94 mg/dL (ref 70–99)
POTASSIUM: 4 mmol/L (ref 3.5–5.1)
Sodium: 138 mmol/L (ref 135–145)

## 2018-01-18 ENCOUNTER — Non-Acute Institutional Stay (SKILLED_NURSING_FACILITY): Payer: Medicare Other | Admitting: Internal Medicine

## 2018-01-18 ENCOUNTER — Encounter: Payer: Self-pay | Admitting: Internal Medicine

## 2018-01-18 DIAGNOSIS — I10 Essential (primary) hypertension: Secondary | ICD-10-CM

## 2018-01-18 DIAGNOSIS — F02818 Dementia in other diseases classified elsewhere, unspecified severity, with other behavioral disturbance: Secondary | ICD-10-CM

## 2018-01-18 DIAGNOSIS — F419 Anxiety disorder, unspecified: Secondary | ICD-10-CM | POA: Diagnosis not present

## 2018-01-18 DIAGNOSIS — J410 Simple chronic bronchitis: Secondary | ICD-10-CM | POA: Diagnosis not present

## 2018-01-18 DIAGNOSIS — K625 Hemorrhage of anus and rectum: Secondary | ICD-10-CM | POA: Diagnosis not present

## 2018-01-18 DIAGNOSIS — M810 Age-related osteoporosis without current pathological fracture: Secondary | ICD-10-CM | POA: Diagnosis not present

## 2018-01-18 DIAGNOSIS — F0281 Dementia in other diseases classified elsewhere with behavioral disturbance: Secondary | ICD-10-CM | POA: Diagnosis not present

## 2018-01-18 DIAGNOSIS — K5900 Constipation, unspecified: Secondary | ICD-10-CM | POA: Diagnosis not present

## 2018-01-18 DIAGNOSIS — G20A1 Parkinson's disease without dyskinesia, without mention of fluctuations: Secondary | ICD-10-CM

## 2018-01-18 DIAGNOSIS — G20 Parkinson's disease: Secondary | ICD-10-CM

## 2018-01-18 NOTE — Progress Notes (Signed)
Location:  [facility]   Place of Service:  SNF [phone]) Provider:    Estevan Oaks, NP  Patient Care Team: Estevan Oaks, NP as PCP - General (Nurse Practitioner)  Extended Emergency Contact Information Primary Emergency Contact: Ales,[name] Address: [address]          [place], [place] [address] [place] Home Phone: [phone] Relation: Spouse Secondary Emergency Contact: Christine,[name] Address: [address]          [place], [place] [address] [place] Home Phone: [phone] Mobile Phone: [phone] Relation: Son  Code Status:  DNR Goals of care: Advanced Directive information Advanced Directives 12/27/2017  Does Patient Have a Medical Advance Directive? Yes  Type of Advance Directive Out of facility DNR (pink MOST or yellow form)  Does patient want to make changes to medical advance directive? No - Patient declined  Copy of Healthcare Power of Attorney in Chart? No - copy requested  Would patient like information on creating a medical advance directive? -  Pre-existing out of facility DNR order (yellow form or pink MOST form) -     Chief Complaint  Patient presents with  . Medical Management of Chronic Issues    HPI:  Pt is a 84 y.o. female seen today for medical management of chronic diseases.    Patient has a history of hypertension, Parkinson disease with hallucinations, Recurrent falls, COPD,Humerus fracture 09/18, Low back pain due to compression Fracture, Osteoporosis, Primary open angle Glaucoma. Bilateral Closed Pubic Rami fracture, h/o Fecal Impaction, Dementia and Anxiety Patient is doing well in facility. She still yells during day time for her husband but responds ot redirection. Had no new Nursing issues. Her Weight is stable at 95 lbs. She is mostly sit in her Wheelchair but walk around with [employer] and walker.    Past Medical History:  Diagnosis Date  . Arthritis    osteoporosis  . Back  pain   . COPD (chronic obstructive pulmonary disease) (HCC)   . Dementia (HCC)   . Gait instability   . Glaucoma   . Glaucoma   . Hypertension   . Neck pain   . Parkinson's disease (HCC)   . Tremor    Past Surgical History:  Procedure Laterality Date  . CATARACT EXTRACTION    . EYE SURGERY    . TONSILLECTOMY    . TUBAL LIGATION      Allergies  Allergen Reactions  . Hydrochlorothiazide Other (See Comments)    Reaction:  Dizziness   . Lisinopril Cough  . Other Other (See Comments)    Pt states that she is allergic to multiple BP meds -- does not recall the names.   Reaction:  Dizziness   . Spironolactone Other (See Comments)    Reaction:  Blurred vision and sweating of feet     Outpatient Encounter Medications as of 01/18/2018  Medication Sig  . acetaminophen (TYLENOL) 325 MG tablet Take 650 mg by mouth every 6 (six) hours as needed.  Marland Kitchen alendronate (FOSAMAX) 70 MG tablet Take 70 mg by mouth once a week. Take with a full glass of water on an empty stomach.  Marland Kitchen amLODipine (NORVASC) 2.5 MG tablet Take 2.5 mg by mouth daily.  Marland Kitchen aspirin EC 81 MG tablet Take 81 mg by mouth daily.  Marland Kitchen atenolol (TENORMIN) 50 MG tablet Take 25 mg by mouth daily.   . budesonide-formoterol (SYMBICORT) 160-4.5 MCG/ACT inhaler Inhale 2 puffs into the lungs 2 (two) times  daily.  . busPIRone (BUSPAR) 5 MG tablet Take 2.5 mg by mouth 2 (two) times daily.   Marland Kitchen docusate sodium (COLACE) 100 MG capsule Take 1 capsule (100 mg total) by mouth 2 (two) times daily.  Marland Kitchen linaclotide (LINZESS) 145 MCG CAPS capsule Take 1 capsule (145 mcg total) by mouth daily before breakfast.  . Melatonin 3 MG TABS Take 3 mg by mouth at bedtime.  . Menthol (ICY HOT ADVANCED RELIEF) 7.5 % PTCH Apply 1 patch topically every 12 (twelve) hours.  . nitroGLYCERIN (NITROSTAT) 0.4 MG SL tablet Place 1 tablet under the tongue as directed. Place 1 tablet under the tongue every five minutes as needed for emergency chest pain  . Polyethyl  Glycol-Propyl Glycol (SYSTANE ULTRA) 0.4-0.3 % SOLN Place 1 drop into both eyes every 6 (six) hours as needed.  . polyethylene glycol (MIRALAX / GLYCOLAX) packet Take 17 g by mouth daily as needed.  . Polyvinyl Alcohol-Povidone PF 1.4-0.6 % SOLN Apply 1 drop to eye 2 (two) times daily as needed.  . rotigotine (NEUPRO) 4 MG/24HR Place 1 patch onto the skin daily.  . timolol (BETIMOL) 0.5 % ophthalmic solution Place 1 drop into both eyes 2 (two) times daily.  . bisacodyl (DULCOLAX) 10 MG suppository Place 1 suppository (10 mg total) rectally as needed for moderate constipation. (Patient not taking: Reported on 01/18/2018)   No facility-administered encounter medications on file as of 01/18/2018.     Review of Systems  Unable to perform ROS: Dementia     There is no immunization history on file for this patient. Pertinent  Health Maintenance Due  Topic Date Due  . INFLUENZA VACCINE  01/22/2018 (Originally 11/11/2017)  . PNA vac Low Risk Adult (1 of 2 - PCV13) 01/22/2018 (Originally 01/02/1996)  . DEXA SCAN  Completed   Fall Risk  08/05/2017 07/18/2015 03/19/2015 02/12/2015  Falls in the past year? Yes Yes No Yes  Number falls in past yr: 2 or more 1 - 2 or more  Injury with Fall? Yes No - Yes  Comment hip fracture - - left lower leg   Risk for fall due to : - (No Data) - -  Risk for fall due to: Comment - accidently hit by door that pushed her down - -   Functional Status Survey:    Vitals:   01/18/18 1618  BP: 116/73  Pulse: 68  Resp: 18  Temp: 98.1 F (36.7 C)  SpO2: 94%   There is no height or weight on file to calculate BMI. Physical Exam  Constitutional: She appears well-developed.  HENT:  Head: Normocephalic.  Mouth/Throat: Oropharynx is clear and moist.  Eyes: Pupils are equal, round, and reactive to light.  Neck: Neck supple.  Cardiovascular: Normal rate and regular rhythm.  Pulmonary/Chest: Effort normal and breath sounds normal. No stridor. No respiratory distress. She  has no wheezes.  Abdominal: Soft. Bowel sounds are normal. She exhibits no distension. There is no tenderness. There is no guarding.  Musculoskeletal: She exhibits no edema.  Neurological: She is alert.  Her Mental status was at her Baseline. Follows Commands and has no Focal deficits Walks with the walker and Mild assist. She answers Appropriately. Follows simple commands  Skin: Skin is warm and dry.  Psychiatric: She has a normal mood and affect. Her behavior is normal.    Labs reviewed: Recent Labs    06/29/17 0742  09/17/17 0740 11/16/17 0540 12/28/17 0800  NA 134*   < > 137 139 138  K 3.8   < > 3.9 4.1 4.0  CL 97*   < > 105 106 105  CO2 24   < > 27 28 28   GLUCOSE 144*   < > 99 93 94  BUN 18   < > 14 22 21   CREATININE 0.58   < > 0.55 0.65 0.66  CALCIUM 8.8*   < > 9.1 9.1 8.7*  MG 1.9  --   --   --   --    < > = values in this interval not displayed.   Recent Labs    06/29/17 1058 08/20/17 1807 08/21/17 0553  AST 20 27 21   ALT 10* 16 13*  ALKPHOS 86 89 67  BILITOT 0.8 1.0 0.8  PROT 7.9 8.4* 6.6  ALBUMIN 3.1* 3.5 2.7*   Recent Labs    06/29/17 1058 08/20/17 1807  08/27/17 0725 11/16/17 0540 12/28/17 0847  WBC 10.1 10.4   < > 7.8 8.9 8.8  NEUTROABS 7.1 6.4  --   --   --  4.5  HGB 12.3 13.4   < > 12.0 13.3 13.0  HCT 39.3 41.9   < > 37.5 40.2 39.8  MCV 91.8 90.1   < > 88.2 96.6 96.4  PLT 360 325   < > 323 260 255   < > = values in this interval not displayed.   Lab Results  Component Value Date   TSH 1.419 06/16/2017   No results found for: HGBA1C No results found for: CHOL, HDL, LDLCALC, LDLDIRECT, TRIG, CHOLHDL  Significant Diagnostic Results in last 30 days:  No results found.  Assessment/Plan  Essential hypertension Has been Controlled on Toprol and Norvasc  Rectal bleeding Was noticed to have an Episode of Blood Per rectum once few weeks ago Was d/w the husband who does not want anything Aggressive right now. CBC has been  Stable  Parkinson's disease  Stable on Nupro patch  Dementia due to Parkinson's disease On Low Dose of Buspar Ativan was stopped  Age-related osteoporosis with h/o Fractures with T score of 4.4 in 08/19 On Fosamax  Constipation with h/o Impaction Stable on Linzess and Miralax  Anxiety Stable on Buspar  COPD Continue on Symbicort   Family/ staff Communication:   Labs/tests ordered:

## 2018-01-26 DIAGNOSIS — I739 Peripheral vascular disease, unspecified: Secondary | ICD-10-CM | POA: Diagnosis not present

## 2018-01-26 DIAGNOSIS — B351 Tinea unguium: Secondary | ICD-10-CM | POA: Diagnosis not present

## 2018-01-26 DIAGNOSIS — L603 Nail dystrophy: Secondary | ICD-10-CM | POA: Diagnosis not present

## 2018-02-07 DIAGNOSIS — H401134 Primary open-angle glaucoma, bilateral, indeterminate stage: Secondary | ICD-10-CM | POA: Diagnosis not present

## 2018-02-07 DIAGNOSIS — Z7951 Long term (current) use of inhaled steroids: Secondary | ICD-10-CM | POA: Diagnosis not present

## 2018-02-07 DIAGNOSIS — Z961 Presence of intraocular lens: Secondary | ICD-10-CM | POA: Diagnosis not present

## 2018-02-07 DIAGNOSIS — H04123 Dry eye syndrome of bilateral lacrimal glands: Secondary | ICD-10-CM | POA: Diagnosis not present

## 2018-02-15 ENCOUNTER — Encounter: Payer: Self-pay | Admitting: Internal Medicine

## 2018-02-15 ENCOUNTER — Non-Acute Institutional Stay (SKILLED_NURSING_FACILITY): Payer: Medicare Other | Admitting: Internal Medicine

## 2018-02-15 DIAGNOSIS — R1310 Dysphagia, unspecified: Secondary | ICD-10-CM | POA: Diagnosis not present

## 2018-02-15 DIAGNOSIS — I10 Essential (primary) hypertension: Secondary | ICD-10-CM | POA: Diagnosis not present

## 2018-02-15 DIAGNOSIS — M810 Age-related osteoporosis without current pathological fracture: Secondary | ICD-10-CM | POA: Diagnosis not present

## 2018-02-15 NOTE — Progress Notes (Signed)
Location:    [facility] Room Number: 152/D Place of Service:  SNF [phone]) Provider: Einar Crow MD  Estevan Oaks, NP  Patient Care Team: Estevan Oaks, NP as PCP - General (Nurse Practitioner)  Extended Emergency Contact Information Primary Emergency Contact: Macwilliams,[name] Address: [address]          [place], [place] [address] [place] of [place] Home Phone: [phone] Relation: Spouse Secondary Emergency Contact: Dezeeuw,[name] Address: [address]          [place], [place] [address] [place] Home Phone: [phone] Mobile Phone: [phone] Relation: Son  Code Status:  DNR Goals of care: Advanced Directive information Advanced Directives 02/15/2018  Does Patient Have a Medical Advance Directive? Yes  Type of Advance Directive Out of facility DNR (pink MOST or yellow form)  Does patient want to make changes to medical advance directive? No - Patient declined  Copy of Healthcare Power of Attorney in Chart? No - copy requested  Would patient like information on creating a medical advance directive? -  Pre-existing out of facility DNR order (yellow form or pink MOST form) -     Chief Complaint  Patient presents with  . Acute Visit    Swallowing    HPI:  Pt is a 84 y.o. female seen today for an acute visit for Difficulty in  Swallowing  Patient has a history of hypertension, Parkinson disease with hallucinations, Recurrent falls, COPD,Humerus fracture 09/18, Low back pain due to compression Fracture, Osteoporosis, Primary open angle Glaucoma. Bilateral Closed Pubic Rami fracture, h/o Fecal Impaction, Dementiaand Anxiety  Patient was seen at the request of her husband.  Per her husband patient is having problems with swallowing.  He also thinks she is having chest pains with food start getting stuck.  She has increased dryness of her Mouth She herself denies any issues.  And nurses have not notices any dysphagia or  choking. Patient continues to walk around with minimum assist.  Her weight is stable at 95 pounds.  Past Medical History:  Diagnosis Date  . Arthritis    osteoporosis  . Back pain   . COPD (chronic obstructive pulmonary disease) (HCC)   . Dementia (HCC)   . Gait instability   . Glaucoma   . Glaucoma   . Hypertension   . Neck pain   . Parkinson's disease (HCC)   . Tremor    Past Surgical History:  Procedure Laterality Date  . CATARACT EXTRACTION    . EYE SURGERY    . TONSILLECTOMY    . TUBAL LIGATION      Allergies  Allergen Reactions  . Hydrochlorothiazide Other (See Comments)    Reaction:  Dizziness   . Lisinopril Cough  . Other Other (See Comments)    Pt states that she is allergic to multiple BP meds -- does not recall the names.   Reaction:  Dizziness   . Spironolactone Other (See Comments)    Reaction:  Blurred vision and sweating of feet     Outpatient Encounter Medications as of 02/15/2018  Medication Sig  . acetaminophen (TYLENOL) 325 MG tablet Take 650 mg by mouth every 6 (six) hours as needed.  Marland Kitchen alendronate (FOSAMAX) 70 MG tablet Take 70 mg by mouth once a week. Take with a full glass of water on an empty stomach.  Marland Kitchen amLODipine (NORVASC) 2.5 MG tablet Take 2.5 mg by mouth daily.  Marland Kitchen aspirin EC 81 MG tablet Take 81 mg by  mouth daily.  Marland Kitchen atenolol (TENORMIN) 50 MG tablet Take 25 mg by mouth daily.   . bisacodyl (DULCOLAX) 10 MG suppository Place 1 suppository (10 mg total) rectally as needed for moderate constipation.  . budesonide-formoterol (SYMBICORT) 160-4.5 MCG/ACT inhaler Inhale 2 puffs into the lungs 2 (two) times daily.  . busPIRone (BUSPAR) 5 MG tablet Take 2.5 mg by mouth 2 (two) times daily.   . Carboxymethylcellulose Sod PF 0.5 % SOLN Place 1 drop into both eyes every 6 (six) hours as needed.  . docusate sodium (COLACE) 100 MG capsule Take 1 capsule (100 mg total) by mouth 2 (two) times daily.  Marland Kitchen linaclotide (LINZESS) 145 MCG CAPS capsule Take 1  capsule (145 mcg total) by mouth daily before breakfast.  . Melatonin 3 MG TABS Take 3 mg by mouth at bedtime.  . Menthol (ICY HOT ADVANCED RELIEF) 7.5 % PTCH Apply 1 patch topically every 12 (twelve) hours.  . Multiple Vitamin (MULTIVITAMIN) tablet Take 1 tablet by mouth daily.  . nitroGLYCERIN (NITROSTAT) 0.4 MG SL tablet Place 1 tablet under the tongue as directed. Place 1 tablet under the tongue every five minutes as needed for emergency chest pain  . polyethylene glycol (MIRALAX / GLYCOLAX) packet Take 17 g by mouth daily as needed.  . Polyvinyl Alcohol-Povidone PF 1.4-0.6 % SOLN Apply 1 drop to eye 2 (two) times daily as needed.  . rotigotine (NEUPRO) 4 MG/24HR Place 1 patch onto the skin daily.  . timolol (BETIMOL) 0.5 % ophthalmic solution Place 1 drop into both eyes 2 (two) times daily.  . [DISCONTINUED] Polyethyl Glycol-Propyl Glycol (SYSTANE ULTRA) 0.4-0.3 % SOLN Place 1 drop into both eyes every 6 (six) hours as needed.   No facility-administered encounter medications on file as of 02/15/2018.      Review of Systems  Constitutional: Negative.   HENT: Negative.   Respiratory: Negative.   Cardiovascular: Negative.   Genitourinary: Negative.   Musculoskeletal: Positive for arthralgias and myalgias.  Neurological: Negative.   Psychiatric/Behavioral: Positive for agitation.     There is no immunization history on file for this patient. Pertinent  Health Maintenance Due  Topic Date Due  . PNA vac Low Risk Adult (2 of 2 - PPSV23) 01/20/2019  . INFLUENZA VACCINE  Completed  . DEXA SCAN  Completed   Fall Risk  08/05/2017 07/18/2015 03/19/2015 02/12/2015  Falls in the past year? Yes Yes No Yes  Number falls in past yr: 2 or more 1 - 2 or more  Injury with Fall? Yes No - Yes  Comment hip fracture - - left lower leg   Risk for fall due to : - (No Data) - -  Risk for fall due to: Comment - accidently hit by door that pushed her down - -   Functional Status Survey:    Vitals:    02/15/18 1007  BP: (!) 90/52  Pulse: (!) 57  Resp: 20  Temp: 98.7 F (37.1 C)  TempSrc: Oral  SpO2: 95%   There is no height or weight on file to calculate BMI. Physical Exam  Constitutional: She appears well-developed.  HENT:  Head: Normocephalic.  Mouth/Throat: Oropharynx is clear and moist.  Eyes: Pupils are equal, round, and reactive to light.  Neck: Neck supple.  Cardiovascular: Normal rate and regular rhythm.  Pulmonary/Chest: Effort normal and breath sounds normal. No stridor. No respiratory distress. She has no wheezes.  Abdominal: Soft. Bowel sounds are normal. She exhibits no distension. There is no tenderness. There is  no guarding.  Musculoskeletal: She exhibits no edema.  Neurological: She is alert.  Her Mental status was at her Baseline. Follows Commands and has no Focal deficits Walks with the walker and Mild assist. She answers Appropriately. Follows simple commands  Skin: Skin is warm and dry.  Psychiatric: She has a normal mood and affect. Her behavior is normal.    Labs reviewed: Recent Labs    06/29/17 0742  09/17/17 0740 11/16/17 0540 12/28/17 0800  NA 134*   < > 137 139 138  K 3.8   < > 3.9 4.1 4.0  CL 97*   < > 105 106 105  CO2 24   < > 27 28 28   GLUCOSE 144*   < > 99 93 94  BUN 18   < > 14 22 21   CREATININE 0.58   < > 0.55 0.65 0.66  CALCIUM 8.8*   < > 9.1 9.1 8.7*  MG 1.9  --   --   --   --    < > = values in this interval not displayed.   Recent Labs    06/29/17 1058 08/20/17 1807 08/21/17 0553  AST 20 27 21   ALT 10* 16 13*  ALKPHOS 86 89 67  BILITOT 0.8 1.0 0.8  PROT 7.9 8.4* 6.6  ALBUMIN 3.1* 3.5 2.7*   Recent Labs    06/29/17 1058 08/20/17 1807  08/27/17 0725 11/16/17 0540 12/28/17 0847  WBC 10.1 10.4   < > 7.8 8.9 8.8  NEUTROABS 7.1 6.4  --   --   --  4.5  HGB 12.3 13.4   < > 12.0 13.3 13.0  HCT 39.3 41.9   < > 37.5 40.2 39.8  MCV 91.8 90.1   < > 88.2 96.6 96.4  PLT 360 325   < > 323 260 255   < > = values in this  interval not displayed.   Lab Results  Component Value Date   TSH 1.419 06/16/2017   No results found for: HGBA1C No results found for: CHOL, HDL, LDLCALC, LDLDIRECT, TRIG, CHOLHDL  Significant Diagnostic Results in last 30 days:  No results found.  Assessment/Plan  ? Dysphagia Not Sure if patient is having this problem.  The nurses have not reported anything to me. Will have speech evaluate her We will also started on Protonix Patient's husband thinks that she is having side effects to Fosamax which is possible.  This will discontinue Fosamax again.  Will discuss with pharmacy if patient can qualify for Prolia. Hypertension Patient's blood pressure has been running low low recently We will discontinue Norvasc Continue Toprol Age-related osteoporosis with h/o Fractures with T score of 4.4 in 08/19 Discontinue Fosamax at request of her husband Will consider starting Prolia   Family/ staff Communication:   Labs/tests ordered:   Total time spent in this patient care encounter was 25_ minutes; greater than 50% of the visit spent counseling patient, reviewing records , Labs and coordinating care for problems addressed at this encounter.

## 2018-02-25 ENCOUNTER — Other Ambulatory Visit: Payer: Self-pay

## 2018-02-25 ENCOUNTER — Emergency Department (HOSPITAL_COMMUNITY): Payer: Medicare Other

## 2018-02-25 ENCOUNTER — Inpatient Hospital Stay
Admission: RE | Admit: 2018-02-25 | Discharge: 2019-07-13 | Disposition: E | Payer: Medicare Other | Source: Ambulatory Visit | Attending: Internal Medicine | Admitting: Internal Medicine

## 2018-02-25 ENCOUNTER — Emergency Department (HOSPITAL_COMMUNITY)
Admission: EM | Admit: 2018-02-25 | Discharge: 2018-02-25 | Disposition: A | Discharge status: Home / Self Care | Payer: Medicare Other | Attending: Emergency Medicine | Admitting: Emergency Medicine

## 2018-02-25 ENCOUNTER — Encounter: Payer: Self-pay | Admitting: Internal Medicine

## 2018-02-25 ENCOUNTER — Non-Acute Institutional Stay (SKILLED_NURSING_FACILITY): Payer: Medicare Other | Admitting: Internal Medicine

## 2018-02-25 ENCOUNTER — Encounter (HOSPITAL_COMMUNITY): Payer: Self-pay | Admitting: Emergency Medicine

## 2018-02-25 DIAGNOSIS — I10 Essential (primary) hypertension: Secondary | ICD-10-CM | POA: Insufficient documentation

## 2018-02-25 DIAGNOSIS — R55 Syncope and collapse: Secondary | ICD-10-CM

## 2018-02-25 DIAGNOSIS — R4020 Unspecified coma: Secondary | ICD-10-CM | POA: Diagnosis not present

## 2018-02-25 DIAGNOSIS — G20 Parkinson's disease: Secondary | ICD-10-CM | POA: Diagnosis not present

## 2018-02-25 DIAGNOSIS — F0390 Unspecified dementia without behavioral disturbance: Secondary | ICD-10-CM | POA: Insufficient documentation

## 2018-02-25 DIAGNOSIS — R531 Weakness: Secondary | ICD-10-CM | POA: Diagnosis not present

## 2018-02-25 DIAGNOSIS — J449 Chronic obstructive pulmonary disease, unspecified: Secondary | ICD-10-CM | POA: Insufficient documentation

## 2018-02-25 DIAGNOSIS — Z79899 Other long term (current) drug therapy: Secondary | ICD-10-CM | POA: Insufficient documentation

## 2018-02-25 DIAGNOSIS — Z7982 Long term (current) use of aspirin: Secondary | ICD-10-CM | POA: Insufficient documentation

## 2018-02-25 HISTORY — DX: Chronic obstructive pulmonary disease, unspecified: J44.9

## 2018-02-25 LAB — COMPREHENSIVE METABOLIC PANEL
ALK PHOS: 56 U/L (ref 38–126)
ALT: 15 U/L (ref 0–44)
AST: 23 U/L (ref 15–41)
Albumin: 3.4 g/dL — ABNORMAL LOW (ref 3.5–5.0)
Anion gap: 5 (ref 5–15)
BILIRUBIN TOTAL: 1.2 mg/dL (ref 0.3–1.2)
BUN: 21 mg/dL (ref 8–23)
CALCIUM: 8.6 mg/dL — AB (ref 8.9–10.3)
CO2: 24 mmol/L (ref 22–32)
CREATININE: 0.78 mg/dL (ref 0.44–1.00)
Chloride: 109 mmol/L (ref 98–111)
GFR calc Af Amer: >60 mL/min (ref >60)
GFR calc non Af Amer: >60 mL/min (ref >60)
GLUCOSE: 89 mg/dL (ref 70–99)
Potassium: 4.1 mmol/L (ref 3.5–5.1)
Sodium: 138 mmol/L (ref 135–145)
TOTAL PROTEIN: 7.4 g/dL (ref 6.5–8.1)

## 2018-02-25 LAB — CBC WITH DIFFERENTIAL/PLATELET
ABS IMMATURE GRANULOCYTES: 0.02 10*3/uL (ref 0.00–0.07)
Basophils Absolute: 0.1 10*3/uL (ref 0.0–0.1)
Basophils Relative: 1 %
EOS ABS: 0.7 10*3/uL — AB (ref 0.0–0.5)
Eosinophils Relative: 8 %
HEMATOCRIT: 41 % (ref 36.0–46.0)
HEMOGLOBIN: 12.9 g/dL (ref 12.0–15.0)
IMMATURE GRANULOCYTES: 0 %
LYMPHS ABS: 2 10*3/uL (ref 0.7–4.0)
LYMPHS PCT: 25 %
MCH: 30.1 pg (ref 26.0–34.0)
MCHC: 31.5 g/dL (ref 30.0–36.0)
MCV: 95.8 fL (ref 80.0–100.0)
MONOS PCT: 12 %
Monocytes Absolute: 1 10*3/uL (ref 0.1–1.0)
NRBC: 0 % (ref 0.0–0.2)
Neutro Abs: 4.1 10*3/uL (ref 1.7–7.7)
Neutrophils Relative %: 54 %
Platelets: 247 10*3/uL (ref 150–400)
RBC: 4.28 MIL/uL (ref 3.87–5.11)
RDW: 13.9 % (ref 11.5–15.5)
WBC: 7.8 10*3/uL (ref 4.0–10.5)

## 2018-02-25 LAB — TROPONIN I

## 2018-02-25 NOTE — Progress Notes (Signed)
Location:    [facility] Room Number: 152/D Place of Service:  SNF [id number]) Provider:  Jeanine Luz, NP  Patient Care Team: Estevan Oaks, NP as PCP - General (Nurse Practitioner)  Extended Emergency Contact Information Primary Emergency Contact: Dante,[name] Address: [address]          [place], [place] [address] [place] Home Phone: [phone] Relation: Spouse Secondary Emergency Contact: Henion,[name] Address: [address]          [place], [place] [address] [place] Home Phone: [phone] Mobile Phone: [phone] Relation: Son  Code Status:  DNR Goals of care: Advanced Directive information Advanced Directives 03/21/18  Does Patient Have a Medical Advance Directive? Yes  Type of Advance Directive Out of facility DNR (pink MOST or yellow form)  Does patient want to make changes to medical advance directive? No - Patient declined  Copy of Healthcare Power of Attorney in Chart? No - copy requested  Would patient like information on creating a medical advance directive? -  Pre-existing out of facility DNR order (yellow form or pink MOST form) -     Chief Complaint  Patient presents with  . Acute Visit    Syncope    HPI:  Pt is a 84 y.o. female seen today for an acute visit for a syncopal episode.  Patient is a long-term resident of facility with a history of Parkinson's disease with hallucinations as well as hypertension COPD a humerus fracture and low back pain because of compression fractures as well as osteoporosis glaucoma history of pelvic fractures fecal impaction dementia and anxiety.  She continues to be quite frail but responsive-however apparently in restorative dining this afternoon she had an episode where she became unresponsive for approximately 30 seconds- according to her aide this was highly unusual for her-apparently after about 30 seconds she came to but was somewhat  confused and complained initially of a headache but she is no longer complaining of this.  When I arrived we did do vital signs which did show hypoxia although her fingers were quite clold so we question the accuracy of this O2 saturation was in the 70s to 80s.  Her pulse was 60 blood pressure was 160/90 blood sugar was 90 she was afebrile.  She was responsive at that point and appeared relatively at her baseline.   Review of systems is somewhat difficult but she is complaining of some pain although this is difficult to localize appears this is more of her left arm but then appears to be more her stomach-again this is somewhat variable.  Oxygen was applied and O2 saturation did rise into the 80s.      Past Medical History:  Diagnosis Date  . Arthritis    osteoporosis  . Back pain   . COPD (chronic obstructive pulmonary disease) (HCC)   . Dementia (HCC)   . Gait instability   . Glaucoma   . Glaucoma   . Hypertension   . Neck pain   . Parkinson's disease (HCC)   . Tremor    Past Surgical History:  Procedure Laterality Date  . CATARACT EXTRACTION    . EYE SURGERY    . TONSILLECTOMY    . TUBAL LIGATION      Allergies  Allergen Reactions  . Hydrochlorothiazide Other (See Comments)    Reaction:  Dizziness   . Lisinopril Cough  . Other Other (See Comments)    Pt states that she  is allergic to multiple BP meds -- does not recall the names.   Reaction:  Dizziness   . Spironolactone Other (See Comments)    Reaction:  Blurred vision and sweating of feet     Outpatient Encounter Medications as of 10/14/2017  Medication Sig  . acetaminophen (TYLENOL) 325 MG tablet Take 650 mg by mouth every 6 (six) hours as needed.  Marland Kitchen. aspirin EC 81 MG tablet Take 81 mg by mouth daily.  Marland Kitchen. atenolol (TENORMIN) 50 MG tablet Take 25 mg by mouth daily.   . bisacodyl (DULCOLAX) 10 MG suppository Place 1 suppository (10 mg total) rectally as needed for moderate constipation.  . busPIRone (BUSPAR)  5 MG tablet Take 2.5 mg by mouth 2 (two) times daily.   . Carboxymethylcellulose Sod PF 0.5 % SOLN Place 1 drop into both eyes every 6 (six) hours as needed.  . docusate sodium (COLACE) 100 MG capsule Take 1 capsule (100 mg total) by mouth 2 (two) times daily.  Marland Kitchen. linaclotide (LINZESS) 145 MCG CAPS capsule Take 1 capsule (145 mcg total) by mouth daily before breakfast.  . Melatonin 3 MG TABS Take 3 mg by mouth at bedtime.  . Menthol (ICY HOT ADVANCED RELIEF) 7.5 % PTCH Apply 1 patch topically every 12 (twelve) hours.  . Multiple Vitamin (MULTIVITAMIN) tablet Take 1 tablet by mouth daily.  . nitroGLYCERIN (NITROSTAT) 0.4 MG SL tablet Place 1 tablet under the tongue as directed. Place 1 tablet under the tongue every five minutes as needed for emergency chest pain  . pantoprazole (PROTONIX) 40 MG tablet Take 40 mg by mouth daily.  . polyethylene glycol (MIRALAX / GLYCOLAX) packet Take 17 g by mouth daily as needed.  . Polyvinyl Alcohol-Povidone PF 1.4-0.6 % SOLN Apply 1 drop to eye 2 (two) times daily as needed.  . rotigotine (NEUPRO) 4 MG/24HR Place 1 patch onto the skin daily.  . timolol (BETIMOL) 0.5 % ophthalmic solution Place 1 drop into both eyes 2 (two) times daily.  Marland Kitchen. tiotropium (SPIRIVA) 18 MCG inhalation capsule Place 18 mcg into inhaler and inhale daily.  . [DISCONTINUED] alendronate (FOSAMAX) 70 MG tablet Take 70 mg by mouth once a week. Take with a full glass of water on an empty stomach.  . [DISCONTINUED] amLODipine (NORVASC) 2.5 MG tablet Take 2.5 mg by mouth daily.  . [DISCONTINUED] budesonide-formoterol (SYMBICORT) 160-4.5 MCG/ACT inhaler Inhale 2 puffs into the lungs 2 (two) times daily.   No facility-administered encounter medications on file as of 10/14/2017.     Review of Systems   As noted above this is difficult she is complaining of somewhat diffuse pain complaints but this is difficult to localize as noted above  There is no immunization history on file for this  patient. Pertinent  Health Maintenance Due  Topic Date Due  . PNA vac Low Risk Adult (2 of 2 - PPSV23) 01/20/2019  . INFLUENZA VACCINE  Completed  . DEXA SCAN  Completed   Fall Risk  08/05/2017 07/18/2015 03/19/2015 02/12/2015  Falls in the past year? Yes Yes No Yes  Number falls in past yr: 2 or more 1 - 2 or more  Injury with Fall? Yes No - Yes  Comment hip fracture - - left lower leg   Risk for fall due to : - (No Data) - -  Risk for fall due to: Comment - accidently hit by door that pushed her down - -   Functional Status Survey:    Vitals:   06/25/17 1246  BP: (!) 160/90  Pulse: 60  Resp: 20  Temp: 97.6 F (36.4 C)  TempSrc: Oral  SpO2: (!) 80%    Physical Exam   In general this is a frail elderly female who appears somewhat lethargic but is responsive.  Her skin is warm and dry she is not diaphoretic.  Eyes she holds her eyes fairly tightly shut-sclera and conjunctive it did appear to be clear.  Oropharynx is clear mucous membranes appear fairly moist her tongue appeared to have appropriate range of motion.  Chest shallow air entry but there is no labored breathing I cannot really appreciate overt congestion.  Heart somewhat distant heart sounds regular rate and rhythm  with pulse around 60.  She does not have significant lower extremity edema.  Her abdomen is soft with slightly hypoactive bowel sounds could not really appreciate acute tenderness here.  Musculoskeletal has general frailty but appears able to move her extremities at baseline is able to grip my fingers bilaterally-does move her lower extremities it appears at baseline is able to raise them against gravity.   Neurologic as noted above could not really appreciate lateralizing findings she does not speak much better speech appears relatively clear and at baseline.  Psych as noted above appears somewhat lethargic but does respond appropriately to verbal commands at baseline.    Labs  reviewed: Recent Labs    06/29/17 0742  09/17/17 0740 11/16/17 0540 12/28/17 0800  NA 134*   < > 137 139 138  K 3.8   < > 3.9 4.1 4.0  CL 97*   < > 105 106 105  CO2 24   < > 27 28 28   GLUCOSE 144*   < > 99 93 94  BUN 18   < > 14 22 21   CREATININE 0.58   < > 0.55 0.65 0.66  CALCIUM 8.8*   < > 9.1 9.1 8.7*  MG 1.9  --   --   --   --    < > = values in this interval not displayed.   Recent Labs    06/29/17 1058 08/20/17 1807 08/21/17 0553  AST 20 27 21   ALT 10* 16 13*  ALKPHOS 86 89 67  BILITOT 0.8 1.0 0.8  PROT 7.9 8.4* 6.6  ALBUMIN 3.1* 3.5 2.7*   Recent Labs    06/29/17 1058 08/20/17 1807  08/27/17 0725 11/16/17 0540 12/28/17 0847  WBC 10.1 10.4   < > 7.8 8.9 8.8  NEUTROABS 7.1 6.4  --   --   --  4.5  HGB 12.3 13.4   < > 12.0 13.3 13.0  HCT 39.3 41.9   < > 37.5 40.2 39.8  MCV 91.8 90.1   < > 88.2 96.6 96.4  PLT 360 325   < > 323 260 255   < > = values in this interval not displayed.   Lab Results  Component Value Date   TSH 1.419 06/16/2017   No results found for: HGBA1C No results found for: CHOL, HDL, LDLCALC, LDLDIRECT, TRIG, CHOLHDL  Significant Diagnostic Results in last 30 days:  No results found.  Assessment/Plan  #1- altered mental status-with syncopal episode- at this point appears somewhat lethargic but relatively at her baseline she does have hypoxia again this is questionable- nursing staff did discuss this with her husband via phone-she does have a MOST form---.  Her husband would like her evaluated in the ER and will send her for evaluation for syncope questionable hypoxia-she does not  appear to be in acute distress per serial exams --  but per staff this is a new presentation  with the syncope.  [id number]

## 2018-02-25 NOTE — ED Triage Notes (Signed)
PT brought over via w/c today by [facility] staff members. Nurse from SNF reports pt was eating lunch today and had a syncopal episode. PT back to her baseline per RN from [facility].

## 2018-02-25 NOTE — ED Provider Notes (Signed)
[facility]GENCY [facility]PARTMENT Provider Note   CSN: [id number] Arrival date & time: 03/07/2018  1302     History   Chief Complaint Chief Complaint  Patient presents with  . Loss of Consciousness    HPI Sandra Davis is a 84 y.o. female.  Patient was at the nursing home and had a syncopal episode.  Patient is back to her normal self now.  Patient has severe dementia and is oriented only to person  The history is provided by the patient, the nursing home and a relative. No language interpreter was used.  Loss of Consciousness   This is a new problem. The current episode started 1 to 2 hours ago. The problem occurs rarely. The problem has been resolved. She lost consciousness for a period of less than one minute. The problem is associated with normal activity. Pertinent negatives include chest pain. She has tried nothing for the symptoms. The treatment provided no relief. Her past medical history does not include CVA.    Past Medical History:  Diagnosis Date  . Arthritis    osteoporosis  . Back pain   . Chronic obstructive pulmonary disease (COPD) (HCC)   . COPD (chronic obstructive pulmonary disease) (HCC)   . Dementia (HCC)   . Gait instability   . Glaucoma   . Glaucoma   . Hypertension   . Neck pain   . Parkinson's disease (HCC)   . Tremor     Patient Active Problem List   Diagnosis Date Noted  . Rectal bleeding 12/27/2017  . Anxiety 11/25/2017  . Palliative care by specialist   . Goals of care, counseling/discussion   . Constipation   . UTI (urinary tract infection) 08/20/2017  . Proctitis 08/20/2017  . Recurrent falls 04/26/2017  . Dementia (HCC) 04/26/2017  . COPD (chronic obstructive pulmonary disease) (HCC) 04/22/2017  . HTN (hypertension) 04/22/2017  . Parkinson's disease (HCC) 04/22/2017  . Glaucoma 04/22/2017  . Osteoporosis 04/22/2017  . Bilateral fracture of pubic rami (HCC) 04/22/2017  . Traumatic closed nondisp fracture of greater tuberosity of  humerus, left, initial encounter 12/08/2016  . Dizziness 10/13/2016    Past Surgical History:  Procedure Laterality Date  . CATARACT EXTRACTION    . EYE SURGERY    . TONSILLECTOMY    . TUBAL LIGATION       OB History    Gravida  5   Para  5   Term  5   Preterm      AB      Living        SAB      TAB      Ectopic      Multiple      Live Births               Home Medications    Prior to Admission medications   Medication Sig Start Date End Date Taking? Authorizing Provider  acetaminophen (TYLENOL) 325 MG tablet Take 650 mg by mouth every 6 (six) hours as needed.   Yes [provider]  alendronate (FOSAMAX) 70 MG tablet Take 70 mg by mouth every Monday. Take with a full glass of water on an empty stomach.   Yes [provider]  aspirin EC 81 MG tablet Take 81 mg by mouth daily.   Yes [provider]  atenolol (TENORMIN) 25 MG tablet Take 25 mg by mouth daily.    Yes [provider]  bisacodyl (DULCOLAX) 10 MG suppository Place 1 suppository (  10 mg total) rectally as needed for moderate constipation. 08/24/17  Yes Erick Blinks, MD  busPIRone (BUSPAR) 5 MG tablet Take 2.5 mg by mouth 2 (two) times daily.    Yes [provider]  Carboxymethylcellulose Sod PF 0.5 % SOLN Place 1 drop into both eyes every 6 (six) hours as needed (for dry eyes).    Yes [provider]  docusate sodium (COLACE) 100 MG capsule Take 1 capsule (100 mg total) by mouth 2 (two) times daily. 08/24/17  Yes Erick Blinks, MD  linaclotide Karlene Einstein) 145 MCG CAPS capsule Take 1 capsule (145 mcg total) by mouth daily before breakfast. 08/25/17  Yes Erick Blinks, MD  Melatonin 3 MG TABS Take 3 mg by mouth at bedtime.   Yes [provider]  Menthol (ICY HOT ADVANCED RELIEF) 7.5 % PTCH Apply 1 patch topically every 12 (twelve) hours.   Yes [provider]  Multiple Vitamin (MULTIVITAMIN) tablet Take 1 tablet by mouth daily.    Yes [provider]  nitroGLYCERIN (NITROSTAT) 0.4 MG SL tablet Place 1 tablet under the tongue as directed. Place 1 tablet under the tongue every five minutes as needed for emergency chest pain 11/16/12  Yes [provider]  pantoprazole (PROTONIX) 40 MG tablet Take 40 mg by mouth daily.   Yes [provider]  polyethylene glycol (MIRALAX / GLYCOLAX) packet Take 17 g by mouth daily as needed. Patient taking differently: Take 17 g by mouth daily as needed for mild constipation or moderate constipation.  08/24/17  Yes Erick Blinks, MD  Polyvinyl Alcohol-Povidone PF 1.4-0.6 % SOLN Apply 1 drop to eye 2 (two) times daily as needed (for dry eyes).    Yes [provider]  rotigotine (NEUPRO) 4 MG/24HR Place 1 patch onto the skin daily.   Yes [provider]  timolol (BETIMOL) 0.5 % ophthalmic solution Place 1 drop into both eyes 2 (two) times daily.   Yes [provider]  tiotropium (SPIRIVA) 18 MCG inhalation capsule Place 18 mcg into inhaler and inhale daily.   Yes [provider]  rotigotine (NEUPRO) 4 MG/24HR Place 1 patch onto the skin daily.    [provider]    Family History Family History  Problem Relation Age of Onset  . Heart failure Mother     Social History Social History   Tobacco Use  . Smoking status: Never Smoker  . Smokeless tobacco: Never Used  Substance Use Topics  . Alcohol use: No  . Drug use: No     Allergies   Hydrochlorothiazide; Lisinopril; Other; and Spironolactone   Review of Systems Review of Systems  Unable to perform ROS: Dementia  Cardiovascular: Positive for syncope. Negative for chest pain.     Physical Exam Updated Vital Signs BP 126/77   Pulse (!) 54   Temp 98.5 F (36.9 C) (Oral)   Resp 15   SpO2 100%   Physical Exam  Constitutional: She appears well-developed.  HENT:  Head: Normocephalic.  Eyes: Conjunctivae and EOM are normal. No scleral icterus.  Neck:  Neck supple. No thyromegaly present.  Cardiovascular: Normal rate and regular rhythm. Exam reveals no gallop and no friction rub.  No murmur heard. Pulmonary/Chest: No stridor. She has no wheezes. She has no rales. She exhibits no tenderness.  Abdominal: She exhibits no distension. There is no tenderness. There is no rebound.  Musculoskeletal: Normal range of motion. She exhibits no edema.  Lymphadenopathy:    She has no cervical adenopathy.  Neurological:  She is alert. She exhibits normal muscle tone. Coordination normal.  Oriented to person only  Skin: No rash noted. No erythema.     ED Treatments / Results  Labs (all labs ordered are listed, but only abnormal results are displayed) Labs Reviewed  CBC WITH DIFFERENTIAL/PLATELET - Abnormal; Notable for the following components:      Result Value   Eosinophils Absolute 0.7 (*)    All other components within normal limits  COMPREHENSIVE METABOLIC PANEL - Abnormal; Notable for the following components:   Calcium 8.6 (*)    Albumin 3.4 (*)    All other components within normal limits  TROPONIN I    EKG None  Radiology Dg Chest 1 View  Result Date: 02/24/2018 CLINICAL DATA:  Weakness and syncopal episode today while eating lunch. History of COPD, Parkinson's disease, dementia. EXAM: CHEST  1 VIEW COMPARISON:  Chest x-ray of June 29, 2017 FINDINGS: The lungs are well-expanded. There is no focal infiltrate. There is no pleural effusion. The interstitial markings are coarse. The heart and pulmonary vascularity are normal. The mediastinum is normal in width. There is calcification adjacent to the lower aspect of the trachea on the left which is chronic. There is calcification in the wall of the aortic arch. There is tortuosity of the descending thoracic aorta. There is old deformity of the right humeral head and neck. IMPRESSION: There is no acute cardiopulmonary abnormality. Mild chronic bronchitic changes. Thoracic aortic  atherosclerosis. Electronically Signed   By: David  [place] M.D.   On: 02/12/2018 14:24   Ct Head Wo Contrast  Result Date: 03/06/2018 CLINICAL DATA:  Altered level of consciousness. EXAM: CT HEAD WITHOUT CONTRAST TECHNIQUE: Contiguous axial images were obtained from the base of the skull through the vertex without intravenous contrast. COMPARISON:  CT head 04/22/2017 FINDINGS: Brain: Moderate atrophy. Negative for hydrocephalus. Mild chronic microvascular ischemic change in the white matter. Negative for acute infarct. Negative for hemorrhage or mass. Vascular: Negative for hyperdense vessel Skull: Negative Sinuses/Orbits: Paranasal sinuses clear. Bilateral cataract surgery. Other: None IMPRESSION: No acute abnormality and no change from the prior CT. Electronically Signed   By: Marlan Palau M.D.   On: 02/21/2018 14:35    Procedures Procedures (including critical care time)  Medications Ordered in ED Medications - No data to display   Initial Impression / Assessment and Plan / ED Course  I have reviewed the triage vital signs and the nursing notes.  Pertinent labs & imaging results that were available during my care of the patient were reviewed by me and considered in my medical decision making (see chart for details).     Patient with syncopal episode and significant dementia.  Labs unremarkable CT scan shows no acute changes patient will be discharged back to the nursing home for follow-up care with her doctor  Final Clinical Impressions(s) / ED Diagnoses   Final diagnoses:  Syncope, unspecified syncope type    ED Discharge Orders    None       Bethann Berkshire, MD 03/01/2018 1718

## 2018-02-25 NOTE — Discharge Instructions (Addendum)
Follow-up with your family doctor if any problems 

## 2018-02-28 ENCOUNTER — Encounter: Payer: Self-pay | Admitting: Internal Medicine

## 2018-02-28 ENCOUNTER — Non-Acute Institutional Stay (SKILLED_NURSING_FACILITY): Payer: Medicare Other | Admitting: Internal Medicine

## 2018-02-28 DIAGNOSIS — G20 Parkinson's disease: Secondary | ICD-10-CM

## 2018-02-28 DIAGNOSIS — R55 Syncope and collapse: Secondary | ICD-10-CM | POA: Diagnosis not present

## 2018-02-28 NOTE — Progress Notes (Signed)
Location:    [facility] Room Number: 152/D Place of Service:  SNF [phone]) Provider: Einar Crow MD  Estevan Oaks, NP  Patient Care Team: Estevan Oaks, NP as PCP - General (Nurse Practitioner)  Extended Emergency Contact Information Primary Emergency Contact: Soley,[name] Address: [address]          [place], [place] [address] [place] Home Phone: [phone] Relation: Spouse Secondary Emergency Contact: Chuang,[name] Address: [address]          [place], [place] [address] [place] Home Phone: [phone] Mobile Phone: [phone] Relation: Son  Code Status:  DNR Goals of care: Advanced Directive information Advanced Directives 02/28/2018  Does Patient Have a Medical Advance Directive? Yes  Type of Advance Directive Out of facility DNR (pink MOST or yellow form)  Does patient want to make changes to medical advance directive? No - Patient declined  Copy of Healthcare Power of Attorney in Chart? No - copy requested  Would patient like information on creating a medical advance directive? -  Pre-existing out of facility DNR order (yellow form or pink MOST form) -     Chief Complaint  Patient presents with  . Acute Visit    F/U ED Visit    HPI:  Pt is a 84 y.o. female seen today for an acute visit for Episode of Syncope patient had in the Lunch room on Friday.  Patient has a history of hypertension, Parkinson disease with hallucinations, Recurrent falls, COPD,Humerus fracture 09/18, Low back pain due to compression Fracture, Osteoporosis, Primary open angle Glaucoma. Bilateral Closed Pubic Rami fracture, h/o Fecal Impaction, Dementiaand Anxiety Patient was in restorative when she did not respond to her Aid for few Min and then was confused more then usual . Her Husband was called and he wanted her to go to ED for Eval. In ED her CT scan of head was Negative for any acute process.All her Labs were  negative for any acute Changes. Patient is back to her baseline. She says she does remember going to the hospital. Did not have any new Complains today. Past Medical History:  Diagnosis Date  . Arthritis    osteoporosis  . Back pain   . Chronic obstructive pulmonary disease (COPD) (HCC)   . COPD (chronic obstructive pulmonary disease) (HCC)   . Dementia (HCC)   . Gait instability   . Glaucoma   . Glaucoma   . Hypertension   . Neck pain   . Parkinson's disease (HCC)   . Tremor    Past Surgical History:  Procedure Laterality Date  . CATARACT EXTRACTION    . EYE SURGERY    . TONSILLECTOMY    . TUBAL LIGATION      Allergies  Allergen Reactions  . Hydrochlorothiazide Other (See Comments)    Reaction:  Dizziness   . Lisinopril Cough  . Other Other (See Comments)    Pt states that she is allergic to multiple BP meds -- does not recall the names.   Reaction:  Dizziness   . Spironolactone Other (See Comments)    Reaction:  Blurred vision and sweating of feet     Outpatient Encounter Medications as of 02/28/2018  Medication Sig  . acetaminophen (TYLENOL) 325 MG tablet Take 650 mg by mouth every 6 (six) hours as needed.  Marland Kitchen aspirin EC 81 MG tablet Take 81 mg by mouth daily.  Marland Kitchen atenolol (TENORMIN) 25 MG tablet Take 25 mg by mouth daily.   Marland Kitchen  bisacodyl (DULCOLAX) 10 MG suppository Place 1 suppository (10 mg total) rectally as needed for moderate constipation.  . busPIRone (BUSPAR) 5 MG tablet Take 2.5 mg by mouth 2 (two) times daily.   . Carboxymethylcellulose Sod PF 0.5 % SOLN Place 1 drop into both eyes every 6 (six) hours as needed (for dry eyes).   Marland Kitchen. docusate sodium (COLACE) 100 MG capsule Take 1 capsule (100 mg total) by mouth 2 (two) times daily.  Marland Kitchen. linaclotide (LINZESS) 145 MCG CAPS capsule Take 1 capsule (145 mcg total) by mouth daily before breakfast.  . Melatonin 3 MG TABS Take 3 mg by mouth at bedtime.  . Menthol (ICY HOT ADVANCED RELIEF) 7.5 % PTCH Apply 1 patch  topically every 12 (twelve) hours.  . Multiple Vitamin (MULTIVITAMIN) tablet Take 1 tablet by mouth daily.  . nitroGLYCERIN (NITROSTAT) 0.4 MG SL tablet Place 1 tablet under the tongue as directed. Place 1 tablet under the tongue every five minutes as needed for emergency chest pain  . pantoprazole (PROTONIX) 40 MG tablet Take 40 mg by mouth daily.  . polyethylene glycol (MIRALAX / GLYCOLAX) packet Take 17 g by mouth daily as needed.  . Polyvinyl Alcohol-Povidone PF 1.4-0.6 % SOLN Apply 1 drop to eye 2 (two) times daily as needed (for dry eyes).   . rotigotine (NEUPRO) 4 MG/24HR Place 1 patch onto the skin daily.  . timolol (BETIMOL) 0.5 % ophthalmic solution Place 1 drop into both eyes 2 (two) times daily.  Marland Kitchen. tiotropium (SPIRIVA) 18 MCG inhalation capsule Place 18 mcg into inhaler and inhale daily.  . [DISCONTINUED] alendronate (FOSAMAX) 70 MG tablet Take 70 mg by mouth every Monday. Take with a full glass of water on an empty stomach.  . [DISCONTINUED] rotigotine (NEUPRO) 4 MG/24HR Place 1 patch onto the skin daily.   No facility-administered encounter medications on file as of 02/28/2018.      Review of Systems  Constitutional: Negative.   HENT: Negative.   Respiratory: Negative.   Cardiovascular: Negative.   Genitourinary: Negative.   Musculoskeletal: Positive for arthralgias and myalgias.  Neurological: Negative.   Psychiatric/Behavioral: Negative.  Negative for agitation.     There is no immunization history on file for this patient. Pertinent  Health Maintenance Due  Topic Date Due  . PNA vac Low Risk Adult (2 of 2 - PPSV23) 01/20/2019  . INFLUENZA VACCINE  Completed  . DEXA SCAN  Completed   Fall Risk  08/05/2017 07/18/2015 03/19/2015 02/12/2015  Falls in the past year? Yes Yes No Yes  Number falls in past yr: 2 or more 1 - 2 or more  Injury with Fall? Yes No - Yes  Comment hip fracture - - left lower leg   Risk for fall due to : - (No Data) - -  Risk for fall due to:  Comment - accidently hit by door that pushed her down - -   Functional Status Survey:    Vitals:   02/28/18 1059  BP: (!) 98/54  Pulse: 84  Resp: 20  Temp: (!) 97 F (36.1 C)  TempSrc: Oral  SpO2: 95%   There is no height or weight on file to calculate BMI. Physical Exam  Constitutional: She appears well-developed.  HENT:  Head: Normocephalic.  Mouth/Throat: Oropharynx is clear and moist.  Eyes: Pupils are equal, round, and reactive to light.  Neck: Neck supple.  Cardiovascular: Normal rate and regular rhythm.  Pulmonary/Chest: Effort normal and breath sounds normal. No stridor. No respiratory distress.  She has no wheezes.  Abdominal: Soft. Bowel sounds are normal. She exhibits no distension. There is no tenderness. There is no guarding.  Musculoskeletal: She exhibits no edema.  Neurological: She is alert.  Her Mental status was at her Baseline. Follows Commands and has no Focal deficits She walked with the walker and Mild assist today. She answers Appropriately. Follows simple commands  Skin: Skin is warm and dry.  Psychiatric: She has a normal mood and affect. Her behavior is normal.    Labs reviewed: Recent Labs    06/29/17 0742  11/16/17 0540 12/28/17 0800 02/14/2018 1401  NA 134*   < > 139 138 138  K 3.8   < > 4.1 4.0 4.1  CL 97*   < > 106 105 109  CO2 24   < > 28 28 24   GLUCOSE 144*   < > 93 94 89  BUN 18   < > 22 21 21   CREATININE 0.58   < > 0.65 0.66 0.78  CALCIUM 8.8*   < > 9.1 8.7* 8.6*  MG 1.9  --   --   --   --    < > = values in this interval not displayed.   Recent Labs    08/20/17 1807 08/21/17 0553 02/24/2018 1401  AST 27 21 23   ALT 16 13* 15  ALKPHOS 89 67 56  BILITOT 1.0 0.8 1.2  PROT 8.4* 6.6 7.4  ALBUMIN 3.5 2.7* 3.4*   Recent Labs    08/20/17 1807  11/16/17 0540 12/28/17 0847 02/12/2018 1401  WBC 10.4   < > 8.9 8.8 7.8  NEUTROABS 6.4  --   --  4.5 4.1  HGB 13.4   < > 13.3 13.0 12.9  HCT 41.9   < > 40.2 39.8 41.0  MCV 90.1   < >  96.6 96.4 95.8  PLT 325   < > 260 255 247   < > = values in this interval not displayed.   Lab Results  Component Value Date   TSH 1.419 06/16/2017   No results found for: HGBA1C No results found for: CHOL, HDL, LDLCALC, LDLDIRECT, TRIG, CHOLHDL  Significant Diagnostic Results in last 30 days:  Dg Chest 1 View  Result Date: 02/20/2018 CLINICAL DATA:  Weakness and syncopal episode today while eating lunch. History of COPD, Parkinson's disease, dementia. EXAM: CHEST  1 VIEW COMPARISON:  Chest x-ray of June 29, 2017 FINDINGS: The lungs are well-expanded. There is no focal infiltrate. There is no pleural effusion. The interstitial markings are coarse. The heart and pulmonary vascularity are normal. The mediastinum is normal in width. There is calcification adjacent to the lower aspect of the trachea on the left which is chronic. There is calcification in the wall of the aortic arch. There is tortuosity of the descending thoracic aorta. There is old deformity of the right humeral head and neck. IMPRESSION: There is no acute cardiopulmonary abnormality. Mild chronic bronchitic changes. Thoracic aortic atherosclerosis. Electronically Signed   By: David  [place] M.D.   On: 02/12/2018 14:24   Ct Head Wo Contrast  Result Date: 02/11/2018 CLINICAL DATA:  Altered level of consciousness. EXAM: CT HEAD WITHOUT CONTRAST TECHNIQUE: Contiguous axial images were obtained from the base of the skull through the vertex without intravenous contrast. COMPARISON:  CT head 04/22/2017 FINDINGS: Brain: Moderate atrophy. Negative for hydrocephalus. Mild chronic microvascular ischemic change in the white matter. Negative for acute infarct. Negative for hemorrhage or mass. Vascular: Negative for hyperdense vessel  Skull: Negative Sinuses/Orbits: Paranasal sinuses clear. Bilateral cataract surgery. Other: None IMPRESSION: No acute abnormality and no change from the prior CT. Electronically Signed   By: Marlan Palau M.D.   On:  03-07-2018 14:35    Assessment/Plan  Episode of Syncope Negative work up in the hospital Continue to monitor for now Her BP has been low Will hold her Tenormin for few days and revaluate Dysphagia Fosamax discontinued Was evaluated by Speech with no new Recommendations   Family/ staff Communication:   Labs/tests ordered:   Total time spent in this patient care encounter was 25_ minutes; greater than 50% of the visit spent counseling patient, reviewing records , Labs and coordinating care for problems addressed at this encounter.

## 2018-03-13 DEATH — deceased

## 2018-03-29 ENCOUNTER — Non-Acute Institutional Stay (SKILLED_NURSING_FACILITY): Payer: Medicare Other | Admitting: Internal Medicine

## 2018-03-29 ENCOUNTER — Encounter: Payer: Self-pay | Admitting: Internal Medicine

## 2018-03-29 DIAGNOSIS — F0281 Dementia in other diseases classified elsewhere with behavioral disturbance: Secondary | ICD-10-CM

## 2018-03-29 DIAGNOSIS — K5900 Constipation, unspecified: Secondary | ICD-10-CM | POA: Diagnosis not present

## 2018-03-29 DIAGNOSIS — I10 Essential (primary) hypertension: Secondary | ICD-10-CM

## 2018-03-29 DIAGNOSIS — F419 Anxiety disorder, unspecified: Secondary | ICD-10-CM | POA: Diagnosis not present

## 2018-03-29 DIAGNOSIS — G20 Parkinson's disease: Secondary | ICD-10-CM

## 2018-03-29 DIAGNOSIS — F02818 Dementia in other diseases classified elsewhere, unspecified severity, with other behavioral disturbance: Secondary | ICD-10-CM

## 2018-03-29 NOTE — Progress Notes (Signed)
Location:    [facility] Room Number: 105/D Place of Service:  SNF (31) Provider:  Edmon Crape PA-C  Estevan Oaks, NP  Patient Care Team: Estevan Oaks, NP as PCP - General (Nurse Practitioner)  Extended Emergency Contact Information Primary Emergency Contact: Pfohl,[name] Address: [address]          [place], [place] [address] [place] Home Phone: [phone] Relation: Spouse Secondary Emergency Contact: Myricks,[name] Address: [address]          [place], [place] [address] [place] Home Phone: [phone] Mobile Phone: [phone] Relation: Son  Code Status:  DNR Goals of care: Advanced Directive information Advanced Directives 03/29/2018  Does Patient Have a Medical Advance Directive? Yes  Type of Advance Directive Out of facility DNR (pink MOST or yellow form)  Does patient want to make changes to medical advance directive? No - Patient declined  Copy of Healthcare Power of Attorney in Chart? No - copy requested  Would patient like information on creating a medical advance directive? -  Pre-existing out of facility DNR order (yellow form or pink MOST form) -     Chief Complaint  Patient presents with  . Medical Management of Chronic Issues    Routine visit of medical management  --Chronic medical conditions including Parkinson's disease with dementia and hallucinations- hypertension- anxiety- COPD-history of constipation with impaction-osteoporosis-  HPI:  Pt is a 84 y.o. female seen today for medical management of chronic diseases.  As noted above.  She appears to be doing well in facility her weight is relatively stable in the mid 90s continues to have somewhat of a spotty appetite--- does better with encouragement she does have a very supportive husband who comes in largely without exception in the late afternoon.  She continues to sit in her wheelchair mainly but at times will walk with  assistance and a walker when she is working with restorative therapy.  In regards to her medical issues she does have dementia with Parkinson's disease but does well with supportive care she is on BuSpar for anxiety this is low dose and appears tolerated well.  She also has a history of hypertension which appears well controlled despite not being on any medications appears her systolics largely in the 130s.  She does have complaints of back pain but does have IcyHot topical for this and this appears to help.  She also has an order for Tylenol as needed.  She has a previous history of fecal impaction but this appears to be stabilized on Linzess since Wednesday with docusate she is having regular bowel movements.  She also has a history of Parkinson's she is on neuro pro patch-continues to have upper extremity tremors but these appear to be relatively stabilized.  She also at one point had syncope she actually went to the ER work-up was negative labs were within normal range and there is been been really any recurrence.  She also apparently had an episode of rectal bleeding here to go there is been no further incidents to my knowledge her hemoglobin has been stable and her husband did not really want any further work-up.  Currently she is sitting in her wheelchair comfortably she does not really have any complaints except occasional back pain nursing does not report any recent issues    Past Medical History:  Diagnosis Date  . Arthritis    osteoporosis  . Back pain   . Chronic obstructive pulmonary disease (  COPD) (HCC)   . COPD (chronic obstructive pulmonary disease) (HCC)   . Dementia (HCC)   . Gait instability   . Glaucoma   . Glaucoma   . Hypertension   . Neck pain   . Parkinson's disease (HCC)   . Tremor    Past Surgical History:  Procedure Laterality Date  . CATARACT EXTRACTION    . EYE SURGERY    . TONSILLECTOMY    . TUBAL LIGATION      Allergies  Allergen Reactions   . Hydrochlorothiazide Other (See Comments)    Reaction:  Dizziness   . Lisinopril Cough  . Other Other (See Comments)    Pt states that she is allergic to multiple BP meds -- does not recall the names.   Reaction:  Dizziness   . Spironolactone Other (See Comments)    Reaction:  Blurred vision and sweating of feet     Outpatient Encounter Medications as of 03/29/2018  Medication Sig  . acetaminophen (TYLENOL) 325 MG tablet Take 650 mg by mouth every 6 (six) hours as needed.  Marland Kitchen. aspirin EC 81 MG tablet Take 81 mg by mouth daily.  . bisacodyl (DULCOLAX) 10 MG suppository Place 1 suppository (10 mg total) rectally as needed for moderate constipation.  . busPIRone (BUSPAR) 5 MG tablet Take 2.5 mg by mouth 2 (two) times daily.   Marland Kitchen. docusate sodium (COLACE) 100 MG capsule Take 1 capsule (100 mg total) by mouth 2 (two) times daily.  Marland Kitchen. linaclotide (LINZESS) 145 MCG CAPS capsule Take 1 capsule (145 mcg total) by mouth daily before breakfast.  . Melatonin 3 MG TABS Take 3 mg by mouth at bedtime.  . Menthol (ICY HOT ADVANCED RELIEF) 7.5 % PTCH Apply 1 patch topically every 12 (twelve) hours.  . Multiple Vitamin (MULTIVITAMIN) tablet Take 1 tablet by mouth daily.  . nitroGLYCERIN (NITROSTAT) 0.4 MG SL tablet Place 1 tablet under the tongue as directed. Place 1 tablet under the tongue every five minutes as needed for emergency chest pain  . pantoprazole (PROTONIX) 40 MG tablet Take 40 mg by mouth daily.  . polyethylene glycol (MIRALAX / GLYCOLAX) packet Take 17 g by mouth daily as needed.  . Polyvinyl Alcohol-Povidone PF 1.4-0.6 % SOLN Apply 1 drop to eye 2 (two) times daily as needed (for dry eyes).   . rotigotine (NEUPRO) 4 MG/24HR Place 1 patch onto the skin daily.  . timolol (BETIMOL) 0.5 % ophthalmic solution Place 1 drop into both eyes 2 (two) times daily.  . [DISCONTINUED] atenolol (TENORMIN) 25 MG tablet Take 25 mg by mouth daily.   . [DISCONTINUED] Carboxymethylcellulose Sod PF 0.5 % SOLN  Place 1 drop into both eyes every 6 (six) hours as needed (for dry eyes).   . [DISCONTINUED] tiotropium (SPIRIVA) 18 MCG inhalation capsule Place 18 mcg into inhaler and inhale daily.   No facility-administered encounter medications on file as of 03/29/2018.      Review of Systems   Limited secondary to dementia please see HPI  There is no immunization history on file for this patient. Pertinent  Health Maintenance Due  Topic Date Due  . PNA vac Low Risk Adult (2 of 2 - PPSV23) 01/20/2019  . INFLUENZA VACCINE  Completed  . DEXA SCAN  Completed   Fall Risk  08/05/2017 07/18/2015 03/19/2015 02/12/2015  Falls in the past year? Yes Yes No Yes  Number falls in past yr: 2 or more 1 - 2 or more  Injury with Fall? Yes No -  Yes  Comment hip fracture - - left lower leg   Risk for fall due to : - (No Data) - -  Risk for fall due to: Comment - accidently hit by door that pushed her down - -   Functional Status Survey:    Vitals:   03/29/18 1406  BP: 130/75  Pulse: 77  Resp: 18  Temp: 98.6 F (37 C)  TempSrc: Oral  SpO2: 97%  Weight: 94 lb 6.4 oz (42.8 kg)  Height: 4\' 8"  (1.422 m)   Body mass index is 21.16 kg/m. Physical Exam   In general this is a pleasant elderly female in no distress sitting comfortably in a wheelchair.  Her skin is warm and dry.  Eyes visual acuity appears to be intact sclera and conjunctive are clear she has prescription lenses.  Oropharynx is clear mucous membranes moist.  Chest is clear to auscultation with shallow air entry there is no labored breathing she is kyphotic.  Heart is regular rate and rhythm without murmur gallop or rub she does not have any labored breathing.  Her abdomen soft nontender with positive bowel sounds.  Musculoskeletal has general frailty but peers to move her extremities at baseline at times she will walk with assistance with restorative this is quite a short distance however.  Neurologic she does have baseline upper  extremity bilaterally is tremors tremors- she is alert neurologic status appears to be at baseline she does follow commands cannot really appreciate lateralizing findings.  Psych she is oriented to self she is pleasant and answers simple questions appropriately and follows simple commands.    Labs reviewed: Recent Labs    06/29/17 0742  11/16/17 0540 12/28/17 0800 02/27/2018 1401  NA 134*   < > 139 138 138  K 3.8   < > 4.1 4.0 4.1  CL 97*   < > 106 105 109  CO2 24   < > 28 28 24   GLUCOSE 144*   < > 93 94 89  BUN 18   < > 22 21 21   CREATININE 0.58   < > 0.65 0.66 0.78  CALCIUM 8.8*   < > 9.1 8.7* 8.6*  MG 1.9  --   --   --   --    < > = values in this interval not displayed.   Recent Labs    08/20/17 1807 08/21/17 0553 02/19/2018 1401  AST 27 21 23   ALT 16 13* 15  ALKPHOS 89 67 56  BILITOT 1.0 0.8 1.2  PROT 8.4* 6.6 7.4  ALBUMIN 3.5 2.7* 3.4*   Recent Labs    08/20/17 1807  11/16/17 0540 12/28/17 0847 03/07/2018 1401  WBC 10.4   < > 8.9 8.8 7.8  NEUTROABS 6.4  --   --  4.5 4.1  HGB 13.4   < > 13.3 13.0 12.9  HCT 41.9   < > 40.2 39.8 41.0  MCV 90.1   < > 96.6 96.4 95.8  PLT 325   < > 260 255 247   < > = values in this interval not displayed.   Lab Results  Component Value Date   TSH 1.419 06/16/2017   No results found for: HGBA1C No results found for: CHOL, HDL, LDLCALC, LDLDIRECT, TRIG, CHOLHDL  Significant Diagnostic Results in last 30 days:  No results found.  Assessment/Plan  #1 Parkinson's disease with dementia- this appears fairly stabilized with supportive care she does continue on neupro acute patch- her weight is stable nursing does  not really report any behaviors.  2.  Anxiety with dementia she is on BuSpar this appears to be effective and well-tolerated BuSpar is low-dose.  3.-  History of hypertension no longer on any medications but this appears stable at one point had been on Norvasc and Toprol.  4.  Syncope there have been no further episodes  again work-up in the ER was negative.  5.  History of rectal bleeding apparently this was a one-time episode no reoccurrence and her husband did not desire any aggressive follow-up her hemoglobin has shown stability.  She is on a proton pump inhibitor  6.  History of low back pain she does have a IcyHot patch as well as Tylenol if needed.  7.  History of constipation with impaction she has been started on Linzess she is also on docusate sodium and this appears to be stable.  8.-  History of osteoporosis at one point was on Fosamax but this has been discontinued because of dysphasia concerns.  9.  History of insomnia she continues on melatonin.  10.  History of  glaucoma she is on topical eyedrops   CPT- [phone]

## 2018-04-04 ENCOUNTER — Encounter: Payer: Self-pay | Admitting: Internal Medicine

## 2018-04-04 ENCOUNTER — Non-Acute Institutional Stay (SKILLED_NURSING_FACILITY): Payer: Medicare Other | Admitting: Internal Medicine

## 2018-04-04 DIAGNOSIS — F419 Anxiety disorder, unspecified: Secondary | ICD-10-CM | POA: Diagnosis not present

## 2018-04-04 DIAGNOSIS — K5900 Constipation, unspecified: Secondary | ICD-10-CM

## 2018-04-04 DIAGNOSIS — R1084 Generalized abdominal pain: Secondary | ICD-10-CM

## 2018-04-04 DIAGNOSIS — R109 Unspecified abdominal pain: Secondary | ICD-10-CM | POA: Diagnosis not present

## 2018-04-04 DIAGNOSIS — K5641 Fecal impaction: Secondary | ICD-10-CM | POA: Diagnosis not present

## 2018-04-04 NOTE — Progress Notes (Signed)
This is an acute visit.  Level care skilled.  Facility is [facility].  Chief complaint-acute visit secondary to question abdominal pain.  History of present illness.  Patient is an 84 year old female who has somewhat vague complaints of abdominal discomfort this evening.  She is a long-term resident of facility with a history of Parkinson's disease with dementia and hallucinations as well as COPD anxiety hypertension osteoporosis in a previous history of fecal impaction.  She is currently on Colace twice daily as well as Linzess 145 mcg a day and bisacodyl  suppository as needed.  Apparently she complained to her husband earlier this evening with some abdominal pain although I do not believe this is a totally new complaint.  Patient is a poor historian secondary to dementia per nursing staff she appears to be at her baseline today-- when her husband who is very supportive leaves in the evening she gets somewhat more anxious which appears to be the case presently\  Past Medical History:  Diagnosis Date  . Arthritis    osteoporosis  . Back pain   . Chronic obstructive pulmonary disease (COPD) (HCC)   . COPD (chronic obstructive pulmonary disease) (HCC)   . Dementia (HCC)   . Gait instability   . Glaucoma   . Glaucoma   . Hypertension   . Neck pain   . Parkinson's disease (HCC)   . Tremor         Past Surgical History:  Procedure Laterality Date  . CATARACT EXTRACTION    . EYE SURGERY    . TONSILLECTOMY    . TUBAL LIGATION           Allergies  Allergen Reactions  . Hydrochlorothiazide Other (See Comments)    Reaction:  Dizziness   . Lisinopril Cough  . Other Other (See Comments)    Pt states that she is allergic to multiple BP meds -- does not recall the names.   Reaction:  Dizziness   . Spironolactone Other (See Comments)    Reaction:  Blurred vision and sweating of feet       MEDICATIONS      Sig  . acetaminophen  (TYLENOL) 325 MG tablet Take 650 mg by mouth every 6 (six) hours as needed.  Marland Kitchen. aspirin EC 81 MG tablet Take 81 mg by mouth daily.  . bisacodyl (DULCOLAX) 10 MG suppository Place 1 suppository (10 mg total) rectally as needed for moderate constipation.  . busPIRone (BUSPAR) 5 MG tablet Take 2.5 mg by mouth 2 (two) times daily.   Marland Kitchen. docusate sodium (COLACE) 100 MG capsule Take 1 capsule (100 mg total) by mouth 2 (two) times daily.  Marland Kitchen. linaclotide (LINZESS) 145 MCG CAPS capsule Take 1 capsule (145 mcg total) by mouth daily before breakfast.  . Melatonin 3 MG TABS Take 3 mg by mouth at bedtime.  . Menthol (ICY HOT ADVANCED RELIEF) 7.5 % PTCH Apply 1 patch topically every 12 (twelve) hours.  . Multiple Vitamin (MULTIVITAMIN) tablet Take 1 tablet by mouth daily.  . nitroGLYCERIN (NITROSTAT) 0.4 MG SL tablet Place 1 tablet under the tongue as directed. Place 1 tablet under the tongue every five minutes as needed for emergency chest pain  . pantoprazole (PROTONIX) 40 MG tablet Take 40 mg by mouth daily.  . polyethylene glycol (MIRALAX / GLYCOLAX) packet Take 17 g by mouth daily as needed.  . Polyvinyl Alcohol-Povidone PF 1.4-0.6 % SOLN Apply 1 drop to eye 2 (two) times daily as needed (for dry eyes).   .Marland Kitchen  rotigotine (NEUPRO) 4 MG/24HR Place 1 patch onto the skin daily.  . timolol (BETIMOL) 0.5 % ophthalmic solution Place 1 drop into both eyes 2 (two) times daily.  . [DISCONTINUED] atenolol (TENORMIN) 25 MG tablet Take 25 mg by mouth daily.   . [DISCONTINUED] Carboxymethylcellulose Sod PF 0.5 % SOLN Place 1 drop into both eyes every 6 (six) hours as needed (for dry eyes).   . [DISCONTINUED] tiotropium (SPIRIVA) 18 MCG inhalation capsule Place 18 mcg into inhaler and inhale daily.   No facility-administered encounter medications on file as of 03/29/2018.    Review of systems.  This is limited secondary to dementia when asked if she is having pain she tends to point occasionally to her abdomen- she does  appear somewhat anxious which per nursing staff is not really new after her husband leaves    Physical exam  She is afebrile 80 operations of 19 blood pressure taken manually 144/70 a little bit above her baseline.  In general this is a somewhat frail elderly female in no distress but does appear to be a bit anxious.  Her skin is warm and dry she is not diaphoretic.  Eyes visual acuity appears to be grossly intact sclera and conjunctive are clear.  Oropharynx is clear mucous membranes moist.  Chest has somewhat poor respiratory effort but clear to auscultation cannot really appreciate any congestion there is no labored breathing.  Heart is regular rate and rhythm without murmur gallop or rub.  She does not have significant lower extremity edema.  Abdomen is soft with positive bowel sounds- appears to possibly have some tenderness but this does not appear to be acute with palpitation- she tends to point to her stomach but when palpated does not appear to show a lot of grimacing-.  Musculoskeletal has general frailty but does move all her extremities it appears at baseline with diffuse arthritic changes.  Neurologic is grossly intact baseline upper extremity tremors.  She is alert.  Psych again she appears to be somewhat anxious she does follow simple verbal commands.  Labs.  February 25, 2018.  WBC 7.8 hemoglobin 12.9 platelets 247.  Sodium 138 potassium 4.1 BUN 21 creatinine 0.78.  Assessment and plan.  1.-Abdominal pain?-  She does have a history of fecal impaction- will write an order to obtain an x-ray of the abdomen to rule out recurrent fecal impaction.  She does continue on Colace twice daily as well as Linzess 145 mcg a day in addition to the clock suppository as needed.  Also will update labs to make sure liver function tests are within normal limits.  Addendum.  Nursing did take patient to the bathroom and said she did have a successful bowel movement- her  tech tells me that after her husband leaves she does get somewhat anxious and does present this way at times.  Again will await x-ray results and labs- monitor vital signs every shift for 24 hours to keep an eye on her status.  #2 anxiety-she is on low-dose BuSpar twice daily again this anxiety appears to be more situational and episodic will monitor.  Fact nursing staff told me later that she was resting in bed comfortably and had actually fallen asleep  [phone]

## 2018-04-05 ENCOUNTER — Encounter (HOSPITAL_COMMUNITY)
Admission: RE | Admit: 2018-04-05 | Discharge: 2018-04-05 | Disposition: A | Discharge status: Home / Self Care | Payer: Medicare Other | Source: Skilled Nursing Facility | Attending: Internal Medicine | Admitting: Internal Medicine

## 2018-04-05 DIAGNOSIS — I10 Essential (primary) hypertension: Secondary | ICD-10-CM | POA: Diagnosis not present

## 2018-04-05 LAB — CBC WITH DIFFERENTIAL/PLATELET
Abs Immature Granulocytes: 0.02 10*3/uL (ref 0.00–0.07)
BASOS PCT: 1 %
Basophils Absolute: 0.1 10*3/uL (ref 0.0–0.1)
EOS ABS: 0.6 10*3/uL — AB (ref 0.0–0.5)
Eosinophils Relative: 7 %
HCT: 46.9 % — ABNORMAL HIGH (ref 36.0–46.0)
Hemoglobin: 14.7 g/dL (ref 12.0–15.0)
Immature Granulocytes: 0 %
Lymphocytes Relative: 20 %
Lymphs Abs: 1.8 10*3/uL (ref 0.7–4.0)
MCH: 30.2 pg (ref 26.0–34.0)
MCHC: 31.3 g/dL (ref 30.0–36.0)
MCV: 96.3 fL (ref 80.0–100.0)
MONO ABS: 1.1 10*3/uL — AB (ref 0.1–1.0)
Monocytes Relative: 12 %
Neutro Abs: 5.5 10*3/uL (ref 1.7–7.7)
Neutrophils Relative %: 60 %
Platelets: 272 10*3/uL (ref 150–400)
RBC: 4.87 MIL/uL (ref 3.87–5.11)
RDW: 14.2 % (ref 11.5–15.5)
WBC: 9.1 10*3/uL (ref 4.0–10.5)
nRBC: 0 % (ref 0.0–0.2)

## 2018-04-05 LAB — COMPREHENSIVE METABOLIC PANEL
ALT: 17 U/L (ref 0–44)
ANION GAP: 8 (ref 5–15)
AST: 26 U/L (ref 15–41)
Albumin: 3.9 g/dL (ref 3.5–5.0)
Alkaline Phosphatase: 65 U/L (ref 38–126)
BUN: 24 mg/dL — ABNORMAL HIGH (ref 8–23)
CO2: 27 mmol/L (ref 22–32)
Calcium: 9.5 mg/dL (ref 8.9–10.3)
Chloride: 104 mmol/L (ref 98–111)
Creatinine, Ser: 0.68 mg/dL (ref 0.44–1.00)
Glucose, Bld: 98 mg/dL (ref 70–99)
POTASSIUM: 4.7 mmol/L (ref 3.5–5.1)
Sodium: 139 mmol/L (ref 135–145)
Total Bilirubin: 1.1 mg/dL (ref 0.3–1.2)
Total Protein: 8.4 g/dL — ABNORMAL HIGH (ref 6.5–8.1)

## 2018-04-29 ENCOUNTER — Non-Acute Institutional Stay (SKILLED_NURSING_FACILITY): Payer: Medicare Other | Admitting: Internal Medicine

## 2018-04-29 ENCOUNTER — Encounter: Payer: Self-pay | Admitting: Internal Medicine

## 2018-04-29 DIAGNOSIS — J00 Acute nasopharyngitis [common cold]: Secondary | ICD-10-CM | POA: Diagnosis not present

## 2018-04-29 DIAGNOSIS — G20 Parkinson's disease: Secondary | ICD-10-CM

## 2018-04-29 DIAGNOSIS — I10 Essential (primary) hypertension: Secondary | ICD-10-CM | POA: Diagnosis not present

## 2018-04-29 NOTE — Progress Notes (Signed)
[facility] Room Number: 105/D Place of Service:  SNF [phone] Provider:  [facility]  Brown, [name], NP  Patient Care Team: [name], [name], NP as PCP - General (Nurse Practitioner)  Extended Emergency Contact Information Primary Emergency Contact: Deardorff,[name] Address: [address]          [place], [place] [address] [place] Home Phone: [phone] Relation: Spouse Secondary Emergency Contact: Scaife,[name] Address: [address]          [place], [place] [address] [place] Home Phone: [phone] Mobile Phone: [phone] Relation: Son  Code Status:  DNR Goals of care: Advanced Directive information Advanced Directives 04/29/2018  Does Patient Have a Medical Advance Directive? Yes  Type of Advance Directive Out of facility DNR (pink MOST or yellow form)  Does patient want to make changes to medical advance directive? No - Patient declined  Copy of Healthcare Power of Attorney in Chart? No - copy requested  Would patient like information on creating a medical advance directive? -  Pre-existing out of facility DNR order (yellow form or pink MOST form) -     Chief Complaint  Patient presents with  . Acute Visit    Cold Symptoms    HPI:  Pt is a 85 y.o. female seen today for an acute visit for apparent cold symptoms.  Patient has been is concerned saying his wife appears to have a stuffy runny nose and nasal congestion.  Per husband there is no sign of coughing or shortness of breath this is more nose mouth head issue.  Patient is a poor historian but per nursing staff no other acute concerns.  Vital signs appear to be stable her temp is 99.0   O2 saturation it is in the 90s- other than what appears to have some clear nasal drainage and stuffiness appears to be at baseline   Patient is a long-term resident of facility with a history of Parkinson's disease as well as dementia hypertension anxiety  COPD history constipation as well as osteoporosis    Past Medical History:  Diagnosis Date  . Arthritis    osteoporosis  . Back pain   . Chronic obstructive pulmonary disease (COPD) (HCC)   . COPD (chronic obstructive pulmonary disease) (HCC)   . Dementia (HCC)   . Gait instability   . Glaucoma   . Glaucoma   . Hypertension   . Neck pain   . Parkinson's disease (HCC)   . Tremor    Past Surgical History:  Procedure Laterality Date  . CATARACT EXTRACTION    . EYE SURGERY    . TONSILLECTOMY    . TUBAL LIGATION      Allergies  Allergen Reactions  . Hydrochlorothiazide Other (See Comments)    Reaction:  Dizziness   . Lisinopril Cough  . Other Other (See Comments)    Pt states that she is allergic to multiple BP meds -- does not recall the names.   Reaction:  Dizziness   . Spironolactone Other (See Comments)    Reaction:  Blurred vision and sweating of feet     Outpatient Encounter Medications as of 04/29/2018  Medication Sig  . acetaminophen (TYLENOL) 325 MG tablet Take 650 mg by mouth every 6 (six) hours as needed.  Marland Kitchen. aspirin EC 81 MG tablet Take 81 mg by mouth daily.  . bisacodyl (DULCOLAX) 10 MG suppository Place 1 suppository (10 mg total) rectally as needed for moderate constipation.  .Marland Kitchen  busPIRone (BUSPAR) 5 MG tablet Take 2.5 mg by mouth 2 (two) times daily.   . Carboxymethylcell-Glycerin PF (REFRESH OPTIVE PF) 0.5-0.9 % SOLN Place 1 drop into both eyes every 6 (six) hours as needed.  . docusate sodium (COLACE) 100 MG capsule Take 1 capsule (100 mg total) by mouth 2 (two) times daily.  Marland Kitchen linaclotide (LINZESS) 145 MCG CAPS capsule Take 1 capsule (145 mcg total) by mouth daily before breakfast.  . Melatonin 3 MG TABS Take 3 mg by mouth at bedtime.  . Menthol (ICY HOT ADVANCED RELIEF) 7.5 % PTCH Apply 1 patch topically every 12 (twelve) hours.  . Multiple Vitamin (MULTIVITAMIN) tablet Take 1 tablet by mouth daily.  . nitroGLYCERIN (NITROSTAT) 0.4 MG SL tablet Place 1  tablet under the tongue as directed. Place 1 tablet under the tongue every five minutes as needed for emergency chest pain  . pantoprazole (PROTONIX) 40 MG tablet Take 40 mg by mouth daily.  . polyethylene glycol (MIRALAX / GLYCOLAX) packet Take 17 g by mouth daily as needed.  . rotigotine (NEUPRO) 4 MG/24HR Place 1 patch onto the skin daily.  . timolol (BETIMOL) 0.5 % ophthalmic solution Place 1 drop into both eyes 2 (two) times daily.  . [DISCONTINUED] Polyvinyl Alcohol-Povidone PF 1.4-0.6 % SOLN Apply 1 drop to eye 2 (two) times daily as needed (for dry eyes).    No facility-administered encounter medications on file as of 04/29/2018.     Review of Systems Tneshia please see HPI again no shortness of breath or cough is been noted per nursing staff and her husband who is very attentive-  Husband feels this is more her nose and mouth and head congestion which is bothering her  There is no immunization history on file for this patient. Pertinent  Health Maintenance Due  Topic Date Due  . PNA vac Low Risk Adult (2 of 2 - PPSV23) 01/20/2019  . INFLUENZA VACCINE  Completed  . DEXA SCAN  Completed   Fall Risk  08/05/2017 07/18/2015 03/19/2015 02/12/2015  Falls in the past year? Yes Yes No Yes  Number falls in past yr: 2 or more 1 - 2 or more  Injury with Fall? Yes No - Yes  Comment hip fracture - - left lower leg   Risk for fall due to : - (No Data) - -  Risk for fall due to: Comment - accidently hit by door that pushed her down - -   Functional Status Survey:    Vitals:   04/29/18 1643  BP: (!) 120/40  Pulse: 74  Temp: 99 F (37.2 C)  TempSrc: Oral  SpO2: 98%    Physical Exam General this is a somewhat frail elderly female who appears to be at her baseline sitting in wheelchair she does appear to have some head congestion.  Her skin is warm and dry.  Eyes visual acuity appears to be intact sclera and conjunctive are clear possibly small amount of drainage clear  drainage.  Nose she does have clear drainage.  Oropharynx appears to be clear mucous membranes moist.  Chest has shallow air entry but there is no labored breathing no congestion appears to be a benign exam.  Heart is regular rate and rhythm without murmur gallop or rub she does not have lower extremity edema.  Abdomen is soft nontender with positive bowel sounds.  Musculoskeletal continues with general frailty but moves her extremities at baseline she has tenured diffuse arthritic changes upper and lower extremities.  Neurologic is  grossly intact she is alert speaks fairly minimally.  Psych to be at baseline slightly anxious but that is somewhat baseline does follow simple verbal commands without difficulty Labs reviewed: Recent Labs    06/29/17 0742  12/28/17 0800 2017/09/21 1401 04/05/18 0700  NA 134*   < > 138 138 139  K 3.8   < > 4.0 4.1 4.7  CL 97*   < > 105 109 104  CO2 24   < > 28 24 27   GLUCOSE 144*   < > 94 89 98  BUN 18   < > 21 21 24*  CREATININE 0.58   < > 0.66 0.78 0.68  CALCIUM 8.8*   < > 8.7* 8.6* 9.5  MG 1.9  --   --   --   --    < > = values in this interval not displayed.   Recent Labs    08/21/17 0553 2017/09/21 1401 04/05/18 0700  AST 21 23 26   ALT 13* 15 17  ALKPHOS 67 56 65  BILITOT 0.8 1.2 1.1  PROT 6.6 7.4 8.4*  ALBUMIN 2.7* 3.4* 3.9   Recent Labs    12/28/17 0847 2017/09/21 1401 04/05/18 0700  WBC 8.8 7.8 9.1  NEUTROABS 4.5 4.1 5.5  HGB 13.0 12.9 14.7  HCT 39.8 41.0 46.9*  MCV 96.4 95.8 96.3  PLT 255 247 272   Lab Results  Component Value Date   TSH 1.419 06/16/2017   No results found for: HGBA1C No results found for: CHOL, HDL, LDLCALC, LDLDIRECT, TRIG, CHOLHDL  Significant Diagnostic Results in last 30 days:  No results found.  Assessment/Plan  #1 cold symptoms-this appears to be more confined to her head at this point will treat with Flonase 1 spray twice daily to each nostril for suspected allergic rhinitis.  Monitor  vital signs pulse ox every shift for 48 hours to keep an eye on her-- I did speak to her husband that if he notices any cough or shortness of breath or congestion to let nursing now expediently.  I have also written orders to that effect  At this point husband does not want any aggressive work-up like x-rays   2 Parkinson's disease with dementia this appears stable continues on neupro patch--her weight continues to be stable at around 94 pounds.  3.  History of hypertension her blood pressure medicines have been discontinued but this appears to be stable she had been on Norvasc and Toprol but does not appear to be necessary at this point blood pressure today shows continued stability.  4.-  History of rectal bleeding apparently this was a one-time episode there is been no reoccurrence-her husband does not desire any aggressive follow-up hemoglobin continues to show stability was actually 14.7 on December 24 which is an improvement from the previous level of 12.9 I suspect there will continue to be some variation   [phone]

## 2018-05-02 ENCOUNTER — Non-Acute Institutional Stay (SKILLED_NURSING_FACILITY): Payer: Medicare Other | Admitting: Internal Medicine

## 2018-05-02 ENCOUNTER — Encounter: Payer: Self-pay | Admitting: Internal Medicine

## 2018-05-02 DIAGNOSIS — J00 Acute nasopharyngitis [common cold]: Secondary | ICD-10-CM

## 2018-05-02 DIAGNOSIS — H6123 Impacted cerumen, bilateral: Secondary | ICD-10-CM | POA: Diagnosis not present

## 2018-05-02 NOTE — Progress Notes (Signed)
This is an acute visit.  Level of care skilled.  Facility is [facility].  Chief complaint- acute visit secondary to concerns for cerumen impaction.  History of present illness.  Patient is a 85 year old female who is a long-term resident of the facility with a history of Parkinson's disease in addition to dementia-hypertension anxiety COPD history of constipation as well as osteoporosis.  I did see her last week for cold type symptoms and did order Flonase and from what I can tell tonight this appears to be improved.  However husband is concerned saying she is hard of hearing and thinks she may have wax in her ears.  Past Medical History:  Diagnosis Date  . Arthritis    osteoporosis  . Back pain   . Chronic obstructive pulmonary disease (COPD) (HCC)   . COPD (chronic obstructive pulmonary disease) (HCC)   . Dementia (HCC)   . Gait instability   . Glaucoma   . Glaucoma   . Hypertension   . Neck pain   . Parkinson's disease (HCC)   . Tremor         Past Surgical History:  Procedure Laterality Date  . CATARACT EXTRACTION    . EYE SURGERY    . TONSILLECTOMY    . TUBAL LIGATION           Allergies  Allergen Reactions  . Hydrochlorothiazide Other (See Comments)    Reaction:  Dizziness   . Lisinopril Cough  . Other Other (See Comments)    Pt states that she is allergic to multiple BP meds -- does not recall the names.   Reaction:  Dizziness   . Spironolactone Other (See Comments)    Reaction:  Blurred vision and sweating of feet       MEDICATIONS     Medication Sig  . acetaminophen (TYLENOL) 325 MG tablet Take 650 mg by mouth every 6 (six) hours as needed.  Marland Kitchen. aspirin EC 81 MG tablet Take 81 mg by mouth daily.  . bisacodyl (DULCOLAX) 10 MG suppository Place 1 suppository (10 mg total) rectally as needed for moderate constipation.  . busPIRone (BUSPAR) 5 MG tablet Take 2.5 mg by mouth 2 (two) times daily.   .  Carboxymethylcell-Glycerin PF (REFRESH OPTIVE PF) 0.5-0.9 % SOLN Place 1 drop into both eyes every 6 (six) hours as needed.  . docusate sodium (COLACE) 100 MG capsule Take 1 capsule (100 mg total) by mouth 2 (two) times daily.  Marland Kitchen. linaclotide (LINZESS) 145 MCG CAPS capsule Take 1 capsule (145 mcg total) by mouth daily before breakfast.  . Melatonin 3 MG TABS Take 3 mg by mouth at bedtime.  . Menthol (ICY HOT ADVANCED RELIEF) 7.5 % PTCH Apply 1 patch topically every 12 (twelve) hours.  . Multiple Vitamin (MULTIVITAMIN) tablet Take 1 tablet by mouth daily.  . nitroGLYCERIN (NITROSTAT) 0.4 MG SL tablet Place 1 tablet under the tongue as directed. Place 1 tablet under the tongue every five minutes as needed for emergency chest pain  . pantoprazole (PROTONIX) 40 MG tablet Take 40 mg by mouth daily.  . polyethylene glycol (MIRALAX / GLYCOLAX) packet Take 17 g by mouth daily as needed.  . rotigotine (NEUPRO) 4 MG/24HR Place 1 patch onto the skin daily.  . timolol (BETIMOL) 0.5 % ophthalmic solution Place 1 drop into both eyes 2 (two) times daily.  . [DISCONTINUED] Polyvinyl Alcohol-Povidone PF 1.4-0.6 % SOLN Apply 1 drop to eye 2 (two) times daily as needed (for dry eyes).  No facility-administered encounter medications on file as of 04/29/2018.     Review of Systems  This is limited secondary to dementia patient being a poor historian- apparently husband has noted that she is more hard of hearing.  Nursing has not reported any issues today in regards to any respiratory issues or pain complaints     Pertinent  Health Maintenance Due  Topic Date Due  . PNA vac Low Risk Adult (2 of 2 - PPSV23) 01/20/2019  . INFLUENZA VACCINE  Completed  . DEXA SCAN  Completed   Fall Risk  08/05/2017 07/18/2015 03/19/2015 02/12/2015  Falls in the past year? Yes Yes No Yes  Number falls in past yr: 2 or more 1 - 2 or more  Injury with Fall? Yes No - Yes  Comment hip fracture - - left lower leg   Risk for fall  due to : - (No Data) - -  Risk for fall due to: Comment - accidently hit by door that pushed her down - -   Functional Status Survey:   Physical exam.  She is afebrile pulse is 62 respirations of 18.  In general this is a somewhat frail elderly female in no distress resting comfortably in bed she does get slightly agitated with attempt at ear exam.  Her skin is warm and dry.  Eyes visual acuity appears to be intact I do not really note any drainage.  Nose I do not really appreciate drainage tonight.  Ears she does have a large amount of dried cerumen in both ears I could not really visualize the tympanic membrane in either ear-exam was somewhat limited secondary to patient somewhat moving her head during exam with some slight agitation.  Oropharynx appears to be clear mucous membranes moist.  Chest is clear to auscultation with poor respiratory effort and shallow air entry there is no labored breathing.  Musculoskeletal appears able to move her extremities at baseline she does not have significant lower extremity edema she does have general frailty limited exam since she is in bed  She does have diffuse arthritic changes.  Neurologic she is alert-does appear to have a tremor more so of her right upper arm.  Psych she appears to be at baseline she is somewhat anxious but this is not new follows verbal commands-.  Labs.  April 05, 2018.  WBC 9.1 hemoglobin 14.7 platelets 272.  Sodium 139 potassium 4.7 BUN 24 creatinine 0.68 liver function test within normal limits  Assessment and plan.  1.  Cerumen impaction-we will treat with Debrox 10 drops twice daily for 3 days flushed with warm water on day 4 and monitor.  2.  History of cold symptoms-she has received Flonase from what I can tell this appears to be improved but will have to be watched.  CPT- [phone]

## 2018-05-19 ENCOUNTER — Encounter: Payer: Self-pay | Admitting: Internal Medicine

## 2018-05-19 ENCOUNTER — Non-Acute Institutional Stay (SKILLED_NURSING_FACILITY): Payer: Medicare Other | Admitting: Internal Medicine

## 2018-05-19 DIAGNOSIS — K5900 Constipation, unspecified: Secondary | ICD-10-CM

## 2018-05-19 DIAGNOSIS — G20 Parkinson's disease: Secondary | ICD-10-CM | POA: Diagnosis not present

## 2018-05-19 DIAGNOSIS — M810 Age-related osteoporosis without current pathological fracture: Secondary | ICD-10-CM

## 2018-05-19 DIAGNOSIS — F419 Anxiety disorder, unspecified: Secondary | ICD-10-CM

## 2018-05-19 DIAGNOSIS — G20A1 Parkinson's disease without dyskinesia, without mention of fluctuations: Secondary | ICD-10-CM

## 2018-05-19 NOTE — Progress Notes (Signed)
Location:    [facility] Room Number: 105/D Place of Service:  SNF [phone]) Provider: Einar CrowAnjali  MD  [name], [name], NP  Patient Care Team: [name], [name], NP as PCP - General (Nurse Practitioner)  Extended Emergency Contact Information Primary Emergency Contact: Renault,[name] Address: [address]          [place], [place] [address] [place] Home Phone: [phone] Relation: Spouse Secondary Emergency Contact: Vencill,[name] Address: [address]          [place], [place] [address] [place] Home Phone: [phone] Mobile Phone: [phone] Relation: Son  Code Status:  DNR Goals of care: Advanced Directive information Advanced Directives 05/19/2018  Does Patient Have a Medical Advance Directive? Yes  Type of Advance Directive Out of facility DNR (pink MOST or yellow form)  Does patient want to make changes to medical advance directive? No - Patient declined  Copy of Healthcare Power of Attorney in Chart? No - copy requested  Would patient like information on creating a medical advance directive? -  Pre-existing out of facility DNR order (yellow form or pink MOST form) -     Chief Complaint  Patient presents with  . Medical Management of Chronic Issues    Routine visit of medical management    HPI:  Pt is a 85 y.o. female seen today for medical management of chronic diseases.    Patient has a history of hypertension, Parkinson disease with hallucinations,Recurrent falls, COPD,Humerus fracture 09/18, Low back pain due to compression Fracture, Osteoporosis, Primary open angle Glaucoma. Bilateral Closed Pubic Rami fracture, h/o Fecal Impaction, Dementiaand Anxiety  Patient is Long term resident of facility. Her husband comes everyday to visit. She is mostly Wheelchair bound. She does walk with Dan HumphreysWalker and Mod Assist. She has been stable . No New nursing issues. Per her husband she had c/o Left Shoulder pain. To me  today she did not c/o of anything.Bowels moving good. Her weight is slightly low at 91 lbs. Appetite is good.  Past Medical History:  Diagnosis Date  . Arthritis    osteoporosis  . Back pain   . Chronic obstructive pulmonary disease (COPD) (HCC)   . COPD (chronic obstructive pulmonary disease) (HCC)   . Dementia (HCC)   . Gait instability   . Glaucoma   . Glaucoma   . Hypertension   . Neck pain   . Parkinson's disease (HCC)   . Tremor    Past Surgical History:  Procedure Laterality Date  . CATARACT EXTRACTION    . EYE SURGERY    . TONSILLECTOMY    . TUBAL LIGATION      Allergies  Allergen Reactions  . Hydrochlorothiazide Other (See Comments)    Reaction:  Dizziness   . Lisinopril Cough  . Other Other (See Comments)    Pt states that she is allergic to multiple BP meds -- does not recall the names.   Reaction:  Dizziness   . Spironolactone Other (See Comments)    Reaction:  Blurred vision and sweating of feet     Outpatient Encounter Medications as of 05/19/2018  Medication Sig  . acetaminophen (TYLENOL) 325 MG tablet Take 650 mg by mouth every 6 (six) hours as needed.  Marland Kitchen. aspirin EC 81 MG tablet Take 81 mg by mouth daily.  . bisacodyl (DULCOLAX) 10 MG suppository Place 1 suppository (10 mg total) rectally as needed for moderate constipation.  . busPIRone (BUSPAR) 5 MG tablet Take 2.5 mg  by mouth 2 (two) times daily.   . Carboxymethylcell-Glycerin PF (REFRESH OPTIVE PF) 0.5-0.9 % SOLN Place 1 drop into both eyes every 6 (six) hours as needed.  . docusate sodium (COLACE) 100 MG capsule Take 1 capsule (100 mg total) by mouth 2 (two) times daily.  Marland Kitchen linaclotide (LINZESS) 145 MCG CAPS capsule Take 1 capsule (145 mcg total) by mouth daily before breakfast.  . Melatonin 3 MG TABS Take 3 mg by mouth at bedtime.  . Menthol (ICY HOT ADVANCED RELIEF) 7.5 % PTCH Apply 1 patch topically every 12 (twelve) hours.  . Multiple Vitamin (MULTIVITAMIN) tablet Take 1 tablet by mouth daily.   . nitroGLYCERIN (NITROSTAT) 0.4 MG SL tablet Place 1 tablet under the tongue as directed. Place 1 tablet under the tongue every five minutes as needed for emergency chest pain  . pantoprazole (PROTONIX) 40 MG tablet Take 40 mg by mouth daily.  . polyethylene glycol (MIRALAX / GLYCOLAX) packet Take 17 g by mouth daily as needed.  . rotigotine (NEUPRO) 4 MG/24HR Place 1 patch onto the skin daily.  . timolol (BETIMOL) 0.5 % ophthalmic solution Place 1 drop into both eyes 2 (two) times daily.   No facility-administered encounter medications on file as of 05/19/2018.      Review of Systems  Constitutional: Negative.   HENT: Negative.   Respiratory: Negative.   Cardiovascular: Negative.   Genitourinary: Negative.   Musculoskeletal: Positive for arthralgias and myalgias.  Neurological: Negative.   Psychiatric/Behavioral: Negative.  Negative for agitation.     There is no immunization history on file for this patient. Pertinent  Health Maintenance Due  Topic Date Due  . PNA vac Low Risk Adult (2 of 2 - PPSV23) 01/20/2019  . INFLUENZA VACCINE  Completed  . DEXA SCAN  Completed   Fall Risk  08/05/2017 07/18/2015 03/19/2015 02/12/2015  Falls in the past year? Yes Yes No Yes  Number falls in past yr: 2 or more 1 - 2 or more  Injury with Fall? Yes No - Yes  Comment hip fracture - - left lower leg   Risk for fall due to : - (No Data) - -  Risk for fall due to: Comment - accidently hit by door that pushed her down - -   Functional Status Survey:    Vitals:   05/19/18 1109  Weight: 91 lb (41.3 kg)  Height: 4\' 8"  (1.422 m)   Body mass index is 20.4 kg/m. Physical Exam  Constitutional: She appears well-developed and well-nourished.  HENT:  Head: Normocephalic.  Mouth/Throat: Oropharynx is clear and moist.  Eyes: Pupils are equal, round, and reactive to light.  Neck: Neck supple.  Cardiovascular: Normal rate and normal heart sounds.  Pulmonary/Chest: Effort normal and breath sounds  normal.  Abdominal: Soft. Bowel sounds are normal. She exhibits no distension. There is no abdominal tenderness.  Musculoskeletal:        General: No edema.     Comments: Does resist moving her Left Shoulder and C/o Pain  Neurological: She is alert.  Follows simple Commands. Answer appropriately. Mostly stays in Wheelchair. Walks with walker and Mod Assist Increased tone in all Extremities Has resting tremor  Skin: Skin is warm and dry.  Psychiatric: She has a normal mood and affect. Her behavior is normal. Thought content normal.    Labs reviewed: Recent Labs    06/29/17 0742  12/28/17 0800 02/24/2018 1401 04/05/18 0700  NA 134*   < > 138 138 139  K 3.8   < >  4.0 4.1 4.7  CL 97*   < > 105 109 104  CO2 24   < > 28 24 27   GLUCOSE 144*   < > 94 89 98  BUN 18   < > 21 21 24*  CREATININE 0.58   < > 0.66 0.78 0.68  CALCIUM 8.8*   < > 8.7* 8.6* 9.5  MG 1.9  --   --   --   --    < > = values in this interval not displayed.   Recent Labs    08/21/17 0553 07/27/2017 1401 04/05/18 0700  AST 21 23 26   ALT 13* 15 17  ALKPHOS 67 56 65  BILITOT 0.8 1.2 1.1  PROT 6.6 7.4 8.4*  ALBUMIN 2.7* 3.4* 3.9   Recent Labs    12/28/17 0847 07/27/2017 1401 04/05/18 0700  WBC 8.8 7.8 9.1  NEUTROABS 4.5 4.1 5.5  HGB 13.0 12.9 14.7  HCT 39.8 41.0 46.9*  MCV 96.4 95.8 96.3  PLT 255 247 272   Lab Results  Component Value Date   TSH 1.419 06/16/2017   No results found for: HGBA1C No results found for: CHOL, HDL, LDLCALC, LDLDIRECT, TRIG, CHOLHDL  Significant Diagnostic Results in last 30 days:  No results found.  Assessment/Plan  Episode of Syncope in 11/19 Had Negative work up in the hospital No More since BP meds stopped Dysphagia  was evaluated by Speech and she is doing good. Age-related osteoporosiswith h/o Fractures with T score of 4.4 in 08/19 Discontinue Fosamax at request of her husband Will start her on Prolia Chronic Left Shoulder Pain Had MRI in 08/18 It showed  Supraspinatus Tendon Tear Continue Tylenol for Pain No Aggressive work up for now  H/o Fecal impaction Now on Linzess, Miralax and has been stable  End stage Parkinson Disease Stable on Neupro patch Has not tolerated sinemet before Continues to be stable on Low dose of Buspar ACP She is DNR but family wants her to be transferred to ED if needed. Family/ staff Communication:   Labs/tests ordered:

## 2018-06-08 ENCOUNTER — Encounter: Payer: Self-pay | Admitting: Adult Health

## 2018-06-08 ENCOUNTER — Non-Acute Institutional Stay (SKILLED_NURSING_FACILITY): Payer: Medicare Other | Admitting: Adult Health

## 2018-06-08 DIAGNOSIS — J410 Simple chronic bronchitis: Secondary | ICD-10-CM

## 2018-06-08 DIAGNOSIS — M545 Low back pain: Secondary | ICD-10-CM | POA: Diagnosis not present

## 2018-06-08 DIAGNOSIS — G20 Parkinson's disease: Secondary | ICD-10-CM

## 2018-06-08 DIAGNOSIS — I10 Essential (primary) hypertension: Secondary | ICD-10-CM | POA: Diagnosis not present

## 2018-06-08 DIAGNOSIS — F419 Anxiety disorder, unspecified: Secondary | ICD-10-CM

## 2018-06-08 DIAGNOSIS — G8929 Other chronic pain: Secondary | ICD-10-CM | POA: Diagnosis not present

## 2018-06-08 DIAGNOSIS — M810 Age-related osteoporosis without current pathological fracture: Secondary | ICD-10-CM

## 2018-06-08 DIAGNOSIS — K219 Gastro-esophageal reflux disease without esophagitis: Secondary | ICD-10-CM | POA: Diagnosis not present

## 2018-06-08 DIAGNOSIS — K5909 Other constipation: Secondary | ICD-10-CM | POA: Diagnosis not present

## 2018-06-08 DIAGNOSIS — H402234 Chronic angle-closure glaucoma, bilateral, indeterminate stage: Secondary | ICD-10-CM

## 2018-06-08 NOTE — Progress Notes (Signed)
Location:   [facility] Room Number: 105 D Place of Service:  SNF (31)   CODE STATUS: DNR  Allergies  Allergen Reactions  . Hydrochlorothiazide Other (See Comments)    Reaction:  Dizziness   . Lisinopril Cough  . Other Other (See Comments)    Pt states that she is allergic to multiple BP meds -- does not recall the names.   Reaction:  Dizziness   . Spironolactone Other (See Comments)    Reaction:  Blurred vision and sweating of feet     Chief Complaint  Patient presents with  . Acute Visit    Care Plan Meeting    HPI:  We have come together for her routine care plan meeting. Her spouse is present. We have discussed her anxiety: she does have episodes of anxiety; is on extremely low dose buspar. We have discussed her dementia; weight loss. He have verbalized understanding of the education given. That this is a progressive disease; without treatment for the disease itself. She is a DNR. There are no reports of uncontrolled pain; she does have periods of time when she will ask to go home on a frequent basis. Her current weight is 88 pounds.   Past Medical History:  Diagnosis Date  . Arthritis    osteoporosis  . Back pain   . Chronic obstructive pulmonary disease (COPD) (HCC)   . COPD (chronic obstructive pulmonary disease) (HCC)   . Dementia (HCC)   . Gait instability   . Glaucoma   . Glaucoma   . Hypertension   . Neck pain   . Parkinson's disease (HCC)   . Tremor     Past Surgical History:  Procedure Laterality Date  . CATARACT EXTRACTION    . EYE SURGERY    . TONSILLECTOMY    . TUBAL LIGATION      Social History   Socioeconomic History  . Marital status: Married    Spouse name: Sandra Davis  . Number of children: 5  . Years of education: 3  . Highest education level: Not on file  Occupational History  . Not on file  Social Needs  . Financial resource strain: Not on file  . Food insecurity:    Worry: Not on file    Inability:  Not on file  . Transportation needs:    Medical: Not on file    Non-medical: Not on file  Tobacco Use  . Smoking status: Never Smoker  . Smokeless tobacco: Never Used  Substance and Sexual Activity  . Alcohol use: No  . Drug use: No  . Sexual activity: Not on file  Lifestyle  . Physical activity:    Days per week: Not on file    Minutes per session: Not on file  . Stress: Not on file  Relationships  . Social connections:    Talks on phone: Not on file    Gets together: Not on file    Attends religious service: Not on file    Active member of club or organization: Not on file    Attends meetings of clubs or organizations: Not on file    Relationship status: Not on file  . Intimate partner violence:    Fear of current or ex partner: Not on file    Emotionally abused: Not on file    Physically abused: Not on file    Forced sexual activity: Not on file  Other Topics Concern  . Not on file  Social History  Narrative   Lives at home with husband, family   Caffeine use- tea 2 cups daily, decaf green tea    Family History  Problem Relation Age of Onset  . Heart failure Mother       VITAL SIGNS BP 114/63   Pulse 63   Temp (!) 97.4 F (36.3 C)   Resp 17   Ht 4\' 8"  (1.422 m)   Wt 88 lb 3.2 oz (40 kg)   BMI 19.77 kg/m   Outpatient Encounter Medications as of 06/08/2018  Medication Sig  . acetaminophen (TYLENOL) 325 MG tablet Take 650 mg by mouth every 6 (six) hours as needed.  Marland Kitchen aspirin EC 81 MG tablet Take 81 mg by mouth daily.  . bisacodyl (DULCOLAX) 10 MG suppository Place 1 suppository (10 mg total) rectally as needed for moderate constipation.  . busPIRone (BUSPAR) 5 MG tablet Take 2.5 mg by mouth 2 (two) times daily.   Marland Kitchen denosumab (PROLIA) 60 MG/ML SOSY injection Inject 60 mg into the skin every 6 (six) months. Give once a day on the 10th of every 6th month  . docusate sodium (COLACE) 100 MG capsule Take 1 capsule (100 mg total) by mouth 2 (two) times daily.  Marland Kitchen  linaclotide (LINZESS) 145 MCG CAPS capsule Take 1 capsule (145 mcg total) by mouth daily before breakfast.  . Melatonin 3 MG TABS Take 3 mg by mouth at bedtime.  . Menthol (ICY HOT BACK) 5 % PTCH Apply 1 patch topically daily. To back for lower back pain and remove after 12 hours  . Multiple Vitamin (MULTIVITAMIN) tablet Take 1 tablet by mouth daily.  . nitroGLYCERIN (NITROSTAT) 0.4 MG SL tablet Place 1 tablet under the tongue as directed. Place 1 tablet under the tongue every five minutes as needed for emergency chest pain  . NON FORMULARY Diet Type:  Regular  . Nutritional Supplements (ENSURE ENLIVE PO) Take 1 Bottle by mouth 3 (three) times daily.  . pantoprazole (PROTONIX) 40 MG tablet Take 40 mg by mouth daily.  . polyethylene glycol (MIRALAX / GLYCOLAX) packet Take 17 g by mouth daily as needed.  . rotigotine (NEUPRO) 4 MG/24HR Place 1 patch onto the skin daily.  . timolol (BETIMOL) 0.5 % ophthalmic solution Place 1 drop into both eyes 2 (two) times daily.  . [DISCONTINUED] Carboxymethylcell-Glycerin PF (REFRESH OPTIVE PF) 0.5-0.9 % SOLN Place 1 drop into both eyes every 6 (six) hours as needed.  . [DISCONTINUED] Menthol (ICY HOT ADVANCED RELIEF) 7.5 % PTCH Apply 1 patch topically every 12 (twelve) hours.   No facility-administered encounter medications on file as of 06/08/2018.      SIGNIFICANT DIAGNOSTIC EXAMS  LABS REVIEWED TODAY:   04-05-18: wbc 9.1; hgb 14.7; hc6 46.9; mcv 96.;3 plt 272  glucose 98; bun 24; creat 0.68 k+ 4.7; na++ 139 ca 9.5 liver normal albumin 3.9;   Review of Systems  Unable to perform ROS: Dementia (confusion )   Physical Exam Constitutional:      General: She is not in acute distress.    Appearance: She is well-developed. She is not diaphoretic.     Comments: Frail   Neck:     Musculoskeletal: Neck supple.     Thyroid: No thyromegaly.  Cardiovascular:     Rate and Rhythm: Normal rate and regular rhythm.     Pulses: Normal pulses.     Heart sounds:  Normal heart sounds.  Pulmonary:     Effort: Pulmonary effort is normal. No respiratory distress.  Breath sounds: Normal breath sounds.  Abdominal:     General: Bowel sounds are normal. There is no distension.     Palpations: Abdomen is soft.     Tenderness: There is no abdominal tenderness.  Musculoskeletal:     Right lower leg: No edema.     Left lower leg: No edema.     Comments: Is able to move all extremities Uses wheelchair Has stiffness present; has bilateral tremor present   Lymphadenopathy:     Cervical: No cervical adenopathy.  Skin:    General: Skin is warm and dry.  Neurological:     Mental Status: She is alert. Mental status is at baseline.  Psychiatric:        Mood and Affect: Mood normal.       ASSESSMENT/ PLAN:  TODAY:   1. Essential hypertension: is stable b/p 114/63 will continue asa 81 mg daily   2. Simple chronic bronchitis: is stable will continue to monitor her status.   3. GERD without esophagitis: is stable will continue protonix 40 mg daily   4. Chronic constipation: is stable will continue colace twice daily linzess 145 mcg daily   5. Age related osteoporosis without current pathological fracture: is stable will continue prolia 60 mg every 6 months  6. Parkinson's disease: is without change: will continue neupro 4 mcg patch daily   7. Chronic low back pain without sciatica unspecified back pain laterality: is stable will continue menthol patch daily   8. Bilateral chronic primary angle-closure glaucoma indeterminate age: is stable will continue timolol to both eyes twice daily  9. Chronic anxiety is emotionally worse: will increase buspar to 5 mg twice daily through 06-15-18 then 5 mg three times daily       MD is aware of resident's narcotic use and is in agreement with current plan of care. We will attempt to wean resident as apropriate   Synthia Innocent NP [facility]  Contact [phone] Monday through Friday 8am- 5pm    After hours call [phone]

## 2018-06-15 DIAGNOSIS — L603 Nail dystrophy: Secondary | ICD-10-CM | POA: Diagnosis not present

## 2018-06-15 DIAGNOSIS — B351 Tinea unguium: Secondary | ICD-10-CM | POA: Diagnosis not present

## 2018-06-15 DIAGNOSIS — I739 Peripheral vascular disease, unspecified: Secondary | ICD-10-CM | POA: Diagnosis not present

## 2018-06-15 DIAGNOSIS — G629 Polyneuropathy, unspecified: Secondary | ICD-10-CM | POA: Diagnosis not present

## 2018-06-16 DIAGNOSIS — G8929 Other chronic pain: Secondary | ICD-10-CM | POA: Insufficient documentation

## 2018-06-16 DIAGNOSIS — M545 Low back pain: Secondary | ICD-10-CM

## 2018-06-16 DIAGNOSIS — M810 Age-related osteoporosis without current pathological fracture: Secondary | ICD-10-CM | POA: Insufficient documentation

## 2018-06-16 DIAGNOSIS — F419 Anxiety disorder, unspecified: Secondary | ICD-10-CM | POA: Insufficient documentation

## 2018-06-16 DIAGNOSIS — H402230 Chronic angle-closure glaucoma, bilateral, stage unspecified: Secondary | ICD-10-CM | POA: Insufficient documentation

## 2018-06-16 DIAGNOSIS — H402234 Chronic angle-closure glaucoma, bilateral, indeterminate stage: Secondary | ICD-10-CM | POA: Insufficient documentation

## 2018-06-16 DIAGNOSIS — K219 Gastro-esophageal reflux disease without esophagitis: Secondary | ICD-10-CM | POA: Insufficient documentation

## 2018-06-20 ENCOUNTER — Non-Acute Institutional Stay (SKILLED_NURSING_FACILITY): Payer: Medicare Other | Admitting: Adult Health

## 2018-06-20 ENCOUNTER — Encounter: Payer: Self-pay | Admitting: Adult Health

## 2018-06-20 DIAGNOSIS — M810 Age-related osteoporosis without current pathological fracture: Secondary | ICD-10-CM

## 2018-06-20 DIAGNOSIS — K5909 Other constipation: Secondary | ICD-10-CM | POA: Diagnosis not present

## 2018-06-20 DIAGNOSIS — G20 Parkinson's disease: Secondary | ICD-10-CM | POA: Diagnosis not present

## 2018-06-20 NOTE — Progress Notes (Signed)
Location:   [facility] Room Number: 105 D Place of Service:  SNF (31)   CODE STATUS: DNR  Allergies  Allergen Reactions  . Hydrochlorothiazide Other (See Comments)    Reaction:  Dizziness   . Lisinopril Cough  . Other Other (See Comments)    Pt states that she is allergic to multiple BP meds -- does not recall the names.   Reaction:  Dizziness   . Spironolactone Other (See Comments)    Reaction:  Blurred vision and sweating of feet     Chief Complaint  Patient presents with  . Medical Management of Chronic Issues    Parkinson's disease; chronic constipation; age related osteoporosis without current pathological fracture.     HPI:  She is a 85 year old long term resident of this facility being seen for the management of her chronic illnesses: parkinson disease; constipation; osteoporosis. There are no reports of changes in appetite; no uncontrolled pain; no uncontrolled anxiety and no agitation.   Past Medical History:  Diagnosis Date  . Arthritis    osteoporosis  . Back pain   . Chronic obstructive pulmonary disease (COPD) (HCC)   . COPD (chronic obstructive pulmonary disease) (HCC)   . Dementia (HCC)   . Gait instability   . Glaucoma   . Glaucoma   . Hypertension   . Neck pain   . Parkinson's disease (HCC)   . Tremor     Past Surgical History:  Procedure Laterality Date  . CATARACT EXTRACTION    . EYE SURGERY    . TONSILLECTOMY    . TUBAL LIGATION      Social History   Socioeconomic History  . Marital status: Married    Spouse name: Normand Sloop  . Number of children: 5  . Years of education: 3  . Highest education level: Not on file  Occupational History  . Not on file  Social Needs  . Financial resource strain: Not on file  . Food insecurity:    Worry: Not on file    Inability: Not on file  . Transportation needs:    Medical: Not on file    Non-medical: Not on file  Tobacco Use  . Smoking status: Never Smoker  .  Smokeless tobacco: Never Used  Substance and Sexual Activity  . Alcohol use: No  . Drug use: No  . Sexual activity: Not on file  Lifestyle  . Physical activity:    Days per week: Not on file    Minutes per session: Not on file  . Stress: Not on file  Relationships  . Social connections:    Talks on phone: Not on file    Gets together: Not on file    Attends religious service: Not on file    Active member of club or organization: Not on file    Attends meetings of clubs or organizations: Not on file    Relationship status: Not on file  . Intimate partner violence:    Fear of current or ex partner: Not on file    Emotionally abused: Not on file    Physically abused: Not on file    Forced sexual activity: Not on file  Other Topics Concern  . Not on file  Social History Narrative   Lives at home with husband, family   Caffeine use- tea 2 cups daily, decaf Curtis Uriarte tea    Family History  Problem Relation Age of Onset  . Heart failure Mother  VITAL SIGNS BP (!) 159/89   Pulse 80   Temp 98 F (36.7 C)   Resp 18   Ht  (1.422 m)   Wt 90 lb 9.6 oz (41.1 kg)   BMI 20.31 kg/m   Outpatient Encounter Medications as of 06/20/2018  Medication Sig  . acetaminophen (TYLENOL) 325 MG tablet Take 650 mg by mouth every 6 (six) hours as needed.  Marland Kitchen aspirin EC 81 MG tablet Take 81 mg by mouth daily.  . bisacodyl (DULCOLAX) 10 MG suppository Place 1 suppository (10 mg total) rectally as needed for moderate constipation.  . busPIRone (BUSPAR) 5 MG tablet Take 5 mg by mouth 3 (three) times daily.   Marland Kitchen denosumab (PROLIA) 60 MG/ML SOSY injection Inject 60 mg into the skin every 6 (six) months. Give once a day on the 10th of every 6th month  . docusate sodium (COLACE) 100 MG capsule Take 1 capsule (100 mg total) by mouth 2 (two) times daily.  Marland Kitchen linaclotide (LINZESS) 145 MCG CAPS capsule Take 1 capsule (145 mcg total) by mouth daily before breakfast.  . Melatonin 3 MG TABS Take 3 mg by  mouth at bedtime.  . Menthol (ICY HOT BACK) 5 % PTCH Apply 1 patch topically daily. To back for lower back pain and remove after 12 hours  . Multiple Vitamin (MULTIVITAMIN) tablet Take 1 tablet by mouth daily.  . nitroGLYCERIN (NITROSTAT) 0.4 MG SL tablet Place 1 tablet under the tongue as directed. Place 1 tablet under the tongue every five minutes as needed for emergency chest pain  . NON FORMULARY Diet Type:  Regular  . Nutritional Supplements (ENSURE ENLIVE PO) Take 1 Bottle by mouth 3 (three) times daily.  . pantoprazole (PROTONIX) 40 MG tablet Take 40 mg by mouth daily.  . polyethylene glycol (MIRALAX / GLYCOLAX) packet Take 17 g by mouth daily as needed.  . rotigotine (NEUPRO) 4 MG/24HR Place 1 patch onto the skin daily.  . timolol (BETIMOL) 0.5 % ophthalmic solution Place 1 drop into both eyes 2 (two) times daily.   No facility-administered encounter medications on file as of 06/20/2018.      SIGNIFICANT DIAGNOSTIC EXAMS  LABS REVIEWED PREVIOUS:   04-05-18: wbc 9.1; hgb 14.7; hc6 46.9; mcv 96.;3 plt 272  glucose 98; bun 24; creat 0.68 k+ 4.7; na++ 139 ca 9.5 liver normal albumin 3.9;   NO NEW LABS.   Review of Systems  Unable to perform ROS: Dementia (unable to participate )   Physical Exam Constitutional:      General: She is not in acute distress.    Appearance: She is well-developed. She is not diaphoretic.     Comments: Frail   Neck:     Musculoskeletal: Neck supple.     Thyroid: No thyromegaly.  Cardiovascular:     Rate and Rhythm: Normal rate and regular rhythm.     Pulses: Normal pulses.     Heart sounds: Normal heart sounds.  Pulmonary:     Effort: Pulmonary effort is normal. No respiratory distress.     Breath sounds: Normal breath sounds.  Abdominal:     General: Bowel sounds are normal. There is no distension.     Palpations: Abdomen is soft.     Tenderness: There is no abdominal tenderness.  Musculoskeletal:     Right lower leg: No edema.     Left  lower leg: No edema.     Comments:  Is able to move all extremities Uses wheelchair Has stiffness  present; has bilateral tremor present    Lymphadenopathy:     Cervical: No cervical adenopathy.  Skin:    General: Skin is warm and dry.  Neurological:     Mental Status: She is alert. Mental status is at baseline.  Psychiatric:        Mood and Affect: Mood normal.     ASSESSMENT/ PLAN:  TODAY:   1. Chronic constipation: is stable will continue colace twice daily and linzess 145 mcg daily and has miralax prn   2. Age related osteoporosis without current pathological fracture: is stable will continue prolia 60 mg every 6 months   3. Parkinson's disease: is without change will continue neupro 4 mcg patch daily   PREVIOUS   4. Essential hypertension: is stable b/p 159/89  will continue asa 81 mg daily   5. Simple chronic bronchitis: is stable will continue to monitor her status.   6. GERD without esophagitis: is stable will continue protonix 40 mg daily   7. Chronic low back pain without sciatica unspecified back pain laterality: is stable will continue menthol patch daily   8. Bilateral chronic primary angle-closure glaucoma indeterminate age: is stable will continue timolol to both eyes twice daily  9. Chronic anxiety is emotionally stable will continue buspar 5 mg three times daily     MD is aware of resident's narcotic use and is in agreement with current plan of care. We will attempt to wean resident as apropriate   Synthia Innocent NP [facility]  Contact [phone] Monday through Friday 8am- 5pm  After hours call [phone]

## 2018-07-26 ENCOUNTER — Non-Acute Institutional Stay (SKILLED_NURSING_FACILITY): Payer: Medicare Other | Admitting: Adult Health

## 2018-07-26 ENCOUNTER — Encounter: Payer: Self-pay | Admitting: Adult Health

## 2018-07-26 DIAGNOSIS — I10 Essential (primary) hypertension: Secondary | ICD-10-CM

## 2018-07-26 DIAGNOSIS — K219 Gastro-esophageal reflux disease without esophagitis: Secondary | ICD-10-CM

## 2018-07-26 DIAGNOSIS — J410 Simple chronic bronchitis: Secondary | ICD-10-CM

## 2018-07-26 NOTE — Progress Notes (Signed)
Location:   [facility] Room Number: 105 D Place of Service:  SNF (31)   CODE STATUS: DNR  Allergies  Allergen Reactions  . Hydrochlorothiazide Other (See Comments)    Reaction:  Dizziness   . Lisinopril Cough  . Other Other (See Comments)    Pt states that she is allergic to multiple BP meds -- does not recall the names.   Reaction:  Dizziness   . Spironolactone Other (See Comments)    Reaction:  Blurred vision and sweating of feet     Chief Complaint  Patient presents with  . Medical Management of Chronic Issues    Essential hypertension; simple chronic bronchitis; gerd without esophagitis.     HPI:  She is a 85 year old long term resident of this facility being seen for the management of her chronic illnesses: hypertension; chronic bronchitis; gerd. There are no reports of cough or shortness of breath; no reports of heart burn present. No reports of uncontrolled pain;no reports of fevers present.   Past Medical History:  Diagnosis Date  . Arthritis    osteoporosis  . Back pain   . Chronic obstructive pulmonary disease (COPD) (HCC)   . COPD (chronic obstructive pulmonary disease) (HCC)   . Dementia (HCC)   . Gait instability   . Glaucoma   . Glaucoma   . Hypertension   . Neck pain   . Parkinson's disease (HCC)   . Tremor     Past Surgical History:  Procedure Laterality Date  . CATARACT EXTRACTION    . EYE SURGERY    . TONSILLECTOMY    . TUBAL LIGATION      Social History   Socioeconomic History  . Marital status: Married    Spouse name: Sandra Davis  . Number of children: 5  . Years of education: 3  . Highest education level: Not on file  Occupational History  . Not on file  Social Needs  . Financial resource strain: Not on file  . Food insecurity:    Worry: Not on file    Inability: Not on file  . Transportation needs:    Medical: Not on file    Non-medical: Not on file  Tobacco Use  . Smoking status: Never Smoker   . Smokeless tobacco: Never Used  Substance and Sexual Activity  . Alcohol use: No  . Drug use: No  . Sexual activity: Not on file  Lifestyle  . Physical activity:    Days per week: Not on file    Minutes per session: Not on file  . Stress: Not on file  Relationships  . Social connections:    Talks on phone: Not on file    Gets together: Not on file    Attends religious service: Not on file    Active member of club or organization: Not on file    Attends meetings of clubs or organizations: Not on file    Relationship status: Not on file  . Intimate partner violence:    Fear of current or ex partner: Not on file    Emotionally abused: Not on file    Physically abused: Not on file    Forced sexual activity: Not on file  Other Topics Concern  . Not on file  Social History Narrative   Lives at home with husband, family   Caffeine use- tea 2 cups daily, decaf [name] tea    Family History  Problem Relation Age of Onset  .  Heart failure Mother       VITAL SIGNS BP (!) 145/65   Pulse 65   Temp (!) 96.8 F (36 C)   Resp 18   Ht  (1.422 m)   Wt 89 lb 12.8 oz (40.7 kg)   BMI 20.13 kg/m   Outpatient Encounter Medications as of 07/26/2018  Medication Sig  . acetaminophen (TYLENOL) 325 MG tablet Take 650 mg by mouth every 6 (six) hours as needed.  Marland Kitchen aspirin EC 81 MG tablet Take 81 mg by mouth daily.  . bisacodyl (DULCOLAX) 10 MG suppository Place 1 suppository (10 mg total) rectally as needed for moderate constipation.  . busPIRone (BUSPAR) 5 MG tablet Take 5 mg by mouth 3 (three) times daily.   Marland Kitchen denosumab (PROLIA) 60 MG/ML SOSY injection Inject 60 mg into the skin every 6 (six) months. Give once a day on the 10th of every 6th month  . docusate sodium (COLACE) 100 MG capsule Take 1 capsule (100 mg total) by mouth 2 (two) times daily.  Marland Kitchen linaclotide (LINZESS) 145 MCG CAPS capsule Take 1 capsule (145 mcg total) by mouth daily before breakfast.  . Melatonin 3 MG TABS Take 3  mg by mouth at bedtime.  . Menthol (ICY HOT BACK) 5 % PTCH Apply 1 patch topically daily. To back for lower back pain and remove after 12 hours  . Multiple Vitamin (MULTIVITAMIN) tablet Take 1 tablet by mouth daily.  . nitroGLYCERIN (NITROSTAT) 0.4 MG SL tablet Place 1 tablet under the tongue as directed. Place 1 tablet under the tongue every five minutes as needed for emergency chest pain  . NON FORMULARY Diet Type:  Regular  . Nutritional Supplements (ENSURE ENLIVE PO) Take 1 Bottle by mouth 3 (three) times daily.  . pantoprazole (PROTONIX) 40 MG tablet Take 40 mg by mouth daily.  . polyethylene glycol (MIRALAX / GLYCOLAX) packet Take 17 g by mouth daily as needed.  . rotigotine (NEUPRO) 4 MG/24HR Place 1 patch onto the skin daily.  . timolol (BETIMOL) 0.5 % ophthalmic solution Place 1 drop into both eyes 2 (two) times daily.   No facility-administered encounter medications on file as of 07/26/2018.      SIGNIFICANT DIAGNOSTIC EXAMS  LABS REVIEWED PREVIOUS:   04-05-18: wbc 9.1; hgb 14.7; hc6 46.9; mcv 96.;3 plt 272  glucose 98; bun 24; creat 0.68 k+ 4.7; na++ 139 ca 9.5 liver normal albumin 3.9;   NO NEW LABS.   Review of Systems  Unable to perform ROS: Dementia (unable to participate )   Physical Exam Constitutional:      General: She is not in acute distress.    Appearance: She is underweight. She is not diaphoretic.  Eyes:     Comments: History of cataract surgery   Neck:     Musculoskeletal: Neck supple.     Thyroid: No thyromegaly.  Cardiovascular:     Rate and Rhythm: Normal rate and regular rhythm.     Pulses: Normal pulses.     Heart sounds: Normal heart sounds.  Pulmonary:     Effort: Pulmonary effort is normal. No respiratory distress.     Breath sounds: Normal breath sounds.  Abdominal:     General: Bowel sounds are normal. There is no distension.     Palpations: Abdomen is soft.     Tenderness: There is no abdominal tenderness.  Musculoskeletal:     Right  lower leg: No edema.     Left lower leg: No edema.  Comments: Is able to move all extremities Uses wheelchair Has stiffness present; has bilateral tremor present     Lymphadenopathy:     Cervical: No cervical adenopathy.  Skin:    General: Skin is warm and dry.  Neurological:     Mental Status: She is alert. Mental status is at baseline.  Psychiatric:        Mood and Affect: Mood normal.     ASSESSMENT/ PLAN:  TODAY:   1. Essential hypertension: is stable b/p 145/60 will continue asa 81 mg daily  2. Simple chronic bronchitis: is stable will continue to monitor her status.   3. GERD without esophagitis: is stable will continue protonix 40 mg daily   PREVIOUS   4. Chronic low back pain without sciatica unspecified back pain laterality: is stable will continue menthol patch daily   5. Bilateral chronic primary angle-closure glaucoma indeterminate age: is stable will continue timolol to both eyes twice daily  6. Chronic anxiety is emotionally stable will continue buspar 5 mg three times daily   7. Chronic constipation: is stable will continue colace twice daily and linzess 145 mcg daily and has miralax prn   8. Age related osteoporosis without current pathological fracture: is stable will continue prolia 60 mg every 6 months   9. Parkinson's disease: is without change will continue neupro 4 mcg patch daily      MD is aware of resident's narcotic use and is in agreement with current plan of care. We will attempt to wean resident as apropriate   Synthia Innocent NP [facility]  Contact [phone] Monday through Friday 8am- 5pm  After hours call [phone]

## 2018-08-29 ENCOUNTER — Non-Acute Institutional Stay (SKILLED_NURSING_FACILITY): Payer: Medicare Other | Admitting: Internal Medicine

## 2018-08-29 ENCOUNTER — Encounter: Payer: Self-pay | Admitting: Internal Medicine

## 2018-08-29 DIAGNOSIS — I10 Essential (primary) hypertension: Secondary | ICD-10-CM

## 2018-08-29 DIAGNOSIS — M810 Age-related osteoporosis without current pathological fracture: Secondary | ICD-10-CM | POA: Diagnosis not present

## 2018-08-29 DIAGNOSIS — R296 Repeated falls: Secondary | ICD-10-CM

## 2018-08-29 DIAGNOSIS — G20 Parkinson's disease: Secondary | ICD-10-CM

## 2018-08-29 NOTE — Progress Notes (Signed)
Location:  [facility] Room Number: 105 D Place of Service:  SNF [phone](31) Provider:  Einar CrowAnjali Yumi Insalaco, MD  [name], [name], NP  Patient Care Team: [name], [name], NP as PCP - General (Nurse Practitioner)  Extended Emergency Contact Information Primary Emergency Contact: Ryner,[name] Address: [address]          [place], [place] [address] [place] Home Phone: [phone] Relation: Spouse Secondary Emergency Contact: Nadel,[name] Address: [address]          [place], [place] [address] [place] Home Phone: [phone] Mobile Phone: [phone] Relation: Son  Code Status:  DNR Goals of care: Advanced Directive information Advanced Directives 08/29/2018  Does Patient Have a Medical Advance Directive? Yes  Type of Advance Directive Out of facility DNR (pink MOST or yellow form)  Does patient want to make changes to medical advance directive? No - Patient declined  Copy of Healthcare Power of Attorney in Chart? -  Would patient like information on creating a medical advance directive? -  Pre-existing out of facility DNR order (yellow form or pink MOST form) Yellow form placed in chart (order not valid for inpatient use)     Chief Complaint  Patient presents with  . Medical Management of Chronic Issues    Hypertension, COPD    HPI:  Pt is a 85 y.o. female seen today for medical management of chronic diseases.    Patient has a history of hypertension, Parkinson disease with hallucinations,Recurrent falls, COPD, Low back pain due to compression Fracture, Osteoporosis, Primary open angle Glaucoma. Bilateral Closed Pubic Rami fracture, h/o Fecal Impaction, Dementiaand Anxiety She is Long term resident of facility Her weight is again slightly less at 88 lbs from 91 lbs But nurses say that she is doing well Her husband cannot come to visit her right now. That was her only issue today that she wants to see him. No Acute  issues  Past Medical History:  Diagnosis Date  . Arthritis    osteoporosis  . Back pain   . Chronic obstructive pulmonary disease (COPD) (HCC)   . COPD (chronic obstructive pulmonary disease) (HCC)   . Dementia (HCC)   . Gait instability   . Glaucoma   . Glaucoma   . Hypertension   . Neck pain   . Parkinson's disease (HCC)   . Tremor    Past Surgical History:  Procedure Laterality Date  . CATARACT EXTRACTION    . EYE SURGERY    . TONSILLECTOMY    . TUBAL LIGATION      Allergies  Allergen Reactions  . Hydrochlorothiazide Other (See Comments)    Reaction:  Dizziness   . Lisinopril Cough  . Other Other (See Comments)    Pt states that she is allergic to multiple BP meds -- does not recall the names.   Reaction:  Dizziness   . Spironolactone Other (See Comments)    Reaction:  Blurred vision and sweating of feet     Outpatient Encounter Medications as of 08/29/2018  Medication Sig  . acetaminophen (TYLENOL) 325 MG tablet Take 650 mg by mouth every 6 (six) hours as needed.  Marland Kitchen. aspirin EC 81 MG tablet Take 81 mg by mouth daily.  . bisacodyl (DULCOLAX) 10 MG suppository Place 1 suppository (10 mg total) rectally as needed for moderate constipation.  . busPIRone (BUSPAR) 5 MG tablet Take 5 mg by mouth 3 (three) times daily.   Marland Kitchen. denosumab (PROLIA) 60 MG/ML SOSY  injection Inject 60 mg into the skin every 6 (six) months. Give once a day on the 10th of every 6th month  . docusate sodium (COLACE) 100 MG capsule Take 1 capsule (100 mg total) by mouth 2 (two) times daily.  Marland Kitchen linaclotide (LINZESS) 145 MCG CAPS capsule Take 1 capsule (145 mcg total) by mouth daily before breakfast.  . Melatonin 3 MG TABS Take 3 mg by mouth at bedtime.  . Menthol (ICY HOT BACK) 5 % PTCH Apply 1 patch topically daily. To back for lower back pain and remove after 12 hours  . Multiple Vitamin (MULTIVITAMIN) tablet Take 1 tablet by mouth daily.  . nitroGLYCERIN (NITROSTAT) 0.4 MG SL tablet Place 1 tablet  under the tongue as directed. Place 1 tablet under the tongue every five minutes as needed for emergency chest pain  . NON FORMULARY Diet Type:  Regular  . Nutritional Supplements (ENSURE ENLIVE PO) Take 1 Bottle by mouth 3 (three) times daily.  . pantoprazole (PROTONIX) 40 MG tablet Take 40 mg by mouth daily.  . polyethylene glycol (MIRALAX / GLYCOLAX) packet Take 17 g by mouth daily as needed.  . rotigotine (NEUPRO) 4 MG/24HR Place 1 patch onto the skin daily.  . timolol (BETIMOL) 0.5 % ophthalmic solution Place 1 drop into both eyes 2 (two) times daily.   No facility-administered encounter medications on file as of 08/29/2018.     Review of Systems  Constitutional: Negative.   HENT: Negative.   Respiratory: Negative.   Cardiovascular: Negative.   Gastrointestinal: Negative.   Genitourinary: Negative.   Musculoskeletal: Positive for arthralgias.  Skin: Negative.   Neurological: Positive for tremors and weakness.  Psychiatric/Behavioral: Positive for dysphoric mood.    Immunization History  Administered Date(s) Administered  . Influenza, High Dose Seasonal PF 01/29/2016  . Influenza-Unspecified 01/29/2011, 01/27/2012, 02/02/2013, 01/16/2014, 01/13/2018  . Pneumococcal Conjugate-13 02/20/2014, 01/19/2018  . Pneumococcal Polysaccharide-23 05/05/2012  . Pneumococcal-Unspecified 01/19/2018  . Tdap 02/20/2014, 01/19/2018   Pertinent  Health Maintenance Due  Topic Date Due  . INFLUENZA VACCINE  11/12/2018  . DEXA SCAN  Completed  . PNA vac Low Risk Adult  Completed   Fall Risk  08/05/2017 07/18/2015 03/19/2015 02/12/2015  Falls in the past year? Yes Yes No Yes  Number falls in past yr: 2 or more 1 - 2 or more  Injury with Fall? Yes No - Yes  Comment hip fracture - - left lower leg   Risk for fall due to : - (No Data) - -  Risk for fall due to: Comment - accidently hit by door that pushed her down - -   Functional Status Survey:    Vitals:   08/29/18 1124  BP: (!) 150/83   Pulse: (!) 59  Resp: 17  Temp: 98.7 F (37.1 C)  Weight: 88 lb 9.6 oz (40.2 kg)  Height: 4\' 8"  (1.422 m)   Body mass index is 19.86 kg/m. Physical Exam Vitals signs reviewed.  Constitutional:      Appearance: Normal appearance.  HENT:     Head: Normocephalic.     Nose: Nose normal.     Mouth/Throat:     Mouth: Mucous membranes are moist.     Pharynx: Oropharynx is clear.  Eyes:     Pupils: Pupils are equal, round, and reactive to light.  Neck:     Musculoskeletal: Neck supple.  Cardiovascular:     Rate and Rhythm: Normal rate and regular rhythm.     Pulses: Normal pulses.  Heart sounds: Normal heart sounds.  Pulmonary:     Effort: Pulmonary effort is normal.     Breath sounds: Normal breath sounds.  Abdominal:     General: Abdomen is flat. Bowel sounds are normal.     Palpations: Abdomen is soft.  Musculoskeletal:        General: No swelling.  Skin:    General: Skin is warm and dry.  Neurological:     General: No focal deficit present.     Mental Status: She is alert.     Comments: Her Mental status is at her Baseline. Follows Commands and has no Focal deficits . She answers Appropriately. Follows simple commands    Psychiatric:        Mood and Affect: Mood normal.        Thought Content: Thought content normal.     Labs reviewed: Recent Labs    12/28/17 0800 03-22-18 1401 04/05/18 0700  NA 138 138 139  K 4.0 4.1 4.7  CL 105 109 104  CO2 GLUCOSE 94 89 98  BUN 21 21 24*  CREATININE 0.66 0.78 0.68  CALCIUM 8.7* 8.6* 9.5   Recent Labs    Mar 22, 2018 1401 04/05/18 0700  AST 23 26  ALT 15 17  ALKPHOS 56 65  BILITOT 1.2 1.1  PROT 7.4 8.4*  ALBUMIN 3.4* 3.9   Recent Labs    12/28/17 0847 03-22-18 1401 04/05/18 0700  WBC 8.8 7.8 9.1  NEUTROABS 4.5 4.1 5.5  HGB 13.0 12.9 14.7  HCT 39.8 41.0 46.9*  MCV 96.4 95.8 96.3  PLT 255 247 272   Lab Results  Component Value Date   TSH 1.419 06/16/2017   No results found for: HGBA1C  No results found for: CHOL, HDL, LDLCALC, LDLDIRECT, TRIG, CHOLHDL  Significant Diagnostic Results in last 30 days:  No results found.  Assessment/Plan Essential hypertension Has h/o Syncope in past Will be careful with Antihypertensive Continue to monitor for now Age-related osteoporosiswith h/o Fractures with T score of 4.4 in 08/19 On Prolia Also will start her on calcium and Vit D  H/o Fecal impaction Now on Linzess, Miralax and has been stable  End stage Parkinson Disease Stable on Neupro patch Has not tolerated sinemet before Continues to be stable on Low dose of Buspar Chronic Left Shoulder Pain Had MRI in 08/18 It showed Supraspinatus Tendon Tear Continue Tylenol for Pain No Aggressive work up for now ACP She is DNR but family wants her to be transferred to ED if needed.  Family/ staff Communication:   Labs/tests ordered:  CMP,CBC  Total time spent in this patient care encounter was  25_  minutes; greater than 50% of the visit spent counseling patient and staff, reviewing records , Labs and coordinating care for problems addressed at this encounter.

## 2018-08-31 ENCOUNTER — Encounter: Payer: Self-pay | Admitting: Adult Health

## 2018-08-31 ENCOUNTER — Non-Acute Institutional Stay (SKILLED_NURSING_FACILITY): Payer: Medicare Other | Admitting: Adult Health

## 2018-08-31 DIAGNOSIS — F0281 Dementia in other diseases classified elsewhere with behavioral disturbance: Secondary | ICD-10-CM

## 2018-08-31 DIAGNOSIS — G20 Parkinson's disease: Secondary | ICD-10-CM | POA: Diagnosis not present

## 2018-08-31 DIAGNOSIS — F02818 Dementia in other diseases classified elsewhere, unspecified severity, with other behavioral disturbance: Secondary | ICD-10-CM

## 2018-08-31 NOTE — Progress Notes (Signed)
Location:   [facility] Room Number: 105 D Place of Service:  SNF (31)   CODE STATUS: DNR  Allergies  Allergen Reactions  . Hydrochlorothiazide Other (See Comments)    Reaction:  Dizziness   . Lisinopril Cough  . Other Other (See Comments)    Pt states that she is allergic to multiple BP meds -- does not recall the names.   Reaction:  Dizziness   . Spironolactone Other (See Comments)    Reaction:  Blurred vision and sweating of feet     Chief Complaint  Patient presents with  . Acute Visit    Care Plan Meeting    HPI:  We have come together for a routine care plan meeting she does have family present via phone. She has lost 8 pounds in the past 6 months. She does yell out at times. There are no reports of uncontrolled pain. There are no reports of fevers present. She is aware of her surroundings.    Past Medical History:  Diagnosis Date  . Arthritis    osteoporosis  . Back pain   . Chronic obstructive pulmonary disease (COPD) (HCC)   . COPD (chronic obstructive pulmonary disease) (HCC)   . Dementia (HCC)   . Gait instability   . Glaucoma   . Glaucoma   . Hypertension   . Neck pain   . Parkinson's disease (HCC)   . Tremor     Past Surgical History:  Procedure Laterality Date  . CATARACT EXTRACTION    . EYE SURGERY    . TONSILLECTOMY    . TUBAL LIGATION      Social History   Socioeconomic History  . Marital status: Married    Spouse name: Normand Sloop  . Number of children: 5  . Years of education: 3  . Highest education level: Not on file  Occupational History  . Not on file  Social Needs  . Financial resource strain: Not on file  . Food insecurity:    Worry: Not on file    Inability: Not on file  . Transportation needs:    Medical: Not on file    Non-medical: Not on file  Tobacco Use  . Smoking status: Never Smoker  . Smokeless tobacco: Never Used  Substance and Sexual Activity  . Alcohol use: No  . Drug use: No   . Sexual activity: Not on file  Lifestyle  . Physical activity:    Days per week: Not on file    Minutes per session: Not on file  . Stress: Not on file  Relationships  . Social connections:    Talks on phone: Not on file    Gets together: Not on file    Attends religious service: Not on file    Active member of club or organization: Not on file    Attends meetings of clubs or organizations: Not on file    Relationship status: Not on file  . Intimate partner violence:    Fear of current or ex partner: Not on file    Emotionally abused: Not on file    Physically abused: Not on file    Forced sexual activity: Not on file  Other Topics Concern  . Not on file  Social History Narrative   Lives at home with husband, family   Caffeine use- tea 2 cups daily, decaf green tea    Family History  Problem Relation Age of Onset  . Heart failure Mother  VITAL SIGNS BP (!) 150/83   Pulse (!) 59   Temp 98.3 F (36.8 C)   Resp 17   Ht 4\' 8"  (1.422 m)   Wt 87 lb 3.2 oz (39.6 kg)   BMI 19.55 kg/m   Outpatient Encounter Medications as of 08/31/2018  Medication Sig  . acetaminophen (TYLENOL) 325 MG tablet Take 650 mg by mouth every 6 (six) hours as needed.  Marland Kitchen aspirin EC 81 MG tablet Take 81 mg by mouth daily.  . bisacodyl (DULCOLAX) 10 MG suppository Place 1 suppository (10 mg total) rectally as needed for moderate constipation.  . busPIRone (BUSPAR) 5 MG tablet Take 5 mg by mouth 3 (three) times daily.   Marland Kitchen denosumab (PROLIA) 60 MG/ML SOSY injection Inject 60 mg into the skin every 6 (six) months. Give once a day on the 10th of every 6th month  . docusate sodium (COLACE) 100 MG capsule Take 1 capsule (100 mg total) by mouth 2 (two) times daily.  Marland Kitchen linaclotide (LINZESS) 145 MCG CAPS capsule Take 1 capsule (145 mcg total) by mouth daily before breakfast.  . Melatonin 3 MG TABS Take 3 mg by mouth at bedtime.  . Menthol (ICY HOT BACK) 5 % PTCH Apply 1 patch topically daily. To back  for lower back pain and remove after 12 hours  . Multiple Vitamin (MULTIVITAMIN) tablet Take 1 tablet by mouth daily.  . nitroGLYCERIN (NITROSTAT) 0.4 MG SL tablet Place 1 tablet under the tongue as directed. Place 1 tablet under the tongue every five minutes as needed for emergency chest pain  . NON FORMULARY Diet Type:  Regular  . Nutritional Supplements (ENSURE ENLIVE PO) Take 1 Bottle by mouth 3 (three) times daily.  . pantoprazole (PROTONIX) 40 MG tablet Take 40 mg by mouth daily.  . polyethylene glycol (MIRALAX / GLYCOLAX) packet Take 17 g by mouth daily as needed.  . rotigotine (NEUPRO) 4 MG/24HR Place 1 patch onto the skin daily.  . timolol (BETIMOL) 0.5 % ophthalmic solution Place 1 drop into both eyes 2 (two) times daily.   No facility-administered encounter medications on file as of 08/31/2018.      SIGNIFICANT DIAGNOSTIC EXAMS  LABS REVIEWED PREVIOUS:   04-05-18: wbc 9.1; hgb 14.7; hc6 46.9; mcv 96.;3 plt 272  glucose 98; bun 24; creat 0.68 k+ 4.7; na++ 139 ca 9.5 liver normal albumin 3.9;   NO NEW LABS.    Review of Systems  Unable to perform ROS: Dementia (unable to participate )    Physical Exam Constitutional:      General: She is not in acute distress.    Appearance: She is underweight. She is not diaphoretic.  Eyes:     Comments: History of cataract surgery   Neck:     Musculoskeletal: Neck supple.     Thyroid: No thyromegaly.  Cardiovascular:     Rate and Rhythm: Normal rate and regular rhythm.     Pulses: Normal pulses.     Heart sounds: Normal heart sounds.  Pulmonary:     Effort: Pulmonary effort is normal. No respiratory distress.     Breath sounds: Normal breath sounds.  Abdominal:     General: Bowel sounds are normal. There is no distension.     Palpations: Abdomen is soft.     Tenderness: There is no abdominal tenderness.  Musculoskeletal:     Right lower leg: No edema.     Left lower leg: No edema.     Comments:  Is able to  move all  extremities Uses wheelchair Has stiffness present; has bilateral tremor present      Lymphadenopathy:     Cervical: No cervical adenopathy.  Skin:    General: Skin is warm and dry.  Neurological:     Mental Status: She is alert. Mental status is at baseline.  Psychiatric:        Mood and Affect: Mood normal.     ASSESSMENT/ PLAN:  TODAY:   1. Parkinson's disease 2. Dementia due to parkinson's disease with behavioral disturbance  Will continue current plan of care Will continue current medication regimen Will continue to monitor her status.      MD is aware of resident's narcotic use and is in agreement with current plan of care. We will attempt to wean resident as apropriate   Synthia Innocenteborah Green NP [facility]  Contact [phone] Monday through Friday 8am- 5pm  After hours call [phone]

## 2018-09-02 ENCOUNTER — Encounter (HOSPITAL_COMMUNITY)
Admission: RE | Admit: 2018-09-02 | Discharge: 2018-09-02 | Disposition: A | Discharge status: Home / Self Care | Payer: Medicare Other | Source: Skilled Nursing Facility | Attending: Internal Medicine | Admitting: Internal Medicine

## 2018-09-02 DIAGNOSIS — F419 Anxiety disorder, unspecified: Secondary | ICD-10-CM | POA: Insufficient documentation

## 2018-09-02 DIAGNOSIS — G20 Parkinson's disease: Secondary | ICD-10-CM | POA: Diagnosis not present

## 2018-09-02 DIAGNOSIS — K219 Gastro-esophageal reflux disease without esophagitis: Secondary | ICD-10-CM | POA: Insufficient documentation

## 2018-09-02 DIAGNOSIS — B379 Candidiasis, unspecified: Secondary | ICD-10-CM | POA: Diagnosis not present

## 2018-09-02 DIAGNOSIS — F0391 Unspecified dementia with behavioral disturbance: Secondary | ICD-10-CM | POA: Diagnosis not present

## 2018-09-02 LAB — COMPREHENSIVE METABOLIC PANEL
ALT: 14 U/L (ref 0–44)
AST: 24 U/L (ref 15–41)
Albumin: 3.7 g/dL (ref 3.5–5.0)
Alkaline Phosphatase: 63 U/L (ref 38–126)
Anion gap: 10 (ref 5–15)
BUN: 26 mg/dL — ABNORMAL HIGH (ref 8–23)
CO2: 29 mmol/L (ref 22–32)
Calcium: 9.3 mg/dL (ref 8.9–10.3)
Chloride: 100 mmol/L (ref 98–111)
Creatinine, Ser: 0.65 mg/dL (ref 0.44–1.00)
GFR calc Af Amer: >60 mL/min (ref >60)
GFR calc non Af Amer: >60 mL/min (ref >60)
Glucose, Bld: 114 mg/dL — ABNORMAL HIGH (ref 70–99)
Potassium: 4 mmol/L (ref 3.5–5.1)
Sodium: 139 mmol/L (ref 135–145)
Total Bilirubin: 0.9 mg/dL (ref 0.3–1.2)
Total Protein: 8.3 g/dL — ABNORMAL HIGH (ref 6.5–8.1)

## 2018-09-20 ENCOUNTER — Non-Acute Institutional Stay (SKILLED_NURSING_FACILITY): Payer: Medicare Other | Admitting: Adult Health

## 2018-09-20 ENCOUNTER — Encounter: Payer: Self-pay | Admitting: Adult Health

## 2018-09-20 DIAGNOSIS — F419 Anxiety disorder, unspecified: Secondary | ICD-10-CM | POA: Diagnosis not present

## 2018-09-20 DIAGNOSIS — H402234 Chronic angle-closure glaucoma, bilateral, indeterminate stage: Secondary | ICD-10-CM | POA: Diagnosis not present

## 2018-09-20 DIAGNOSIS — G20 Parkinson's disease: Secondary | ICD-10-CM | POA: Diagnosis not present

## 2018-09-20 DIAGNOSIS — G8929 Other chronic pain: Secondary | ICD-10-CM | POA: Diagnosis not present

## 2018-09-20 DIAGNOSIS — M545 Low back pain: Secondary | ICD-10-CM | POA: Diagnosis not present

## 2018-09-20 DIAGNOSIS — Z1159 Encounter for screening for other viral diseases: Secondary | ICD-10-CM | POA: Diagnosis not present

## 2018-09-20 NOTE — Progress Notes (Signed)
Location:   [place] Room Number: 105 D Place of Service:  SNF ([id number])   CODE STATUS: DNR  Allergies  Allergen Reactions  . Hydrochlorothiazide Other (See Comments)    Reaction:  Dizziness   . Lisinopril Cough  . Other Other (See Comments)    Pt states that she is allergic to multiple BP meds -- does not recall the names.   Reaction:  Dizziness   . Spironolactone Other (See Comments)    Reaction:  Blurred vision and sweating of feet     Chief Complaint  Patient presents with  . Medical Management of Chronic Issues    Chronic bilateral glaucoma; chronic anxiety; chronic bilateral back pain.     HPI:  She is a 85 year old long term resident of this facility being seen for the management of her chronic illnesses: glaucoma; back pain; anxiety. She does have periodic episodes of yelling out. There are no reports of uncontrolled pain; no reports of changes in appetite; no reports of insomnia. There are no reports of fevers present.   Past Medical History:  Diagnosis Date  . Arthritis    osteoporosis  . Back pain   . Chronic obstructive pulmonary disease (COPD) ([place])   . COPD (chronic obstructive pulmonary disease) ([place])   . Dementia ([place])   . Gait instability   . Glaucoma   . Glaucoma   . Hypertension   . Neck pain   . Parkinson's disease ([place])   . Tremor     Past Surgical History:  Procedure Laterality Date  . CATARACT EXTRACTION    . EYE SURGERY    . TONSILLECTOMY    . TUBAL LIGATION      Social History   Socioeconomic History  . Marital status: Married    Spouse name: Hubbard Robinson  . Number of children: 5  . Years of education: 3  . Highest education level: Not on file  Occupational History  . Not on file  Social Needs  . Financial resource strain: Not on file  . Food insecurity:    Worry: Not on file    Inability: Not on file  . Transportation needs:    Medical: Not on file    Non-medical: Not on file  Tobacco Use  .  Smoking status: Never Smoker  . Smokeless tobacco: Never Used  Substance and Sexual Activity  . Alcohol use: No  . Drug use: No  . Sexual activity: Not on file  Lifestyle  . Physical activity:    Days per week: Not on file    Minutes per session: Not on file  . Stress: Not on file  Relationships  . Social connections:    Talks on phone: Not on file    Gets together: Not on file    Attends religious service: Not on file    Active member of club or organization: Not on file    Attends meetings of clubs or organizations: Not on file    Relationship status: Not on file  . Intimate partner violence:    Fear of current or ex partner: Not on file    Emotionally abused: Not on file    Physically abused: Not on file    Forced sexual activity: Not on file  Other Topics Concern  . Not on file  Social History Narrative   Lives at home with husband, family   Caffeine use- tea 2 cups daily, decaf Mahamud Metts tea    Family History  Problem Relation Age of Onset  . Heart failure Mother       VITAL SIGNS BP (!) 143/85   Pulse 71   Temp (!) 97.2 F (36.2 C)   Resp 20   Ht 4\' 8"  (1.422 m)   Wt 87 lb 3.2 oz (39.6 kg)   BMI 19.55 kg/m   Outpatient Encounter Medications as of 09/20/2018  Medication Sig  . acetaminophen (TYLENOL) 325 MG tablet Take 650 mg by mouth every 6 (six) hours as needed.  Marland Kitchen. aspirin EC 81 MG tablet Take 81 mg by mouth daily.  . bisacodyl (DULCOLAX) 10 MG suppository Place 1 suppository (10 mg total) rectally as needed for moderate constipation.  . busPIRone (BUSPAR) 5 MG tablet Take 5 mg by mouth 3 (three) times daily.   . calcium carbonate (OS-CAL) 1250 (500 Ca) MG chewable tablet Chew 1 tablet by mouth daily.  . cholecalciferol (VITAMIN D3) 25 MCG (1000 UT) tablet Take 1,000 Units by mouth daily.  Marland Kitchen. denosumab (PROLIA) 60 MG/ML SOSY injection Inject 60 mg into the skin every 6 (six) months. Give once a day on the 10th of every 6th month  . docusate sodium (COLACE)  100 MG capsule Take 1 capsule (100 mg total) by mouth 2 (two) times daily.  Marland Kitchen. linaclotide (LINZESS) 145 MCG CAPS capsule Take 1 capsule (145 mcg total) by mouth daily before breakfast.  . Melatonin 3 MG TABS Take 3 mg by mouth at bedtime.  . Menthol (ICY HOT BACK) 5 % PTCH Apply 1 patch topically daily. To back for lower back pain and remove after 12 hours  . Multiple Vitamin (MULTIVITAMIN) tablet Take 1 tablet by mouth daily.  . nitroGLYCERIN (NITROSTAT) 0.4 MG SL tablet Place 1 tablet under the tongue as directed. Place 1 tablet under the tongue every five minutes as needed for emergency chest pain  . NON FORMULARY Diet Type:  Regular  . Nutritional Supplements (ENSURE ENLIVE PO) Take 1 Bottle by mouth 3 (three) times daily.  . pantoprazole (PROTONIX) 40 MG tablet Take 40 mg by mouth daily.  . polyethylene glycol (MIRALAX / GLYCOLAX) 17 g packet Take 17 g by mouth daily.  . rotigotine (NEUPRO) 4 MG/24HR Place 1 patch onto the skin daily.  . timolol (BETIMOL) 0.5 % ophthalmic solution Place 1 drop into both eyes 2 (two) times daily.  . [DISCONTINUED] polyethylene glycol (MIRALAX / GLYCOLAX) packet Take 17 g by mouth daily as needed. (Patient not taking: Reported on 09/20/2018)   No facility-administered encounter medications on file as of 09/20/2018.      SIGNIFICANT DIAGNOSTIC EXAMS  LABS REVIEWED PREVIOUS:   04-05-18: wbc 9.1; hgb 14.7; hc6 46.9; mcv 96.;3 plt 272  glucose 98; bun 24; creat 0.68 k+ 4.7; na++ 139 ca 9.5 liver normal albumin 3.9;   TODAY:   09-02-18: glucose 114; bun 26; creat 0.65; k+ 4.0; na++ 139; ca 8.3; liver normal albumin 3.7    Review of Systems  Unable to perform ROS: Dementia (unable to participate )   Physical Exam Constitutional:      General: She is not in acute distress.    Appearance: She is underweight. She is not diaphoretic.  Eyes:     Comments: History of cataract surgery   Neck:     Musculoskeletal: Neck supple.     Thyroid: No thyromegaly.   Cardiovascular:     Rate and Rhythm: Normal rate and regular rhythm.     Pulses: Normal pulses.     Heart  sounds: Normal heart sounds.  Pulmonary:     Effort: Pulmonary effort is normal. No respiratory distress.     Breath sounds: Normal breath sounds.  Abdominal:     General: Bowel sounds are normal. There is no distension.     Palpations: Abdomen is soft.     Tenderness: There is no abdominal tenderness.  Musculoskeletal:     Right lower leg: No edema.     Left lower leg: No edema.     Comments:  Is able to move all extremities Uses wheelchair Has stiffness present; has bilateral tremor present       Lymphadenopathy:     Cervical: No cervical adenopathy.  Skin:    General: Skin is warm and dry.  Neurological:     Mental Status: She is alert. Mental status is at baseline.  Psychiatric:        Mood and Affect: Mood normal.       ASSESSMENT/ PLAN:  TODAY:   1. Chronic low back pain without sciatica unspecified back pain laterality: is stable will continue menthol patch daily   2. Bilateral chronic primary angle closure glaucoma indeterminate age: is stable will continue timolol to both eyes twice daily  3. Chronic anxiety: is without change; has periods of yelling out: will continue buspar 5 mg three times daily    PREVIOUS   4. Chronic constipation: is stable will continue colace twice daily and linzess 145 mcg daily and has miralax prn   5. Age related osteoporosis without current pathological fracture: is stable will continue prolia 60 mg every 6 months   6. Parkinson's disease: is without change will continue neupro 4 mcg patch daily   7. Essential hypertension: is stable b/p 143/85 will continue asa 81 mg daily  8. Simple chronic bronchitis: is stable will continue to monitor her status.   9. GERD without esophagitis: is stable will continue protonix 40 mg daily    MD is aware of resident's narcotic use and is in agreement with current plan of care. We  will attempt to wean resident as apropriate   Synthia Innocenteborah Jaylen Knope NP [facility]  Contact [phone] Monday through Friday 8am- 5pm  After hours call [phone]

## 2018-09-22 ENCOUNTER — Non-Acute Institutional Stay (SKILLED_NURSING_FACILITY): Payer: Medicare Other | Admitting: Adult Health

## 2018-09-22 ENCOUNTER — Encounter: Payer: Self-pay | Admitting: Adult Health

## 2018-09-22 DIAGNOSIS — G20 Parkinson's disease: Secondary | ICD-10-CM

## 2018-09-22 DIAGNOSIS — F0281 Dementia in other diseases classified elsewhere with behavioral disturbance: Secondary | ICD-10-CM | POA: Diagnosis not present

## 2018-09-22 DIAGNOSIS — F02818 Dementia in other diseases classified elsewhere, unspecified severity, with other behavioral disturbance: Secondary | ICD-10-CM

## 2018-09-22 DIAGNOSIS — F419 Anxiety disorder, unspecified: Secondary | ICD-10-CM | POA: Diagnosis not present

## 2018-09-22 NOTE — Progress Notes (Signed)
Location:   [facility] Room Number: 105 D Place of Service:  SNF (31)   CODE STATUS: DNR  Allergies  Allergen Reactions  . Hydrochlorothiazide Other (See Comments)    Reaction:  Dizziness   . Lisinopril Cough  . Other Other (See Comments)    Pt states that she is allergic to multiple BP meds -- does not recall the names.   Reaction:  Dizziness   . Spironolactone Other (See Comments)    Reaction:  Blurred vision and sweating of feet     Chief Complaint  Patient presents with  . Acute Visit    Psychotropic Review    HPI:  She is presently taking buspar 5 mg three times daily for anxiety. She does have episodes of yelling out; which are less frequent. She is tolerating this medication easily. There are reports of pain present. There are no reports of insomnia present. She does get out of bed daily. There are no reports of fevers present.   Past Medical History:  Diagnosis Date  . Arthritis    osteoporosis  . Back pain   . Chronic obstructive pulmonary disease (COPD) ([facility])   . COPD (chronic obstructive pulmonary disease) ([facility])   . Dementia ([facility])   . Gait instability   . Glaucoma   . Glaucoma   . Hypertension   . Neck pain   . Parkinson's disease ([facility])   . Tremor     Past Surgical History:  Procedure Laterality Date  . CATARACT EXTRACTION    . EYE SURGERY    . TONSILLECTOMY    . TUBAL LIGATION      Social History   Socioeconomic History  . Marital status: Married    Spouse name: Normand Sloopheodore  . Number of children: 5  . Years of education: 3  . Highest education level: Not on file  Occupational History  . Not on file  Social Needs  . Financial resource strain: Not on file  . Food insecurity    Worry: Not on file    Inability: Not on file  . Transportation needs    Medical: Not on file    Non-medical: Not on file  Tobacco Use  . Smoking status: Never Smoker  . Smokeless tobacco: Never Used  Substance and Sexual Activity  .  Alcohol use: No  . Drug use: No  . Sexual activity: Not on file  Lifestyle  . Physical activity    Days per week: Not on file    Minutes per session: Not on file  . Stress: Not on file  Relationships  . Social Musicianconnections    Talks on phone: Not on file    Gets together: Not on file    Attends religious service: Not on file    Active member of club or organization: Not on file    Attends meetings of clubs or organizations: Not on file    Relationship status: Not on file  . Intimate partner violence    Fear of current or ex partner: Not on file    Emotionally abused: Not on file    Physically abused: Not on file    Forced sexual activity: Not on file  Other Topics Concern  . Not on file  Social History Narrative   Lives at home with husband, family   Caffeine use- tea 2 cups daily, decaf green tea    Family History  Problem Relation Age of Onset  . Heart failure Mother  VITAL SIGNS BP (!) 143/85   Pulse 71   Temp (!) 97.2 F (36.2 C)   Resp 20   Ht 4\' 8"  (1.422 m)   Wt 87 lb 3.2 oz (39.6 kg)   BMI 19.55 kg/m   Outpatient Encounter Medications as of 09/22/2018  Medication Sig  . acetaminophen (TYLENOL) 325 MG tablet Take 650 mg by mouth every 6 (six) hours as needed.  Marland Kitchen. aspirin EC 81 MG tablet Take 81 mg by mouth daily.  . bisacodyl (DULCOLAX) 10 MG suppository Place 1 suppository (10 mg total) rectally as needed for moderate constipation.  . busPIRone (BUSPAR) 5 MG tablet Take 5 mg by mouth 3 (three) times daily.   . calcium carbonate (OS-CAL) 1250 (500 Ca) MG chewable tablet Chew 1 tablet by mouth daily.  . cholecalciferol (VITAMIN D3) 25 MCG (1000 UT) tablet Take 1,000 Units by mouth daily.  Marland Kitchen. denosumab (PROLIA) 60 MG/ML SOSY injection Inject 60 mg into the skin every 6 (six) months. Give once a day on the 10th of every 6th month  . docusate sodium (COLACE) 100 MG capsule Take 1 capsule (100 mg total) by mouth 2 (two) times daily.  Marland Kitchen. linaclotide (LINZESS)  145 MCG CAPS capsule Take 1 capsule (145 mcg total) by mouth daily before breakfast.  . Melatonin 3 MG TABS Take 3 mg by mouth at bedtime.  . Menthol (ICY HOT BACK) 5 % PTCH Apply 1 patch topically daily. To back for lower back pain and remove after 12 hours  . Multiple Vitamin (MULTIVITAMIN) tablet Take 1 tablet by mouth daily.  . nitroGLYCERIN (NITROSTAT) 0.4 MG SL tablet Place 1 tablet under the tongue as directed. Place 1 tablet under the tongue every five minutes as needed for emergency chest pain  . NON FORMULARY Diet Type:  Regular  . Nutritional Supplements (ENSURE ENLIVE PO) Take 1 Bottle by mouth 3 (three) times daily.  . pantoprazole (PROTONIX) 40 MG tablet Take 40 mg by mouth daily.  . polyethylene glycol (MIRALAX / GLYCOLAX) 17 g packet Take 17 g by mouth daily.  . rotigotine (NEUPRO) 4 MG/24HR Place 1 patch onto the skin daily.  . timolol (BETIMOL) 0.5 % ophthalmic solution Place 1 drop into both eyes 2 (two) times daily.   No facility-administered encounter medications on file as of 09/22/2018.      SIGNIFICANT DIAGNOSTIC EXAMS   LABS REVIEWED PREVIOUS:   04-05-18: wbc 9.1; hgb 14.7; hc6 46.9; mcv 96.;3 plt 272  glucose 98; bun 24; creat 0.68 k+ 4.7; na++ 139 ca 9.5 liver normal albumin 3.9;  09-02-18: glucose 114; bun 26; creat 0.65; k+ 4.0; na++ 139; ca 8.3; liver normal albumin 3.7    NO NEW LABS.    Review of Systems  Unable to perform ROS: Dementia (unable to participate )    Physical Exam Constitutional:      General: She is not in acute distress.    Appearance: She is underweight. She is not diaphoretic.  Eyes:     Comments: History of cataract surgery    Neck:     Musculoskeletal: Neck supple.     Thyroid: No thyromegaly.  Cardiovascular:     Rate and Rhythm: Normal rate and regular rhythm.     Pulses: Normal pulses.     Heart sounds: Normal heart sounds.  Pulmonary:     Effort: Pulmonary effort is normal. No respiratory distress.     Breath sounds:  Normal breath sounds.  Abdominal:     General:  Bowel sounds are normal. There is no distension.     Palpations: Abdomen is soft.     Tenderness: There is no abdominal tenderness.  Musculoskeletal:     Right lower leg: No edema.     Left lower leg: No edema.     Comments: Is able to move all extremities Uses wheelchair Has stiffness present; has bilateral tremor present        Lymphadenopathy:     Cervical: No cervical adenopathy.  Skin:    General: Skin is warm and dry.  Neurological:     General: No focal deficit present.     Mental Status: She is alert.  Psychiatric:        Mood and Affect: Mood normal.    ASSESSMENT/ PLAN:  TODAY:   1. Chronic anxiety 2. Parkinson dementia with behavioral disturbance  Her anxiety is improving will increase her buspar to 7.5 mg three times daily and will monitor her status.   MD is aware of resident's narcotic use and is in agreement with current plan of care. We will attempt to wean resident as apropriate   Ok Edwards NP [facility]  Contact [phone] Monday through Friday 8am- 5pm  After hours call [phone]

## 2018-10-17 ENCOUNTER — Non-Acute Institutional Stay (SKILLED_NURSING_FACILITY): Payer: Medicare Other | Admitting: Adult Health

## 2018-10-17 ENCOUNTER — Encounter: Payer: Self-pay | Admitting: Adult Health

## 2018-10-17 DIAGNOSIS — F0281 Dementia in other diseases classified elsewhere with behavioral disturbance: Secondary | ICD-10-CM | POA: Diagnosis not present

## 2018-10-17 DIAGNOSIS — J410 Simple chronic bronchitis: Secondary | ICD-10-CM

## 2018-10-17 DIAGNOSIS — G20 Parkinson's disease: Secondary | ICD-10-CM

## 2018-10-17 NOTE — Progress Notes (Signed)
Location:   [facility] Room Number: 105 D Place of Service:  SNF (31)   CODE STATUS: DNR  Allergies  Allergen Reactions  . Hydrochlorothiazide Other (See Comments)    Reaction:  Dizziness   . Lisinopril Cough  . Other Other (See Comments)    Pt states that she is allergic to multiple BP meds -- does not recall the names.   Reaction:  Dizziness   . Spironolactone Other (See Comments)    Reaction:  Blurred vision and sweating of feet     Chief Complaint  Patient presents with  . Acute Visit    Family Concerns    HPI:  She is losing weight. Her weight on 05-19-18: 91 pounds; 08-29-18: 88 pounds; 10-17-18: 85 pounds. Her appetite is variable from 0 to 75%. I have spoken with her son regarding her decline in her status. He does verbalize understanding. There are no indications of pain. She will yell out at times; the buspar has helped with this issue.    Past Medical History:  Diagnosis Date  . Arthritis    osteoporosis  . Back pain   . Chronic obstructive pulmonary disease (COPD) ([facility])   . COPD (chronic obstructive pulmonary disease) ([facility])   . Dementia ([facility])   . Gait instability   . Glaucoma   . Glaucoma   . Hypertension   . Neck pain   . Parkinson's disease ([facility])   . Tremor     Past Surgical History:  Procedure Laterality Date  . CATARACT EXTRACTION    . EYE SURGERY    . TONSILLECTOMY    . TUBAL LIGATION      Social History   Socioeconomic History  . Marital status: Married    Spouse name: Normand Sloopheodore  . Number of children: 5  . Years of education: 3  . Highest education level: Not on file  Occupational History  . Not on file  Social Needs  . Financial resource strain: Not on file  . Food insecurity    Worry: Not on file    Inability: Not on file  . Transportation needs    Medical: Not on file    Non-medical: Not on file  Tobacco Use  . Smoking status: Never Smoker  . Smokeless tobacco: Never Used  Substance and Sexual  Activity  . Alcohol use: No  . Drug use: No  . Sexual activity: Not on file  Lifestyle  . Physical activity    Days per week: Not on file    Minutes per session: Not on file  . Stress: Not on file  Relationships  . Social Musicianconnections    Talks on phone: Not on file    Gets together: Not on file    Attends religious service: Not on file    Active member of club or organization: Not on file    Attends meetings of clubs or organizations: Not on file    Relationship status: Not on file  . Intimate partner violence    Fear of current or ex partner: Not on file    Emotionally abused: Not on file    Physically abused: Not on file    Forced sexual activity: Not on file  Other Topics Concern  . Not on file  Social History Narrative   Lives at home with husband, family   Caffeine use- tea 2 cups daily, decaf green tea    Family History  Problem Relation Age of Onset  .  Heart failure Mother       VITAL SIGNS BP (!) 112/57   Pulse (!) 52   Temp 98.6 F (37 C)   Resp 18   Ht 4\' 8"  (1.422 m)   Wt 85 lb 12.8 oz (38.9 kg)   BMI 19.24 kg/m   Outpatient Encounter Medications as of 10/17/2018  Medication Sig  . acetaminophen (TYLENOL) 325 MG tablet Take 650 mg by mouth every 6 (six) hours as needed.  Marland Kitchen aspirin EC 81 MG tablet Take 81 mg by mouth daily.  . bisacodyl (DULCOLAX) 10 MG suppository Place 1 suppository (10 mg total) rectally as needed for moderate constipation.  . busPIRone (BUSPAR) 5 MG tablet Take 5 mg by mouth 3 (three) times daily.   . calcium carbonate (OS-CAL) 1250 (500 Ca) MG chewable tablet Chew 1 tablet by mouth daily.  . cholecalciferol (VITAMIN D3) 25 MCG (1000 UT) tablet Take 1,000 Units by mouth daily.  Marland Kitchen denosumab (PROLIA) 60 MG/ML SOSY injection Inject 60 mg into the skin every 6 (six) months. Give once a day on the 10th of every 6th month  . docusate sodium (COLACE) 100 MG capsule Take 1 capsule (100 mg total) by mouth 2 (two) times daily.  Marland Kitchen linaclotide  (LINZESS) 145 MCG CAPS capsule Take 1 capsule (145 mcg total) by mouth daily before breakfast.  . Melatonin 3 MG TABS Take 3 mg by mouth at bedtime.  . Menthol (ICY HOT BACK) 5 % PTCH Apply 1 patch topically daily. To back for lower back pain and remove after 12 hours  . Multiple Vitamin (MULTIVITAMIN) tablet Take 1 tablet by mouth daily.  . nitroGLYCERIN (NITROSTAT) 0.4 MG SL tablet Place 1 tablet under the tongue as directed. Place 1 tablet under the tongue every five minutes as needed for emergency chest pain  . NON FORMULARY Diet Type:  Regular  . Nutritional Supplements (ENSURE ENLIVE PO) Take 1 Bottle by mouth 3 (three) times daily.  . pantoprazole (PROTONIX) 40 MG tablet Take 40 mg by mouth daily.  . polyethylene glycol (MIRALAX / GLYCOLAX) 17 g packet Take 17 g by mouth daily.  . rotigotine (NEUPRO) 4 MG/24HR Place 1 patch onto the skin daily.  . timolol (BETIMOL) 0.5 % ophthalmic solution Place 1 drop into both eyes 2 (two) times daily.   No facility-administered encounter medications on file as of 10/17/2018.      SIGNIFICANT DIAGNOSTIC EXAMS  LABS REVIEWED PREVIOUS:   04-05-18: wbc 9.1; hgb 14.7; hc6 46.9; mcv 96.;3 plt 272  glucose 98; bun 24; creat 0.68 k+ 4.7; na++ 139 ca 9.5 liver normal albumin 3.9;  09-02-18: glucose 114; bun 26; creat 0.65; k+ 4.0; na++ 139; ca 8.3; liver normal albumin 3.7    NO NEW LABS.    Review of Systems  Unable to perform ROS: Dementia (unable to participate )     Physical Exam Constitutional:      General: She is not in acute distress.    Appearance: She is underweight. She is not diaphoretic.  Eyes:     Comments: History of cataract surgery     Neck:     Musculoskeletal: Neck supple.     Thyroid: No thyromegaly.  Cardiovascular:     Rate and Rhythm: Normal rate and regular rhythm.     Pulses: Normal pulses.     Heart sounds: Normal heart sounds.  Pulmonary:     Effort: Pulmonary effort is normal. No respiratory distress.      Breath sounds:  Normal breath sounds.  Abdominal:     General: Bowel sounds are normal. There is no distension.     Palpations: Abdomen is soft.     Tenderness: There is no abdominal tenderness.  Musculoskeletal:     Right lower leg: No edema.     Left lower leg: No edema.     Comments: Is able to move all extremities Uses wheelchair Has stiffness present; has bilateral tremor present     Lymphadenopathy:     Cervical: No cervical adenopathy.  Skin:    General: Skin is warm and dry.  Neurological:     Mental Status: She is alert. Mental status is at baseline.  Psychiatric:        Mood and Affect: Mood normal.     ASSESSMENT/ PLAN:  TODAY:   1. Dementia due to parkinson's disease with behavioral disturbance 2. Parkinson's disease 3. Simple chronic bronchitis  Her status is declining. I have reviewed her medications with her son: will stop prolia and calcium at this time and will monitor her status   MD is aware of resident's narcotic use and is in agreement with current plan of care. We will attempt to wean resident as apropriate   Synthia Innocenteborah Green NP [facility]  Contact [phone] Monday through Friday 8am- 5pm  After hours call [phone]

## 2018-10-21 ENCOUNTER — Non-Acute Institutional Stay (SKILLED_NURSING_FACILITY): Payer: Medicare Other | Admitting: Adult Health

## 2018-10-21 ENCOUNTER — Encounter: Payer: Self-pay | Admitting: Adult Health

## 2018-10-21 DIAGNOSIS — M810 Age-related osteoporosis without current pathological fracture: Secondary | ICD-10-CM | POA: Diagnosis not present

## 2018-10-21 DIAGNOSIS — K5909 Other constipation: Secondary | ICD-10-CM | POA: Diagnosis not present

## 2018-10-21 DIAGNOSIS — G20 Parkinson's disease: Secondary | ICD-10-CM

## 2018-10-21 NOTE — Progress Notes (Signed)
Location:   [facility] Room Number: 105 D Place of Service:  SNF (31)   CODE STATUS: DNR  Allergies  Allergen Reactions  . Hydrochlorothiazide Other (See Comments)    Reaction:  Dizziness   . Lisinopril Cough  . Other Other (See Comments)    Pt states that she is allergic to multiple BP meds -- does not recall the names.   Reaction:  Dizziness   . Spironolactone Other (See Comments)    Reaction:  Blurred vision and sweating of feet     Chief Complaint  Patient presents with  . Medical Management of Chronic Issues       Chronic constipation:  Age related osteoporosis without current pathological fracture Parkinson's disease:    HPI:  She is a 85 year old long term resident of this facility being seen for the management of her chronic illnesses: constipation; osteoporosis; parkinson's disease. There are no reports of agitation; no reports of uncontrolled pain; no reports of constipation.   Past Medical History:  Diagnosis Date  . Arthritis    osteoporosis  . Back pain   . Chronic obstructive pulmonary disease (COPD) (HCC)   . COPD (chronic obstructive pulmonary disease) (HCC)   . Dementia (HCC)   . Gait instability   . Glaucoma   . Glaucoma   . Hypertension   . Neck pain   . Parkinson's disease (HCC)   . Tremor     Past Surgical History:  Procedure Laterality Date  . CATARACT EXTRACTION    . EYE SURGERY    . TONSILLECTOMY    . TUBAL LIGATION      Social History   Socioeconomic History  . Marital status: Married    Spouse name: Normand Sloopheodore  . Number of children: 5  . Years of education: 3  . Highest education level: Not on file  Occupational History  . Not on file  Social Needs  . Financial resource strain: Not on file  . Food insecurity    Worry: Not on file    Inability: Not on file  . Transportation needs    Medical: Not on file    Non-medical: Not on file  Tobacco Use  . Smoking status: Never Smoker  . Smokeless  tobacco: Never Used  Substance and Sexual Activity  . Alcohol use: No  . Drug use: No  . Sexual activity: Not on file  Lifestyle  . Physical activity    Days per week: Not on file    Minutes per session: Not on file  . Stress: Not on file  Relationships  . Social Musicianconnections    Talks on phone: Not on file    Gets together: Not on file    Attends religious service: Not on file    Active member of club or organization: Not on file    Attends meetings of clubs or organizations: Not on file    Relationship status: Not on file  . Intimate partner violence    Fear of current or ex partner: Not on file    Emotionally abused: Not on file    Physically abused: Not on file    Forced sexual activity: Not on file  Other Topics Concern  . Not on file  Social History Narrative   Lives at home with husband, family   Caffeine use- tea 2 cups daily, decaf Baron Parmelee tea    Family History  Problem Relation Age of Onset  . Heart failure Mother  VITAL SIGNS BP (!) 112/57   Pulse (!) 52   Temp 98.6 F (37 C)   Resp 18   Ht 4\' 8"  (1.422 m)   Wt 84 lb 6.4 oz (38.3 kg)   BMI 18.92 kg/m   Outpatient Encounter Medications as of 10/21/2018  Medication Sig  . acetaminophen (TYLENOL) 325 MG tablet Take 650 mg by mouth every 6 (six) hours as needed.  Marland Kitchen. aspirin EC 81 MG tablet Take 81 mg by mouth daily.  . bisacodyl (DULCOLAX) 10 MG suppository Place 1 suppository (10 mg total) rectally as needed for moderate constipation.  . busPIRone (BUSPAR) 7.5 MG tablet Take 7.5 mg by mouth 3 (three) times daily.   . cholecalciferol (VITAMIN D3) 25 MCG (1000 UT) tablet Take 1,000 Units by mouth daily.  Marland Kitchen. docusate sodium (COLACE) 100 MG capsule Take 1 capsule (100 mg total) by mouth 2 (two) times daily.  Marland Kitchen. linaclotide (LINZESS) 145 MCG CAPS capsule Take 1 capsule (145 mcg total) by mouth daily before breakfast.  . Melatonin 3 MG TABS Take 3 mg by mouth at bedtime.  . Menthol (ICY HOT BACK) 5 % PTCH Apply  1 patch topically daily. To back for lower back pain and remove after 12 hours  . Multiple Vitamin (MULTIVITAMIN) tablet Take 1 tablet by mouth daily.  . nitroGLYCERIN (NITROSTAT) 0.4 MG SL tablet Place 1 tablet under the tongue as directed. Place 1 tablet under the tongue every five minutes as needed for emergency chest pain  . NON FORMULARY Diet Type:  Regular  . Nutritional Supplements (ENSURE ENLIVE PO) Take 1 Bottle by mouth 3 (three) times daily.  . pantoprazole (PROTONIX) 40 MG tablet Take 40 mg by mouth daily.  . polyethylene glycol (MIRALAX / GLYCOLAX) 17 g packet Take 17 g by mouth daily.  . rotigotine (NEUPRO) 4 MG/24HR Place 1 patch onto the skin daily.  . timolol (BETIMOL) 0.5 % ophthalmic solution Place 1 drop into both eyes 2 (two) times daily.  . calcium carbonate (OS-CAL) 1250 (500 Ca) MG chewable tablet Chew 1 tablet by mouth daily.  . [DISCONTINUED] denosumab (PROLIA) 60 MG/ML SOSY injection Inject 60 mg into the skin every 6 (six) months. Give once a day on the 10th of every 6th month   No facility-administered encounter medications on file as of 10/21/2018.      SIGNIFICANT DIAGNOSTIC EXAMS  LABS REVIEWED PREVIOUS:   04-05-18: wbc 9.1; hgb 14.7; hc6 46.9; mcv 96.;3 plt 272  glucose 98; bun 24; creat 0.68 k+ 4.7; na++ 139 ca 9.5 liver normal albumin 3.9;  09-02-18: glucose 114; bun 26; creat 0.65; k+ 4.0; na++ 139; ca 8.3; liver normal albumin 3.7    NO NEW LABS.    Review of Systems  Unable to perform ROS: Dementia (unable to participate )    Physical Exam Constitutional:      General: She is not in acute distress.    Appearance: She is cachectic. She is not diaphoretic.  Eyes:     Comments: History of cataract surgery  Neck:     Thyroid: No thyromegaly.  Cardiovascular:     Rate and Rhythm: Normal rate and regular rhythm.     Heart sounds: Normal heart sounds.  Pulmonary:     Effort: Pulmonary effort is normal. No respiratory distress.     Breath sounds:  Normal breath sounds.  Abdominal:     General: Bowel sounds are normal. There is no distension.     Palpations: Abdomen is soft.  Tenderness: There is no abdominal tenderness.  Musculoskeletal:     Right lower leg: No edema.     Left lower leg: No edema.     Comments: Is able to move all extremities Uses wheelchair Has stiffness present; has bilateral tremor present      Lymphadenopathy:     Cervical: No cervical adenopathy.  Skin:    General: Skin is warm and dry.  Neurological:     Mental Status: She is alert. Mental status is at baseline.  Psychiatric:        Mood and Affect: Mood normal.        ASSESSMENT/ PLAN:  TODAY:   1. Chronic constipation: is stable will continue colace twice daily; linzess 145 mcg daily and miralax daily as needed  2. Age related osteoporosis without current pathological fracture: is without change; will continue to monitor her status. Is off prolia  3. Parkinson's disease: is without change; will continue neupro 5 mcg patch daily   PREVIOUS   4. Essential hypertension: is stable b/p 112/57 will continue asa 81 mg daily  5. Simple chronic bronchitis: is stable will continue to monitor her status.   6. GERD without esophagitis: is stable will continue protonix 40 mg daily   7. Chronic low back pain without sciatica unspecified back pain laterality: is stable will continue menthol patch daily   8. Bilateral chronic primary angle closure glaucoma indeterminate age: is stable will continue timolol to both eyes twice daily  9. Chronic anxiety: is stable has rare occasions of yelling out: will continue buspar 7.5 mg three times daily   10; dementia due to parkinson's disease with behavioral disturbance: is without change weight is 84 pounds will monitor    MD is aware of resident's narcotic use and is in agreement with current plan of care. We will attempt to wean resident as apropriate   Ok Edwards NP [facility]   Contact [phone] Monday through Friday 8am- 5pm  After hours call [phone]

## 2018-11-10 ENCOUNTER — Other Ambulatory Visit: Payer: Self-pay

## 2018-11-15 DIAGNOSIS — Z20828 Contact with and (suspected) exposure to other viral communicable diseases: Secondary | ICD-10-CM | POA: Diagnosis not present

## 2018-11-16 ENCOUNTER — Encounter: Payer: Self-pay | Admitting: Internal Medicine

## 2018-11-16 ENCOUNTER — Non-Acute Institutional Stay (SKILLED_NURSING_FACILITY): Payer: Medicare Other | Admitting: Internal Medicine

## 2018-11-16 DIAGNOSIS — F0281 Dementia in other diseases classified elsewhere with behavioral disturbance: Secondary | ICD-10-CM

## 2018-11-16 DIAGNOSIS — G20 Parkinson's disease: Secondary | ICD-10-CM

## 2018-11-16 DIAGNOSIS — K219 Gastro-esophageal reflux disease without esophagitis: Secondary | ICD-10-CM

## 2018-11-16 NOTE — Progress Notes (Signed)
Location:  [facility] Room Number: 105/D Place of Service:  SNF (31)  Margit HanksAlexander, Cuyler Vandyken D, MD  Patient Care Team: Margit HanksAlexander, Traver Meckes D, MD as PCP - General (Internal Medicine) [facility] Chilton SiGreen, Chong Sicilianeborah S, NP as Nurse Practitioner (Geriatric Medicine)  Extended Emergency Contact Information Primary Emergency Contact: Fedorchak,[name] Address: [address]          [place], [place] [address] [place] Home Phone: [phone] Relation: Spouse Secondary Emergency Contact: Mathenia,[name] Address: [address]          [place], [place] [address] [place] Home Phone: [phone] Mobile Phone: [phone] Relation: Son    Allergies: Hydrochlorothiazide, Lisinopril, Other, and Spironolactone  Chief Complaint  Patient presents with  . Medical Management of Chronic Issues    Routine visit of medical management  . Immunizations    Flu Shot    HPI: Patient is 85 y.o. female who  is being seen for routine issues of GERD, Parkinson's disease, and dementia.  Past Medical History:  Diagnosis Date  . Arthritis    osteoporosis  . Back pain   . Chronic obstructive pulmonary disease (COPD) (HCC)   . COPD (chronic obstructive pulmonary disease) (HCC)   . Dementia (HCC)   . Gait instability   . Glaucoma   . Glaucoma   . Hypertension   . Neck pain   . Parkinson's disease (HCC)   . Tremor     Past Surgical History:  Procedure Laterality Date  . CATARACT EXTRACTION    . EYE SURGERY    . TONSILLECTOMY    . TUBAL LIGATION      Outpatient Encounter Medications as of 11/16/2018  Medication Sig  . acetaminophen (TYLENOL) 325 MG tablet Take 650 mg by mouth every 6 (six) hours as needed.  Marland Kitchen. aspirin EC 81 MG tablet Take 81 mg by mouth daily.  . bisacodyl (DULCOLAX) 10 MG suppository Place 1 suppository (10 mg total) rectally as needed for moderate constipation.  . busPIRone (BUSPAR) 7.5 MG tablet Take  7.5 mg by mouth 3 (three) times daily.   . cholecalciferol (VITAMIN D3) 25 MCG (1000 UT) tablet Take 1,000 Units by mouth daily.  Marland Kitchen. docusate sodium (COLACE) 100 MG capsule Take 1 capsule (100 mg total) by mouth 2 (two) times daily.  Marland Kitchen. linaclotide (LINZESS) 145 MCG CAPS capsule Take 1 capsule (145 mcg total) by mouth daily before breakfast.  . Melatonin 3 MG TABS Take 3 mg by mouth at bedtime.  . Menthol (ICY HOT BACK) 5 % PTCH Apply 1 patch topically daily. To back for lower back pain and remove after 12 hours  . Multiple Vitamin (MULTIVITAMIN) tablet Take 1 tablet by mouth daily.  . nitroGLYCERIN (NITROSTAT) 0.4 MG SL tablet Place 1 tablet under the tongue as directed. Place 1 tablet under the tongue every five minutes as needed for emergency chest pain  . NON FORMULARY Diet Type:  Regular  . Nutritional Supplements (ENSURE ENLIVE PO) Take 1 Bottle by mouth 3 (three) times daily.  . pantoprazole (PROTONIX) 40 MG tablet Take 40 mg by mouth daily.  . polyethylene glycol (MIRALAX / GLYCOLAX) 17 g packet Take 17 g by mouth daily.  . rotigotine (NEUPRO) 4 MG/24HR Place 1 patch onto the skin daily.  . timolol (BETIMOL) 0.5 % ophthalmic solution Place 1 drop into both eyes 2 (two) times daily.  . [DISCONTINUED] calcium carbonate (OS-CAL) 1250 (500 Ca) MG chewable tablet Chew 1 tablet  by mouth daily.   No facility-administered encounter medications on file as of 11/16/2018.      No orders of the defined types were placed in this encounter.   Immunization History  Administered Date(s) Administered  . Influenza, High Dose Seasonal PF 01/29/2016  . Influenza-Unspecified 01/29/2011, 01/27/2012, 02/02/2013, 01/16/2014, 01/13/2018  . Pneumococcal Conjugate-13 02/20/2014, 01/19/2018  . Pneumococcal Polysaccharide-23 05/05/2012  . Pneumococcal-Unspecified 01/19/2018  . Tdap 02/20/2014, 01/19/2018    Social History   Tobacco Use  . Smoking status: Never Smoker  . Smokeless tobacco: Never Used   Substance Use Topics  . Alcohol use: No    Review of Systems  DATA OBTAINED: from nurse GENERAL:  no fevers, fatigue, appetite changes SKIN: No itching, rash HEENT: No complaint RESPIRATORY: No cough, wheezing, SOB CARDIAC: No chest pain, palpitations, lower extremity edema  GI: No abdominal pain, No N/V/D or constipation, No whenever they lived in heartburn or reflux  GU: No dysuria, frequency or urgency, or incontinence  MUSCULOSKELETAL: No unrelieved bone/joint pain NEUROLOGIC: No headache, dizziness  PSYCHIATRIC: No overt anxiety or sadness  Vitals:   11/16/18 1119  BP: (!) 128/58  Pulse: 60  Resp: 18  Temp: 97.6 F (36.4 C)  SpO2: 98%   Body mass index is 19.24 kg/m. Physical Exam  GENERAL APPEARANCE: Alert, non-conversant, No acute distress  SKIN: No diaphoresis rash HEENT: Unremarkable RESPIRATORY: Breathing is even, unlabored. Lung sounds are clear   CARDIOVASCULAR: Heart RRR no murmurs, rubs or gallops. No peripheral edema  GASTROINTESTINAL: Abdomen is soft, non-tender, not distended w/ normal bowel sounds.  GENITOURINARY: Bladder non tender, not distended  MUSCULOSKELETAL: No abnormal joints or musculature NEUROLOGIC: Cranial nerves 2-12 grossly intact. Moves all extremities PSYCHIATRIC: Mood and affect with dementia, no behavioral issues  Patient Active Problem List   Diagnosis Date Noted  . GERD without esophagitis 06/16/2018  . Age-related osteoporosis without current pathological fracture 06/16/2018  . Chronic low back pain without sciatica 06/16/2018  . Bilateral chronic primary angle-closure glaucoma, indeterminate stage 06/16/2018  . Chronic anxiety 06/16/2018  . Rectal bleeding 12/27/2017  . Anxiety 11/25/2017  . Palliative care by specialist   . Goals of care, counseling/discussion   . Chronic constipation   . UTI (urinary tract infection) 08/20/2017  . Proctitis 08/20/2017  . Recurrent falls 04/26/2017  . Dementia (HCC) 04/26/2017  . COPD  (chronic obstructive pulmonary disease) (HCC) 04/22/2017  . HTN (hypertension) 04/22/2017  . Parkinson's disease (HCC) 04/22/2017  . Glaucoma 04/22/2017  . Osteoporosis 04/22/2017  . Bilateral fracture of pubic rami (HCC) 04/22/2017  . Traumatic closed nondisp fracture of greater tuberosity of humerus, left, initial encounter 12/08/2016  . Dizziness 10/13/2016    CMP     Component Value Date/Time   NA 139 09/02/2018 0615   K 4.0 09/02/2018 0615   CL 100 09/02/2018 0615   CO2 29 09/02/2018 0615   GLUCOSE 114 (H) 09/02/2018 0615   BUN 26 (H) 09/02/2018 0615   CREATININE 0.65 09/02/2018 0615   CALCIUM 9.3 09/02/2018 0615   PROT 8.3 (H) 09/02/2018 0615   ALBUMIN 3.7 09/02/2018 0615   AST 24 09/02/2018 0615   ALT 14 09/02/2018 0615   ALKPHOS 63 09/02/2018 0615   BILITOT 0.9 09/02/2018 0615   GFRNONAA >60 09/02/2018 0615   GFRAA >60 09/02/2018 0615   Recent Labs    02/18/2018 1401 04/05/18 0700 09/02/18 0615  NA 138 139 139  K 4.1 4.7 4.0  CL 109 104 100  CO2 24 27 29  GLUCOSE 89 98 114*  BUN 21 24* 26*  CREATININE 0.78 0.68 0.65  CALCIUM 8.6* 9.5 9.3   Recent Labs    02/20/2018 1401 04/05/18 0700 09/02/18 0615  AST 23 26 24   ALT 15 17 14   ALKPHOS 56 65 63  BILITOT 1.2 1.1 0.9  PROT 7.4 8.4* 8.3*  ALBUMIN 3.4* 3.9 3.7   Recent Labs    12/28/17 0847 02/18/2018 1401 04/05/18 0700  WBC 8.8 7.8 9.1  NEUTROABS 4.5 4.1 5.5  HGB 13.0 12.9 14.7  HCT 39.8 41.0 46.9*  MCV 96.4 95.8 96.3  PLT 255 247 272   No results for input(s): CHOL, LDLCALC, TRIG in the last 8760 hours.  Invalid input(s): HCL No results found for: MICROALBUR Lab Results  Component Value Date   TSH 1.419 06/16/2017   No results found for: HGBA1C No results found for: CHOL, HDL, LDLCALC, LDLDIRECT, TRIG, CHOLHDL  Significant Diagnostic Results in last 30 days:  No results found.  Assessment and Plan  GERD without esophagitis No reports of problems; continue Protonix 40 mg daily   Parkinson's disease ([place]) Chronic and stable on Neupro 4 mg / 24-hour patch apply 1 to skin daily; continue current therapy  Dementia Patient reported to be up all night screaming afraid, fearful that there were some in her window; will start Seroquel 25 mg nightly     Hennie Duos, MD

## 2018-11-20 ENCOUNTER — Encounter: Payer: Self-pay | Admitting: Internal Medicine

## 2018-11-20 NOTE — Assessment & Plan Note (Signed)
No reports of problems; continue Protonix 40 mg daily 

## 2018-11-20 NOTE — Assessment & Plan Note (Signed)
Chronic and stable on Neupro 4 mg / 24-hour patch apply 1 to skin daily; continue current therapy

## 2018-11-20 NOTE — Assessment & Plan Note (Signed)
Patient reported to be up all night screaming afraid, fearful that there were some in her window; will start Seroquel 25 mg nightly

## 2018-11-23 ENCOUNTER — Encounter: Payer: Self-pay | Admitting: Adult Health

## 2018-11-23 ENCOUNTER — Non-Acute Institutional Stay (SKILLED_NURSING_FACILITY): Payer: Medicare Other | Admitting: Adult Health

## 2018-11-23 DIAGNOSIS — J410 Simple chronic bronchitis: Secondary | ICD-10-CM

## 2018-11-23 DIAGNOSIS — F0281 Dementia in other diseases classified elsewhere with behavioral disturbance: Secondary | ICD-10-CM | POA: Diagnosis not present

## 2018-11-23 DIAGNOSIS — G20 Parkinson's disease: Secondary | ICD-10-CM

## 2018-11-23 NOTE — Progress Notes (Signed)
Location:   [facility] Room Number: 105 D Place of Service:  SNF (31)   CODE STATUS: DNR  Allergies  Allergen Reactions  . Hydrochlorothiazide Other (See Comments)    Reaction:  Dizziness   . Lisinopril Cough  . Other Other (See Comments)    Pt states that she is allergic to multiple BP meds -- does not recall the names.   Reaction:  Dizziness   . Spironolactone Other (See Comments)    Reaction:  Blurred vision and sweating of feet     Chief Complaint  Patient presents with  . Acute Visit    Care Plan Meeting    HPI:  We have come together for her routine care plan meeting. Unable to perform BIMS or mood. Current weight is 85.8 pounds. Her appetite is poor. Activities reports that she is angry with her family when they face time each other. She has been yelling out frequently. She was recently started on seroquel nightly. Staff report that her yelling out is getting better. There are no reports of recent falls.    Past Medical History:  Diagnosis Date  . Arthritis    osteoporosis  . Back pain   . Chronic obstructive pulmonary disease (COPD) (HCC)   . COPD (chronic obstructive pulmonary disease) (HCC)   . Dementia (HCC)   . Gait instability   . Glaucoma   . Glaucoma   . Hypertension   . Neck pain   . Parkinson's disease (HCC)   . Tremor     Past Surgical History:  Procedure Laterality Date  . CATARACT EXTRACTION    . EYE SURGERY    . TONSILLECTOMY    . TUBAL LIGATION      Social History   Socioeconomic History  . Marital status: Married    Spouse name: Sandra Davis  . Number of children: 5  . Years of education: 3  . Highest education level: Not on file  Occupational History  . Not on file  Social Needs  . Financial resource strain: Not on file  . Food insecurity    Worry: Not on file    Inability: Not on file  . Transportation needs    Medical: Not on file    Non-medical: Not on file  Tobacco Use  . Smoking status:  Never Smoker  . Smokeless tobacco: Never Used  Substance and Sexual Activity  . Alcohol use: No  . Drug use: No  . Sexual activity: Not on file  Lifestyle  . Physical activity    Days per week: Not on file    Minutes per session: Not on file  . Stress: Not on file  Relationships  . Social Musicianconnections    Talks on phone: Not on file    Gets together: Not on file    Attends religious service: Not on file    Active member of club or organization: Not on file    Attends meetings of clubs or organizations: Not on file    Relationship status: Not on file  . Intimate partner violence    Fear of current or ex partner: Not on file    Emotionally abused: Not on file    Physically abused: Not on file    Forced sexual activity: Not on file  Other Topics Concern  . Not on file  Social History Narrative   Lives at home with husband, family   Caffeine use- tea 2 cups daily, decaf Keighan Amezcua tea  Family History  Problem Relation Age of Onset  . Heart failure Mother       VITAL SIGNS BP (!) 150/74   Pulse 87   Temp 97.6 F (36.4 C)   Resp 18   Ht 4\' 8"  (1.422 m)   Wt 85 lb 12.8 oz (38.9 kg)   BMI 19.24 kg/m   Outpatient Encounter Medications as of 11/23/2018  Medication Sig  . acetaminophen (TYLENOL) 325 MG tablet Take 650 mg by mouth every 6 (six) hours as needed.  Marland Kitchen aspirin EC 81 MG tablet Take 81 mg by mouth daily.  . bisacodyl (DULCOLAX) 10 MG suppository Place 1 suppository (10 mg total) rectally as needed for moderate constipation.  . busPIRone (BUSPAR) 7.5 MG tablet Take 7.5 mg by mouth 3 (three) times daily.   . cholecalciferol (VITAMIN D3) 25 MCG (1000 UT) tablet Take 1,000 Units by mouth daily.  Marland Kitchen docusate sodium (COLACE) 100 MG capsule Take 1 capsule (100 mg total) by mouth 2 (two) times daily.  Marland Kitchen linaclotide (LINZESS) 145 MCG CAPS capsule Take 1 capsule (145 mcg total) by mouth daily before breakfast.  . Melatonin 3 MG TABS Take 3 mg by mouth at bedtime.  . Menthol  (ICY HOT BACK) 5 % PTCH Apply 1 patch topically daily. To back for lower back pain and remove after 12 hours  . Multiple Vitamin (MULTIVITAMIN) tablet Take 1 tablet by mouth daily.  . nitroGLYCERIN (NITROSTAT) 0.4 MG SL tablet Place 1 tablet under the tongue as directed. Place 1 tablet under the tongue every five minutes as needed for emergency chest pain  . NON FORMULARY Diet Type:  Regular  . Nutritional Supplements (ENSURE ENLIVE PO) Take 1 Bottle by mouth 3 (three) times daily.  . pantoprazole (PROTONIX) 40 MG tablet Take 40 mg by mouth daily.  . polyethylene glycol (MIRALAX / GLYCOLAX) 17 g packet Take 17 g by mouth daily.  . QUEtiapine (SEROQUEL) 25 MG tablet Take 25 mg by mouth at bedtime.  . rotigotine (NEUPRO) 4 MG/24HR Place 1 patch onto the skin daily.  . timolol (BETIMOL) 0.5 % ophthalmic solution Place 1 drop into both eyes 2 (two) times daily.   No facility-administered encounter medications on file as of 11/23/2018.      SIGNIFICANT DIAGNOSTIC EXAMS  LABS REVIEWED PREVIOUS:   04-05-18: wbc 9.1; hgb 14.7; hc6 46.9; mcv 96.;3 plt 272  glucose 98; bun 24; creat 0.68 k+ 4.7; na++ 139 ca 9.5 liver normal albumin 3.9;  09-02-18: glucose 114; bun 26; creat 0.65; k+ 4.0; na++ 139; ca 8.3; liver normal albumin 3.7    NO NEW LABS.    Review of Systems  Unable to perform ROS: Dementia (unable to participate )    Physical Exam Constitutional:      General: She is not in acute distress.    Appearance: She is cachectic. She is not diaphoretic.  Eyes:     Comments: History of cataract surgery   Neck:     Musculoskeletal: Neck supple.     Thyroid: No thyromegaly.  Cardiovascular:     Rate and Rhythm: Normal rate and regular rhythm.     Pulses: Normal pulses.     Heart sounds: Normal heart sounds.  Pulmonary:     Effort: Pulmonary effort is normal. No respiratory distress.     Breath sounds: Normal breath sounds.  Abdominal:     General: Bowel sounds are normal. There is no  distension.     Palpations: Abdomen is soft.  Tenderness: There is no abdominal tenderness.  Musculoskeletal:     Right lower leg: No edema.     Left lower leg: No edema.     Comments: Is able to move all extremities Uses wheelchair Has stiffness present; has bilateral tremor present       Lymphadenopathy:     Cervical: No cervical adenopathy.  Skin:    General: Skin is warm and dry.  Neurological:     Mental Status: She is alert. Mental status is at baseline.  Psychiatric:        Mood and Affect: Mood normal.      ASSESSMENT/ PLAN:  TODAY:   1. Dementia due to parkinson's disease with behavioral disturbance 2. Parkinson's disease 3. Simple chronic bronchitis.   Will continue current medications Will continue current plan of care Will monitor her status.       MD is aware of resident's narcotic use and is in agreement with current plan of care. We will attempt to wean resident as apropriate   Synthia Innocenteborah Romone Shaff NP [facility]  Contact [phone] Monday through Friday 8am- 5pm  After hours call [phone]

## 2018-12-02 DIAGNOSIS — G20 Parkinson's disease: Secondary | ICD-10-CM | POA: Diagnosis not present

## 2018-12-02 DIAGNOSIS — F411 Generalized anxiety disorder: Secondary | ICD-10-CM | POA: Diagnosis not present

## 2018-12-15 ENCOUNTER — Non-Acute Institutional Stay (SKILLED_NURSING_FACILITY): Payer: Medicare Other | Admitting: Adult Health

## 2018-12-15 ENCOUNTER — Encounter: Payer: Self-pay | Admitting: Adult Health

## 2018-12-15 DIAGNOSIS — K219 Gastro-esophageal reflux disease without esophagitis: Secondary | ICD-10-CM | POA: Diagnosis not present

## 2018-12-15 DIAGNOSIS — F419 Anxiety disorder, unspecified: Secondary | ICD-10-CM | POA: Diagnosis not present

## 2018-12-15 DIAGNOSIS — F0391 Unspecified dementia with behavioral disturbance: Secondary | ICD-10-CM

## 2018-12-15 DIAGNOSIS — F03918 Unspecified dementia, unspecified severity, with other behavioral disturbance: Secondary | ICD-10-CM

## 2018-12-15 NOTE — Progress Notes (Signed)
Location:    [facility] Room Number: 105/D Place of Service:  SNF (31)   CODE STATUS: DNR  Allergies  Allergen Reactions  . Hydrochlorothiazide Other (See Comments)    Reaction:  Dizziness   . Lisinopril Cough  . Other Other (See Comments)    Pt states that she is allergic to multiple BP meds -- does not recall the names.   Reaction:  Dizziness   . Spironolactone Other (See Comments)    Reaction:  Blurred vision and sweating of feet     Chief Complaint  Patient presents with  . 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 There are no reports of changes in her appetite; her weight is without significant recent change. There are no reports of uncontrolled pain no reports of constipation or diarrhea. She is presently taking buspar 7.5 mg three times daily for anxiety. The staff feels as though she has reached the maximum beneficial dose. She is presently taking seroquel 25 mg nightly. She continues to have frequent episodes of yelling out; which is heard throughout the unit. The night dose is not managing her outbursts. She may well benefit with an AM dose of seroquel.   Past Medical History:  Diagnosis Date  . Arthritis    osteoporosis  . Back pain   . Chronic obstructive pulmonary disease (COPD) ([place])   . COPD (chronic obstructive pulmonary disease) ([place])   . Dementia ([place])   . Gait instability   . Glaucoma   . Glaucoma   . Hypertension   . Neck pain   . Parkinson's disease ([place])   . Tremor     Past Surgical History:  Procedure Laterality Date  . CATARACT EXTRACTION    . EYE SURGERY    . TONSILLECTOMY    . TUBAL LIGATION      Social History   Socioeconomic History  . Marital status: Married   Spouse name: Hubbard Robinson  . Number of children: 5  . Years of education: 3  . Highest education level: Not on file  Occupational History  . Not on file  Social Needs  . Financial resource strain: Not on file  . Food insecurity    Worry: Not on file    Inability: Not on file  . Transportation needs    Medical: Not on file    Non-medical: Not on file  Tobacco Use  . Smoking status: Never Smoker  . Smokeless tobacco: Never Used  Substance and Sexual Activity  . Alcohol use: No  . Drug use: No  . Sexual activity: Not on file  Lifestyle  . Physical activity    Days per week: Not on file    Minutes per session: Not on file  . Stress: Not on file  Relationships  . Social Herbalist on phone: Not on file    Gets together: Not on file    Attends religious service: Not on file    Active member of club or organization: Not on file    Attends meetings of clubs or organizations: Not on file    Relationship status: Not on file  . Intimate partner violence    Fear of current or ex  partner: Not on file    Emotionally abused: Not on file    Physically abused: Not on file    Forced sexual activity: Not on file  Other Topics Concern  . Not on file  Social History Narrative   Lives at home with husband, family   Caffeine use- tea 2 cups daily, decaf green tea    Family History  Problem Relation Age of Onset  . Heart failure Mother       VITAL SIGNS BP (!) 139/105   Pulse 70   Temp 98.9 F (37.2 C) (Oral)   Resp 19   Ht 4\' 8"  (1.422 m)   Wt 88 lb 12.8 oz (40.3 kg)   SpO2 98%   BMI 19.91 kg/m   Outpatient Encounter Medications as of 12/15/2018  Medication Sig  . acetaminophen (TYLENOL) 325 MG tablet Take 650 mg by mouth every 6 (six) hours as needed.  Marland Kitchen. aspirin EC 81 MG tablet Take 81 mg by mouth daily.  . bisacodyl (DULCOLAX) 10 MG suppository Place 1 suppository (10 mg total) rectally as needed for moderate constipation.  . busPIRone (BUSPAR) 7.5 MG tablet Take  7.5 mg by mouth 3 (three) times daily.   . cholecalciferol (VITAMIN D3) 25 MCG (1000 UT) tablet Take 1,000 Units by mouth daily.  Marland Kitchen. docusate sodium (COLACE) 100 MG capsule Take 1 capsule (100 mg total) by mouth 2 (two) times daily.  Marland Kitchen. linaclotide (LINZESS) 145 MCG CAPS capsule Take 1 capsule (145 mcg total) by mouth daily before breakfast.  . Melatonin 3 MG TABS Take 3 mg by mouth at bedtime.  . Menthol (ICY HOT BACK) 5 % PTCH Apply 1 patch topically daily. To back for lower back pain and remove after 12 hours  . Multiple Vitamin (MULTIVITAMIN) tablet Take 1 tablet by mouth daily.  . nitroGLYCERIN (NITROSTAT) 0.4 MG SL tablet Place 1 tablet under the tongue as directed. Place 1 tablet under the tongue every five minutes as needed for emergency chest pain  . NON FORMULARY Diet Type:  Regular  . Nutritional Supplements (ENSURE ENLIVE PO) Take 1 Bottle by mouth 3 (three) times daily.  . pantoprazole (PROTONIX) 40 MG tablet Take 40 mg by mouth daily.  . polyethylene glycol (MIRALAX / GLYCOLAX) 17 g packet Take 17 g by mouth daily.  . QUEtiapine (SEROQUEL) 25 MG tablet Take 25 mg by mouth at bedtime.  . rotigotine (NEUPRO) 4 MG/24HR Place 1 patch onto the skin daily.  . timolol (BETIMOL) 0.5 % ophthalmic solution Place 1 drop into both eyes 2 (two) times daily.   No facility-administered encounter medications on file as of 12/15/2018.      SIGNIFICANT DIAGNOSTIC EXAMS   LABS REVIEWED PREVIOUS:   04-05-18: wbc 9.1; hgb 14.7; hc6 46.9; mcv 96.;3 plt 272  glucose 98; bun 24; creat 0.68 k+ 4.7; na++ 139 ca 9.5 liver normal albumin 3.9;  09-02-18: glucose 114; bun 26; creat 0.65; k+ 4.0; na++ 139; ca 8.3; liver normal albumin 3.7    NO NEW LABS.   Review of Systems  Unable to perform ROS: Dementia (unable to participate )    Physical Exam Constitutional:      General: She is not in acute distress.    Appearance: She is cachectic. She is not diaphoretic.  Eyes:     Comments: History of  cataract surgery   Neck:     Musculoskeletal: Neck supple.     Thyroid: No thyromegaly.  Cardiovascular:     Rate  and Rhythm: Normal rate and regular rhythm.     Pulses: Normal pulses.     Heart sounds: Normal heart sounds.  Pulmonary:     Effort: Pulmonary effort is normal. No respiratory distress.     Breath sounds: Normal breath sounds.  Abdominal:     General: Bowel sounds are normal. There is no distension.     Palpations: Abdomen is soft.     Tenderness: There is no abdominal tenderness.  Musculoskeletal:     Right lower leg: No edema.     Left lower leg: No edema.     Comments: Is able to move all extremities Uses wheelchair Has stiffness present; has bilateral tremor present        Lymphadenopathy:     Cervical: No cervical adenopathy.  Skin:    General: Skin is warm and dry.  Neurological:     Mental Status: She is alert. Mental status is at baseline.  Psychiatric:        Mood and Affect: Mood normal.      ASSESSMENT/ PLAN:  TODAY:   1. 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 Will continue to monitor her status.   2. Chronic anxiety: is without change: will continue buspar 7.5 mg three times daily. At this time increasing this medication will not provider her with any benefit.   3. GERD without esophagitis: is stable will continue protonix 40 mg daily   PREVIOUS   4. Essential hypertension: is stable b/p 133/75 (recheck)  will continue asa 81 mg daily  5. Simple chronic bronchitis: is stable will continue to monitor her status.   6. Chronic low back pain without sciatica unspecified back pain laterality: is stable will continue menthol patch daily   7. Bilateral chronic primary angle closure glaucoma indeterminate age: is stable will continue timolol to both eyes twice daily  8; dementia due to parkinson's disease with behavioral  disturbance: is without change weight is 84 pounds will monitor   9. Chronic constipation: is stable will continue colace twice daily; linzess 145 mcg daily and miralax daily   10. Age related osteoporosis without current pathological fracture: is without change; will continue to monitor her status. Is off prolia  11. Parkinson's disease: is without change; will continue neupro 5 mcg patch daily    MD is aware of resident's narcotic use and is in agreement with current plan of care. We will attempt to wean resident as appropriate.  Synthia Innocent NP [facility]  Contact [phone] Monday through Friday 8am- 5pm  After hours call [phone]

## 2018-12-17 DIAGNOSIS — F03918 Unspecified dementia, unspecified severity, with other behavioral disturbance: Secondary | ICD-10-CM | POA: Insufficient documentation

## 2018-12-17 DIAGNOSIS — F0391 Unspecified dementia with behavioral disturbance: Secondary | ICD-10-CM | POA: Insufficient documentation

## 2019-01-13 ENCOUNTER — Encounter: Payer: Self-pay | Admitting: Adult Health

## 2019-01-13 ENCOUNTER — Non-Acute Institutional Stay (SKILLED_NURSING_FACILITY): Payer: Medicare Other | Admitting: Adult Health

## 2019-01-13 DIAGNOSIS — G8929 Other chronic pain: Secondary | ICD-10-CM

## 2019-01-13 DIAGNOSIS — M545 Low back pain: Secondary | ICD-10-CM

## 2019-01-13 DIAGNOSIS — I10 Essential (primary) hypertension: Secondary | ICD-10-CM | POA: Diagnosis not present

## 2019-01-13 DIAGNOSIS — J410 Simple chronic bronchitis: Secondary | ICD-10-CM | POA: Diagnosis not present

## 2019-01-13 NOTE — Progress Notes (Signed)
Location:    [facility] Room Number: 105/D Place of Service:  SNF (31)   CODE STATUS: DNR  Allergies  Allergen Reactions  . Hydrochlorothiazide Other (See Comments)    Reaction:  Dizziness   . Lisinopril Cough  . Other Other (See Comments)    Pt states that she is allergic to multiple BP meds -- does not recall the names.   Reaction:  Dizziness   . Spironolactone Other (See Comments)    Reaction:  Blurred vision and sweating of feet    Chief Complaint  Patient presents with  . Medical Management of Chronic Issues          Essential hypertension; . Simple chronic bronchitis: Chronic low back pain without sciatica unspecified back pain laterality    HPI:  She is a 85 year old long term resident of this facility being seen for the management of her chronic illnesses; hypertension; back pain; bronchitis. There are no reports of cough or shortness of breath. No reports of uncontrolled pain; she does yell out infrequently.   Past Medical History:  Diagnosis Date  . Arthritis    osteoporosis  . Back pain   . Chronic obstructive pulmonary disease (COPD) ([place])   . COPD (chronic obstructive pulmonary disease) ([place])   . Dementia ([place])   . Gait instability   . Glaucoma   . Glaucoma   . Hypertension   . Neck pain   . Parkinson's disease ([place])   . Tremor     Past Surgical History:  Procedure Laterality Date  . CATARACT EXTRACTION    . EYE SURGERY    . TONSILLECTOMY    . TUBAL LIGATION      Social History   Socioeconomic History  . Marital status: Married    Spouse name: Hubbard Robinson  . Number of children: 5  . Years of education: 3  . Highest education level: Not on file  Occupational History  . Not on file  Social Needs  . Financial resource strain: Not on file  . Food insecurity    Worry: Not on file    Inability: Not on file  . Transportation needs    Medical: Not on file    Non-medical: Not on file  Tobacco Use  . Smoking status: Never  Smoker  . Smokeless tobacco: Never Used  Substance and Sexual Activity  . Alcohol use: No  . Drug use: No  . Sexual activity: Not on file  Lifestyle  . Physical activity    Days per week: Not on file    Minutes per session: Not on file  . Stress: Not on file  Relationships  . Social Herbalist on phone: Not on file    Gets together: Not on file    Attends religious service: Not on file    Active member of club or organization: Not on file    Attends meetings of clubs or organizations: Not on file    Relationship status: Not on file  . Intimate partner violence    Fear of current or ex partner: Not on file    Emotionally abused: Not on file    Physically abused: Not on file    Forced sexual activity: Not on file  Other Topics Concern  . Not on file  Social History Narrative   Lives at home with husband, family   Caffeine use- tea 2 cups daily, decaf Jordin Vicencio tea    Family History  Problem  Relation Age of Onset  . Heart failure Mother       VITAL SIGNS BP 128/66   Pulse (!) 57   Temp 98.2 F (36.8 C) (Oral)   Resp 12   Ht 4\' 8"  (1.422 m)   Wt 87 lb 9.6 oz (39.7 kg)   SpO2 98%   BMI 19.64 kg/m   Outpatient Encounter Medications as of 01/13/2019  Medication Sig  . acetaminophen (TYLENOL) 325 MG tablet Take 650 mg by mouth every 6 (six) hours as needed.  03/15/2019 aspirin EC 81 MG tablet Take 81 mg by mouth daily.  . bisacodyl (DULCOLAX) 10 MG suppository Place 1 suppository (10 mg total) rectally as needed for moderate constipation.  . busPIRone (BUSPAR) 7.5 MG tablet Take 7.5 mg by mouth 3 (three) times daily.   . cholecalciferol (VITAMIN D3) 25 MCG (1000 UT) tablet Take 1,000 Units by mouth daily.  Marland Kitchen docusate sodium (COLACE) 100 MG capsule Take 1 capsule (100 mg total) by mouth 2 (two) times daily.  Marland Kitchen linaclotide (LINZESS) 145 MCG CAPS capsule Take 1 capsule (145 mcg total) by mouth daily before breakfast.  . Melatonin 3 MG TABS Take 3 mg by mouth at bedtime.  .  Multiple Vitamin (MULTIVITAMIN) tablet Take 1 tablet by mouth daily.  . nitroGLYCERIN (NITROSTAT) 0.4 MG SL tablet Place 1 tablet under the tongue as directed. Place 1 tablet under the tongue every five minutes as needed for emergency chest pain  . NON FORMULARY Diet Type:  Regular  . Nutritional Supplements (ENSURE ENLIVE PO) Take 1 Bottle by mouth 3 (three) times daily.  . pantoprazole (PROTONIX) 40 MG tablet Take 40 mg by mouth daily.  . polyethylene glycol (MIRALAX / GLYCOLAX) 17 g packet Take 17 g by mouth daily.  . QUEtiapine (SEROQUEL) 25 MG tablet Take 25 mg by mouth at bedtime.  Marland Kitchen QUEtiapine (SEROQUEL) 25 MG tablet Take 12.5 mg by mouth daily.  . rotigotine (NEUPRO) 4 MG/24HR Place 1 patch onto the skin daily.  . timolol (BETIMOL) 0.5 % ophthalmic solution Place 1 drop into both eyes 2 (two) times daily.  . [DISCONTINUED] Menthol (ICY HOT BACK) 5 % PTCH Apply 1 patch topically daily. To back for lower back pain and remove after 12 hours   No facility-administered encounter medications on file as of 01/13/2019.      SIGNIFICANT DIAGNOSTIC EXAMS   LABS REVIEWED PREVIOUS:   04-05-18: wbc 9.1; hgb 14.7; hc6 46.9; mcv 96.;3 plt 272  glucose 98; bun 24; creat 0.68 k+ 4.7; na++ 139 ca 9.5 liver normal albumin 3.9;  09-02-18: glucose 114; bun 26; creat 0.65; k+ 4.0; na++ 139; ca 8.3; liver normal albumin 3.7    NO NEW LABS.   Review of Systems  Unable to perform ROS: Dementia (unable to participate )    Physical Exam Constitutional:      General: She is not in acute distress.    Appearance: She is cachectic. She is not diaphoretic.  Eyes:     Comments: History of cataract surgery    Neck:     Musculoskeletal: Neck supple.     Thyroid: No thyromegaly.  Cardiovascular:     Rate and Rhythm: Normal rate and regular rhythm.     Pulses: Normal pulses.     Heart sounds: Normal heart sounds.  Pulmonary:     Effort: Pulmonary effort is normal. No respiratory distress.     Breath  sounds: Normal breath sounds.  Abdominal:     General:  Bowel sounds are normal. There is no distension.     Palpations: Abdomen is soft.     Tenderness: There is no abdominal tenderness.  Musculoskeletal:     Right lower leg: No edema.     Left lower leg: No edema.     Comments:  Is able to move all extremities Uses wheelchair Has stiffness present; has bilateral tremor present         Lymphadenopathy:     Cervical: No cervical adenopathy.  Skin:    General: Skin is warm and dry.  Neurological:     Mental Status: She is alert. Mental status is at baseline.  Psychiatric:        Mood and Affect: Mood normal.        ASSESSMENT/ PLAN:  TODAY:   1. Essential hypertension; is stable b/p 128/66 will continue asa 81 mg daily   2. Simple chronic bronchitis: is stable will continue to monitor her status.   3. Chronic low back pain without sciatica unspecified back pain laterality: is stable will continue to monitor her status.   PREVIOUS   4. Bilateral chronic primary angle closure glaucoma indeterminate age: is stable will continue timolol to both eyes twice daily  5; dementia due to parkinson's disease with behavioral disturbance: is without change weight is 87 pounds will monitor   6. Chronic constipation: is stable will continue colace twice daily; linzess 145 mcg daily and miralax daily   7. Age related osteoporosis without current pathological fracture: is without change; will continue to monitor her status. Is off prolia  8. Parkinson's disease: is without change; will continue neupro 5 mcg patch daily  9. 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 Will continue to monitor her status.   10. Chronic anxiety: is without change: will continue buspar 7.5 mg three times daily. At this time increasing this medication will not provider her with any benefit.   11.  GERD without esophagitis: is stable will continue protonix 40 mg daily     MD is aware of resident's narcotic use and is in agreement with current plan of care. We will attempt to wean resident as appropriate.  Synthia Innocenteborah Juwuan Sedita NP [facility]  Contact [phone] Monday through Friday 8am- 5pm  After hours call [phone]

## 2019-01-16 ENCOUNTER — Encounter (HOSPITAL_COMMUNITY)
Admission: RE | Admit: 2019-01-16 | Discharge: 2019-01-16 | Disposition: A | Discharge status: Home / Self Care | Payer: Medicare Other | Source: Ambulatory Visit | Attending: Adult Health | Admitting: Adult Health

## 2019-01-16 DIAGNOSIS — F0391 Unspecified dementia with behavioral disturbance: Secondary | ICD-10-CM | POA: Diagnosis not present

## 2019-01-16 DIAGNOSIS — K219 Gastro-esophageal reflux disease without esophagitis: Secondary | ICD-10-CM | POA: Insufficient documentation

## 2019-01-16 DIAGNOSIS — G20 Parkinson's disease: Secondary | ICD-10-CM | POA: Insufficient documentation

## 2019-01-16 DIAGNOSIS — F419 Anxiety disorder, unspecified: Secondary | ICD-10-CM | POA: Diagnosis not present

## 2019-01-16 DIAGNOSIS — I10 Essential (primary) hypertension: Secondary | ICD-10-CM | POA: Insufficient documentation

## 2019-01-16 LAB — CBC
HCT: 45.1 % (ref 36.0–46.0)
Hemoglobin: 14.7 g/dL (ref 12.0–15.0)
MCH: 32.3 pg (ref 26.0–34.0)
MCHC: 32.6 g/dL (ref 30.0–36.0)
MCV: 99.1 fL (ref 80.0–100.0)
Platelets: 275 10*3/uL (ref 150–400)
RBC: 4.55 MIL/uL (ref 3.87–5.11)
RDW: 13 % (ref 11.5–15.5)
WBC: 10 10*3/uL (ref 4.0–10.5)
nRBC: 0 % (ref 0.0–0.2)

## 2019-01-16 LAB — COMPREHENSIVE METABOLIC PANEL
ALT: 12 U/L (ref 0–44)
AST: 22 U/L (ref 15–41)
Albumin: 3.7 g/dL (ref 3.5–5.0)
Alkaline Phosphatase: 77 U/L (ref 38–126)
Anion gap: 9 (ref 5–15)
BUN: 23 mg/dL (ref 8–23)
CO2: 28 mmol/L (ref 22–32)
Calcium: 9.3 mg/dL (ref 8.9–10.3)
Chloride: 102 mmol/L (ref 98–111)
Creatinine, Ser: 0.82 mg/dL (ref 0.44–1.00)
GFR calc Af Amer: >60 mL/min (ref >60)
GFR calc non Af Amer: >60 mL/min (ref >60)
Glucose, Bld: 104 mg/dL — ABNORMAL HIGH (ref 70–99)
Potassium: 4.2 mmol/L (ref 3.5–5.1)
Sodium: 139 mmol/L (ref 135–145)
Total Bilirubin: 1.2 mg/dL (ref 0.3–1.2)
Total Protein: 8.7 g/dL — ABNORMAL HIGH (ref 6.5–8.1)

## 2019-01-18 ENCOUNTER — Other Ambulatory Visit: Payer: Self-pay

## 2019-02-01 ENCOUNTER — Other Ambulatory Visit: Payer: Self-pay | Admitting: Adult Health

## 2019-02-03 DIAGNOSIS — L603 Nail dystrophy: Secondary | ICD-10-CM | POA: Diagnosis not present

## 2019-02-03 DIAGNOSIS — I739 Peripheral vascular disease, unspecified: Secondary | ICD-10-CM | POA: Diagnosis not present

## 2019-02-03 DIAGNOSIS — M79675 Pain in left toe(s): Secondary | ICD-10-CM | POA: Diagnosis not present

## 2019-02-03 DIAGNOSIS — B351 Tinea unguium: Secondary | ICD-10-CM | POA: Diagnosis not present

## 2019-02-03 DIAGNOSIS — M79674 Pain in right toe(s): Secondary | ICD-10-CM | POA: Diagnosis not present

## 2019-02-15 ENCOUNTER — Non-Acute Institutional Stay (SKILLED_NURSING_FACILITY): Payer: Medicare Other | Admitting: Internal Medicine

## 2019-02-15 ENCOUNTER — Encounter: Payer: Self-pay | Admitting: Internal Medicine

## 2019-02-15 DIAGNOSIS — M810 Age-related osteoporosis without current pathological fracture: Secondary | ICD-10-CM

## 2019-02-15 DIAGNOSIS — F419 Anxiety disorder, unspecified: Secondary | ICD-10-CM | POA: Diagnosis not present

## 2019-02-15 DIAGNOSIS — K5909 Other constipation: Secondary | ICD-10-CM

## 2019-02-15 NOTE — Progress Notes (Signed)
Location:  [facility] Room Number: 105-D Place of Service:  SNF (31)  Hennie Duos, MD  Patient Care Team: Hennie Duos, MD as PCP - General (Internal Medicine) [facility], [place] ([place]) Nyoka Cowden, Phylis Bougie, NP as Nurse Practitioner (Geriatric Medicine)  Extended Emergency Contact Information Primary Emergency Contact: Aziz,[name] Address: [address]          [place], [place] [address] Johnnette Litter of [place] Phone: [phone] Relation: Spouse Secondary Emergency Contact: Jolicoeur,[name] Address: [address]          [place], [place] [address] Johnnette Litter of [place] Phone: [phone] Mobile Phone: [phone] Relation: Son    Allergies: Hydrochlorothiazide, Lisinopril, Other, and Spironolactone  Chief Complaint  Patient presents with  . Medical Management of Chronic Issues    Routine [facility] visit    HPI: Patient is an 85 y.o. female who is being seen for routine issues of constipation, osteoporosis, and anxiety.  Past Medical History:  Diagnosis Date  . Arthritis    osteoporosis  . Back pain   . Chronic obstructive pulmonary disease (COPD) ([place])   . COPD (chronic obstructive pulmonary disease) ([place])   . Dementia ([place])   . Gait instability   . Glaucoma   . Glaucoma   . Hypertension   . Neck pain   . Parkinson's disease ([place])   . Tremor     Past Surgical History:  Procedure Laterality Date  . CATARACT EXTRACTION    . EYE SURGERY    . TONSILLECTOMY    . TUBAL LIGATION      Allergies as of 02/15/2019      Reactions   Hydrochlorothiazide Other (See Comments)   Reaction:  Dizziness    Lisinopril Cough   Other Other (See Comments)   Pt states that she is allergic to multiple BP meds -- does not recall the names.   Reaction:  Dizziness    Spironolactone Other (See Comments)   Reaction:  Blurred vision and sweating of feet       Medication List    Notice   This visit  is during an admission. Changes to the med list made in this visit will be reflected in the After Visit Summary of the admission.     No orders of the defined types were placed in this encounter.   Immunization History  Administered Date(s) Administered  . Influenza, High Dose Seasonal PF 01/29/2016  . Influenza-Unspecified 01/29/2011, 01/27/2012, 02/02/2013, 01/16/2014, 01/13/2018  . Pneumococcal Conjugate-13 02/20/2014, 01/19/2018  . Pneumococcal Polysaccharide-23 05/05/2012, 01/03/2019  . Pneumococcal-Unspecified 01/19/2018  . Tdap 02/20/2014, 01/19/2018    Social History   Tobacco Use  . Smoking status: Never Smoker  . Smokeless tobacco: Never Used  Substance Use Topics  . Alcohol use: No    Review of Systems   unable to obtain secondary to dementia; nursing-no acute concerns    Vitals:   02/15/19 0933  BP: (!) 146/75  Pulse: 60  Resp: 18  Temp: 98 F (36.7 C)  SpO2: 98%   Body mass index is 20.31 kg/m. Physical Exam  GENERAL APPEARANCE: Alert, No acute distress  SKIN: No diaphoresis rash HEENT: Unremarkable RESPIRATORY: Breathing is even, unlabored. Lung sounds are clear   CARDIOVASCULAR: Heart RRR no murmurs, rubs or gallops. No peripheral edema  GASTROINTESTINAL: Abdomen is soft, non-tender, not distended w/ normal bowel sounds.  GENITOURINARY: Bladder non tender, not distended  MUSCULOSKELETAL: No abnormal joints or musculature except very  thin NEUROLOGIC: Cranial nerves 2-12 grossly intact. Moves all extremities with stiffness and bilateral tremor PSYCHIATRIC: Dementia, yells out at times  Patient Active Problem List   Diagnosis Date Noted  . Psychosis in elderly with behavioral disturbance (HCC) 12/17/2018  . GERD without esophagitis 06/16/2018  . Age-related osteoporosis without current pathological fracture 06/16/2018  . Chronic low back pain without sciatica 06/16/2018  . Bilateral chronic primary angle-closure glaucoma, indeterminate stage  06/16/2018  . Chronic anxiety 06/16/2018  . Rectal bleeding 12/27/2017  . Anxiety 11/25/2017  . Palliative care by specialist   . Goals of care, counseling/discussion   . Chronic constipation   . UTI (urinary tract infection) 08/20/2017  . Proctitis 08/20/2017  . Recurrent falls 04/26/2017  . Dementia (HCC) 04/26/2017  . COPD (chronic obstructive pulmonary disease) (HCC) 04/22/2017  . HTN (hypertension) 04/22/2017  . Parkinson's disease (HCC) 04/22/2017  . Glaucoma 04/22/2017  . Osteoporosis 04/22/2017  . Bilateral fracture of pubic rami (HCC) 04/22/2017  . Dizziness 10/13/2016    CMP     Component Value Date/Time   NA 139 01/16/2019 0800   K 4.2 01/16/2019 0800   CL 102 01/16/2019 0800   CO2 28 01/16/2019 0800   GLUCOSE 104 (H) 01/16/2019 0800   BUN 23 01/16/2019 0800   CREATININE 0.82 01/16/2019 0800   CALCIUM 9.3 01/16/2019 0800   PROT 8.7 (H) 01/16/2019 0800   ALBUMIN 3.7 01/16/2019 0800   AST 22 01/16/2019 0800   ALT 12 01/16/2019 0800   ALKPHOS 77 01/16/2019 0800   BILITOT 1.2 01/16/2019 0800   GFRNONAA >60 01/16/2019 0800   GFRAA >60 01/16/2019 0800   Recent Labs    04/05/18 0700 09/02/18 0615 01/16/19 0800  NA 139 139 139  K 4.7 4.0 4.2  CL 104 100 102  CO2 27 29 28   GLUCOSE 98 114* 104*  BUN 24* 26* 23  CREATININE 0.68 0.65 0.82  CALCIUM 9.5 9.3 9.3   Recent Labs    04/05/18 0700 09/02/18 0615 01/16/19 0800  AST 26 24 22   ALT 17 14 12   ALKPHOS 65 63 77  BILITOT 1.1 0.9 1.2  PROT 8.4* 8.3* 8.7*  ALBUMIN 3.9 3.7 3.7   Recent Labs    03/11/2018 1401 04/05/18 0700 01/16/19 0800  WBC 7.8 9.1 10.0  NEUTROABS 4.1 5.5  --   HGB 12.9 14.7 14.7  HCT 41.0 46.9* 45.1  MCV 95.8 96.3 99.1  PLT 247 272 275   No results for input(s): CHOL, LDLCALC, TRIG in the last 8760 hours.  Invalid input(s): HCL No results found for: MICROALBUR Lab Results  Component Value Date   TSH 1.419 06/16/2017   No results found for: HGBA1C No results found for:  CHOL, HDL, LDLCALC, LDLDIRECT, TRIG, CHOLHDL  Significant Diagnostic Results in last 30 days:  No results found.  Assessment and Plan  Chronic constipation No reports of problems; continue MiraLAX 17 g daily, and Linzess 145 mcg daily along with as needed Dulcolax  Age-related osteoporosis without current pathological fracture Without pathological fracture; will continue to monitor as patient is now off Prolia  Anxiety Appears controlled; continue BuSpar 7.5 mg 3 times daily      04/07/18, MD

## 2019-02-19 ENCOUNTER — Encounter: Payer: Self-pay | Admitting: Internal Medicine

## 2019-02-19 NOTE — Assessment & Plan Note (Signed)
Without pathological fracture; will continue to monitor as patient is now off Prolia

## 2019-02-19 NOTE — Assessment & Plan Note (Signed)
Appears controlled; continue BuSpar 7.5 mg 3 times daily

## 2019-02-19 NOTE — Assessment & Plan Note (Addendum)
No reports of problems; continue MiraLAX 17 g daily, and Linzess 145 mcg daily along with as needed Dulcolax

## 2019-02-20 DIAGNOSIS — Z7951 Long term (current) use of inhaled steroids: Secondary | ICD-10-CM | POA: Diagnosis not present

## 2019-02-20 DIAGNOSIS — H04123 Dry eye syndrome of bilateral lacrimal glands: Secondary | ICD-10-CM | POA: Diagnosis not present

## 2019-02-20 DIAGNOSIS — H401134 Primary open-angle glaucoma, bilateral, indeterminate stage: Secondary | ICD-10-CM | POA: Diagnosis not present

## 2019-02-20 DIAGNOSIS — Z961 Presence of intraocular lens: Secondary | ICD-10-CM | POA: Diagnosis not present

## 2019-02-22 ENCOUNTER — Non-Acute Institutional Stay (SKILLED_NURSING_FACILITY): Payer: Medicare Other | Admitting: Adult Health

## 2019-02-22 ENCOUNTER — Encounter: Payer: Self-pay | Admitting: Adult Health

## 2019-02-22 DIAGNOSIS — F03918 Unspecified dementia, unspecified severity, with other behavioral disturbance: Secondary | ICD-10-CM

## 2019-02-22 DIAGNOSIS — F0391 Unspecified dementia with behavioral disturbance: Secondary | ICD-10-CM

## 2019-02-22 DIAGNOSIS — F0281 Dementia in other diseases classified elsewhere with behavioral disturbance: Secondary | ICD-10-CM

## 2019-02-22 DIAGNOSIS — G20 Parkinson's disease: Secondary | ICD-10-CM

## 2019-02-22 NOTE — Progress Notes (Signed)
Location:  [facility] Room Number: 105-D Place of Service:  SNF (31)   CODE STATUS: DNR  Allergies  Allergen Reactions  . Hydrochlorothiazide Other (See Comments)    Reaction:  Dizziness   . Lisinopril Cough  . Other Other (See Comments)    Pt states that she is allergic to multiple BP meds -- does not recall the names.   Reaction:  Dizziness   . Spironolactone Other (See Comments)    Reaction:  Blurred vision and sweating of feet     Chief Complaint  Patient presents with  . Acute Visit    care plan meeting     HPI:  We have come together for her care plan meeting. Her family is present via phone. Unable to do BIMS or mood. Her weight is 92 pounds has had a slight weight gain. There are no recent falls. Her appetite is good. She will on occasions yell out. There are no reports of uncontrolled pain. She continues to be followed for her chronic illnesses including: dementia; psychosis parkinson   Past Medical History:  Diagnosis Date  . Arthritis    osteoporosis  . Back pain   . Chronic obstructive pulmonary disease (COPD) (HCC)   . COPD (chronic obstructive pulmonary disease) (HCC)   . Dementia (HCC)   . Gait instability   . Glaucoma   . Glaucoma   . Hypertension   . Neck pain   . Parkinson's disease (HCC)   . Tremor     Past Surgical History:  Procedure Laterality Date  . CATARACT EXTRACTION    . EYE SURGERY    . TONSILLECTOMY    . TUBAL LIGATION      Social History   Socioeconomic History  . Marital status: Married    Spouse name: Normand Sloopheodore  . Number of children: 5  . Years of education: 3  . Highest education level: Not on file  Occupational History  . Not on file  Social Needs  . Financial resource strain: Not on file  . Food insecurity    Worry: Not on file    Inability: Not on file  . Transportation needs    Medical: Not on file    Non-medical: Not on file  Tobacco Use  . Smoking status: Never Smoker  . Smokeless  tobacco: Never Used  Substance and Sexual Activity  . Alcohol use: No  . Drug use: No  . Sexual activity: Not on file  Lifestyle  . Physical activity    Days per week: Not on file    Minutes per session: Not on file  . Stress: Not on file  Relationships  . Social Musicianconnections    Talks on phone: Not on file    Gets together: Not on file    Attends religious service: Not on file    Active member of club or organization: Not on file    Attends meetings of clubs or organizations: Not on file    Relationship status: Not on file  . Intimate partner violence    Fear of current or ex partner: Not on file    Emotionally abused: Not on file    Physically abused: Not on file    Forced sexual activity: Not on file  Other Topics Concern  . Not on file  Social History Narrative   Lives at home with husband, family   Caffeine use- tea 2 cups daily, decaf Jermell Holeman tea    Family History  Problem  Relation Age of Onset  . Heart failure Mother       VITAL SIGNS BP 127/68   Pulse 72   Temp 98.1 F (36.7 C) (Oral)   Resp 20   Ht 4\' 6"  (1.372 m)   Wt 92 lb 9.6 oz (42 kg)   SpO2 98%   BMI 22.33 kg/m   Outpatient Encounter Medications as of 02/22/2019  Medication Sig  . acetaminophen (TYLENOL) 325 MG tablet Take 650 mg by mouth every 6 (six) hours as needed.  Marland Kitchen aspirin EC 81 MG tablet Take 81 mg by mouth daily.  . bisacodyl (DULCOLAX) 10 MG suppository Place 1 suppository (10 mg total) rectally as needed for moderate constipation.  . busPIRone (BUSPAR) 7.5 MG tablet Take 7.5 mg by mouth 3 (three) times daily.   . cholecalciferol (VITAMIN D3) 25 MCG (1000 UT) tablet Take 1,000 Units by mouth daily.  Marland Kitchen docusate sodium (COLACE) 100 MG capsule Take 1 capsule (100 mg total) by mouth 2 (two) times daily.  . Ensure (ENSURE) Take 237 mLs by mouth 3 (three) times daily between meals.  . Melatonin 3 MG TABS Take 3 mg by mouth at bedtime.  . Multiple Vitamin (MULTIVITAMIN) tablet Take 1 tablet by  mouth daily.  . nitroGLYCERIN (NITROSTAT) 0.4 MG SL tablet Place 1 tablet under the tongue as directed. Place 1 tablet under the tongue every five minutes as needed for emergency chest pain  . NON FORMULARY Diet Type:  Regular  . pantoprazole (PROTONIX) 40 MG tablet Take 40 mg by mouth daily.  . polyethylene glycol (MIRALAX / GLYCOLAX) 17 g packet Take 17 g by mouth daily.  . QUEtiapine (SEROQUEL) 25 MG tablet Take 25 mg by mouth at bedtime.  Marland Kitchen QUEtiapine (SEROQUEL) 25 MG tablet Take 12.5 mg by mouth daily.  . rotigotine (NEUPRO) 4 MG/24HR Place 1 patch onto the skin daily.  . timolol (BETIMOL) 0.5 % ophthalmic solution Place 1 drop into both eyes 2 (two) times daily.  . [DISCONTINUED] linaclotide (LINZESS) 145 MCG CAPS capsule Take 1 capsule (145 mcg total) by mouth daily before breakfast.   No facility-administered encounter medications on file as of 02/22/2019.      SIGNIFICANT DIAGNOSTIC EXAMS   LABS REVIEWED PREVIOUS:   04-05-18: wbc 9.1; hgb 14.7; hc6 46.9; mcv 96.;3 plt 272  glucose 98; bun 24; creat 0.68 k+ 4.7; na++ 139 ca 9.5 liver normal albumin 3.9;  09-02-18: glucose 114; bun 26; creat 0.65; k+ 4.0; na++ 139; ca 8.3; liver normal albumin 3.7    TODAY;   01-16-19: wbc 10.0; hgb 14.7; hct 45.1; mcv 99.1 plt 275; glucose 104; bun 23; creat 0.82; k+ 4.2; na++ 139; ca 9.3 liver normal albumin 3.7      Review of Systems  Unable to perform ROS: Dementia (unable to participate )     Physical Exam Constitutional:      General: She is not in acute distress.    Appearance: She is cachectic. She is not diaphoretic.  Eyes:     Comments: History of cataract surgery     Neck:     Musculoskeletal: Neck supple.     Thyroid: No thyromegaly.  Cardiovascular:     Rate and Rhythm: Normal rate and regular rhythm.     Heart sounds: Normal heart sounds.  Pulmonary:     Effort: Pulmonary effort is normal. No respiratory distress.     Breath sounds: Normal breath sounds.  Abdominal:      General: Bowel sounds  are normal. There is no distension.     Palpations: Abdomen is soft.     Tenderness: There is no abdominal tenderness.  Musculoskeletal:     Right lower leg: No edema.     Left lower leg: No edema.     Comments: Is able to move all extremities Uses wheelchair Has stiffness present; has bilateral tremor present          Lymphadenopathy:     Cervical: No cervical adenopathy.  Skin:    General: Skin is warm and dry.  Neurological:     Mental Status: She is alert. Mental status is at baseline.  Psychiatric:        Mood and Affect: Mood normal.       ASSESSMENT/ PLAN:  TODAY   1. Parkinson's disease 2. Dementia due to parkinson's disease with behavioral disturbance 3. 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     MD is aware of resident's narcotic use and is in agreement with current plan of care. We will attempt to wean resident as appropriate.  Synthia Innocent NP [facility]  Contact [phone] Monday through Friday 8am- 5pm  After hours call [phone]

## 2019-03-02 ENCOUNTER — Non-Acute Institutional Stay (SKILLED_NURSING_FACILITY): Payer: Medicare Other | Admitting: Adult Health

## 2019-03-02 ENCOUNTER — Encounter: Payer: Self-pay | Admitting: Adult Health

## 2019-03-02 DIAGNOSIS — F02818 Dementia in other diseases classified elsewhere, unspecified severity, with other behavioral disturbance: Secondary | ICD-10-CM

## 2019-03-02 DIAGNOSIS — F0281 Dementia in other diseases classified elsewhere with behavioral disturbance: Secondary | ICD-10-CM | POA: Diagnosis not present

## 2019-03-02 DIAGNOSIS — Z0000 Encounter for general adult medical examination without abnormal findings: Secondary | ICD-10-CM

## 2019-03-02 DIAGNOSIS — F0391 Unspecified dementia with behavioral disturbance: Secondary | ICD-10-CM

## 2019-03-02 DIAGNOSIS — F03918 Unspecified dementia, unspecified severity, with other behavioral disturbance: Secondary | ICD-10-CM

## 2019-03-02 DIAGNOSIS — G20 Parkinson's disease: Secondary | ICD-10-CM | POA: Diagnosis not present

## 2019-03-02 NOTE — Progress Notes (Signed)
Location:  [place] Room Number: 105/D Place of Service:  SNF (31) Provider: Ok Edwards NP   Patient Care Team: Hennie Duos, MD as PCP - General (Internal Medicine) [facility], [place] ([place]) Nyoka Cowden, Phylis Bougie, NP as Nurse Practitioner (Geriatric Medicine)  Extended Emergency Contact Information Primary Emergency Contact: Harmening,[name] Address: [address]          [place], [place] [address] Johnnette Litter of [place] Phone: [phone] Relation: Spouse Secondary Emergency Contact: Ueda,[name] Address: [address]          [place],  [address] Johnnette Litter of [place] Phone: [phone] Mobile Phone: [phone] Relation: Son  Code Status: dnr Goals of Care: Advanced Directive information Advanced Directives 03/02/2019  Does Patient Have a Medical Advance Directive? Yes  Type of Advance Directive Out of facility DNR (pink MOST or yellow form)  Does patient want to make changes to medical advance directive? No - Patient declined  Copy of [place] in Chart? -  Would patient like information on creating a medical advance directive? -  Pre-existing out of facility DNR order (yellow form or pink MOST form) -     Chief Complaint  Patient presents with  . Medicare Wellness    Annual Wellness Visit    HPI: Patient is a 85 y.o. female seen in today for an annual wellness exam.  She has had one fall without injury. Her weight has been stable over the past year. She does have some occasional episodes of yelling out. There are no reports of uncontrolled pain. She is a long term resident of this facility. She continues to followed for her chronic illnesses including: dementia; parkinson disease pyschosis   Depression screen [facility] 2/9 08/05/2017  Decreased Interest 0  Down, Depressed, Hopeless 0  PHQ - 2 Score 0    Fall Risk  03/02/2019 11/10/2018 10/25/2018 08/05/2017 07/18/2015  Falls in  the past year? 1 (No Data) 0 Yes Yes  Comment - Emmi Telephone Survey: data to providers prior to load - - -  Number falls in past yr: 0 (No Data) - 2 or more 1  Comment - Emmi Telephone Survey Actual Response =  - - -  Injury with Fall? 0 - - Yes No  Comment - - - hip fracture -  Risk for fall due to : Impaired mobility;Impaired balance/gait - Impaired balance/gait;Impaired mobility;Impaired vision - (No Data)  Risk for fall due to: Comment - - - - accidently hit by door that pushed her down     Health Maintenance  Topic Date Due  . INFLUENZA VACCINE  11/12/2018  . TETANUS/TDAP  01/20/2028  . DEXA SCAN  Completed  . PNA vac Low Risk Adult  Completed   Functional Status Survey: Is the patient deaf or have difficulty hearing?: Yes Does the patient have difficulty seeing, even when wearing glasses/contacts?: No Does the patient have difficulty concentrating, remembering, or making decisions?: Yes Does the patient have difficulty walking or climbing stairs?: (wheelchair bound ) Does the patient have difficulty dressing or bathing?: Yes   Past Medical History:  Diagnosis Date  . Arthritis    osteoporosis  . Back pain   . Chronic obstructive pulmonary disease (COPD) ([place])   . COPD (chronic obstructive pulmonary disease) ([place])   . Dementia ([place])   . Gait instability   . Glaucoma   . Glaucoma   . Hypertension   . Neck pain   .  Parkinson's disease (HCC)   . Tremor     Past Surgical History:  Procedure Laterality Date  . CATARACT EXTRACTION    . EYE SURGERY    . TONSILLECTOMY    . TUBAL LIGATION      Family History  Problem Relation Age of Onset  . Heart failure Mother     Social History   Socioeconomic History  . Marital status: Married    Spouse name: Normand Sloop  . Number of children: 5  . Years of education: 3  . Highest education level: Not on file  Occupational History  . Occupation: retired   Engineer, production  . Financial resource strain: Not hard at all  .  Food insecurity    Worry: Never true    Inability: Never true  . Transportation needs    Medical: No    Non-medical: No  Tobacco Use  . Smoking status: Never Smoker  . Smokeless tobacco: Never Used  Substance and Sexual Activity  . Alcohol use: No  . Drug use: No  . Sexual activity: Not Currently  Lifestyle  . Physical activity    Days per week: 0 days    Minutes per session: 0 min  . Stress: Patient refused  Relationships  . Social Musician on phone: Patient refused    Gets together: Patient refused    Attends religious service: Patient refused    Active member of club or organization: Patient refused    Attends meetings of clubs or organizations: Patient refused    Relationship status: Patient refused  Other Topics Concern  . Not on file  Social History Narrative   Long term resident of [facility]     reports that she has never smoked. She has never used smokeless tobacco. She reports that she does not drink alcohol or use drugs.   Allergies  Allergen Reactions  . Hydrochlorothiazide Other (See Comments)    Reaction:  Dizziness   . Lisinopril Cough  . Other Other (See Comments)    Pt states that she is allergic to multiple BP meds -- does not recall the names.   Reaction:  Dizziness   . Spironolactone Other (See Comments)    Reaction:  Blurred vision and sweating of feet     Outpatient Encounter Medications as of 03/02/2019  Medication Sig  . acetaminophen (TYLENOL) 325 MG tablet Take 650 mg by mouth every 6 (six) hours as needed.  Marland Kitchen aspirin EC 81 MG tablet Take 81 mg by mouth daily.  . bisacodyl (DULCOLAX) 10 MG suppository Place 1 suppository (10 mg total) rectally as needed for moderate constipation.  . busPIRone (BUSPAR) 7.5 MG tablet Take 7.5 mg by mouth 3 (three) times daily.   . cholecalciferol (VITAMIN D3) 25 MCG (1000 UT) tablet Take 1,000 Units by mouth daily.  Marland Kitchen docusate sodium (COLACE) 100 MG capsule Take 1 capsule (100 mg total) by mouth 2  (two) times daily.  . Ensure (ENSURE) Take 237 mLs by mouth 3 (three) times daily between meals.  . Melatonin 3 MG TABS Take 3 mg by mouth at bedtime.  . Multiple Vitamin (MULTIVITAMIN) tablet Take 1 tablet by mouth daily.  . nitroGLYCERIN (NITROSTAT) 0.4 MG SL tablet Place 1 tablet under the tongue as directed. Place 1 tablet under the tongue every five minutes as needed for emergency chest pain  . NON FORMULARY Diet Type:  Regular  . pantoprazole (PROTONIX) 40 MG tablet Take 40 mg by mouth daily.  . polyethylene  glycol (MIRALAX / GLYCOLAX) 17 g packet Take 17 g by mouth daily.  . QUEtiapine (SEROQUEL) 25 MG tablet Take 25 mg by mouth at bedtime.  Marland Kitchen. QUEtiapine (SEROQUEL) 25 MG tablet Take 12.5 mg by mouth daily.  . rotigotine (NEUPRO) 4 MG/24HR Place 1 patch onto the skin daily.  . timolol (BETIMOL) 0.5 % ophthalmic solution Place 1 drop into both eyes 2 (two) times daily.   No facility-administered encounter medications on file as of 03/02/2019.      Review of Systems:  Review of Systems  Unable to perform ROS: Dementia (unable to participate )    Physical Exam: Vitals:   03/02/19 1440  BP: 120/62  Pulse: 60  Resp: 16  Temp: (!) 97.5 F (36.4 C)  TempSrc: Oral  SpO2: 98%  Weight: 94 lb (42.6 kg)  Height: 4\' 8"  (1.422 m)   Body mass index is 21.07 kg/m. Physical Exam Constitutional:      General: She is not in acute distress.    Appearance: She is well-developed. She is cachectic. She is not diaphoretic.  Eyes:     Comments: History of cataract surgery   Neck:     Musculoskeletal: Neck supple.     Thyroid: No thyromegaly.  Cardiovascular:     Rate and Rhythm: Normal rate and regular rhythm.     Pulses: Normal pulses.     Heart sounds: Normal heart sounds.  Pulmonary:     Effort: Pulmonary effort is normal. No respiratory distress.     Breath sounds: Normal breath sounds.  Abdominal:     General: Bowel sounds are normal. There is no distension.     Palpations:  Abdomen is soft.     Tenderness: There is no abdominal tenderness.  Musculoskeletal:     Right lower leg: No edema.     Left lower leg: No edema.     Comments: Is able to move all extremities Uses wheelchair Has stiffness present; has bilateral tremor present     Lymphadenopathy:     Cervical: No cervical adenopathy.  Skin:    General: Skin is warm and dry.  Neurological:     Mental Status: She is alert. Mental status is at baseline.  Psychiatric:        Mood and Affect: Mood normal.       SIGNIFICANT DIAGNOSTIC EXAMS   LABS REVIEWED PREVIOUS:   04-05-18: wbc 9.1; hgb 14.7; hc6 46.9; mcv 96.;3 plt 272  glucose 98; bun 24; creat 0.68 k+ 4.7; na++ 139 ca 9.5 liver normal albumin 3.9;  09-02-18: glucose 114; bun 26; creat 0.65; k+ 4.0; na++ 139; ca 8.3; liver normal albumin 3.7    TODAY;   01-16-19: wbc 10.0; hgb 14.7; hct 45.1; mcv 99.1 plt 275; glucose 104; bun 23; creat 0.82; k+ 4.2; na++ 139; ca 9.3 liver normal albumin 3.7        Assessment/Plan  TODAY;   1. Parkinson disease 2.  Psychosis in elderly with behavioral disturbance 3. Dementia due to parkinson disease with behavioral disturbance  Will continue current medications; will continue current plan of care Will continue to monitor her status.       Synthia Innocenteborah Green NP [facility]  Contact [phone] Monday through Friday 8am- 5pm  After hours call [phone]

## 2019-03-17 ENCOUNTER — Non-Acute Institutional Stay (SKILLED_NURSING_FACILITY): Payer: Medicare Other | Admitting: Adult Health

## 2019-03-17 DIAGNOSIS — G20 Parkinson's disease: Secondary | ICD-10-CM

## 2019-03-17 DIAGNOSIS — F0281 Dementia in other diseases classified elsewhere with behavioral disturbance: Secondary | ICD-10-CM | POA: Diagnosis not present

## 2019-03-17 DIAGNOSIS — K5909 Other constipation: Secondary | ICD-10-CM | POA: Diagnosis not present

## 2019-03-17 DIAGNOSIS — H402234 Chronic angle-closure glaucoma, bilateral, indeterminate stage: Secondary | ICD-10-CM | POA: Diagnosis not present

## 2019-03-19 ENCOUNTER — Other Ambulatory Visit (HOSPITAL_COMMUNITY)
Admission: RE | Admit: 2019-03-19 | Discharge: 2019-03-19 | Disposition: A | Discharge status: Home / Self Care | Payer: Medicare Other | Source: Ambulatory Visit | Attending: Internal Medicine | Admitting: Internal Medicine

## 2019-03-19 ENCOUNTER — Other Ambulatory Visit: Payer: Self-pay | Admitting: Internal Medicine

## 2019-03-19 DIAGNOSIS — Z20828 Contact with and (suspected) exposure to other viral communicable diseases: Secondary | ICD-10-CM | POA: Insufficient documentation

## 2019-03-19 DIAGNOSIS — Z9189 Other specified personal risk factors, not elsewhere classified: Secondary | ICD-10-CM | POA: Insufficient documentation

## 2019-03-24 LAB — SARS CORONAVIRUS 2 (TAT 6-24 HRS): SARS Coronavirus 2: NEGATIVE

## 2019-03-25 ENCOUNTER — Other Ambulatory Visit (HOSPITAL_COMMUNITY)
Admission: RE | Admit: 2019-03-25 | Discharge: 2019-03-25 | Disposition: A | Discharge status: Home / Self Care | Payer: Medicare Other | Source: Ambulatory Visit | Attending: Internal Medicine | Admitting: Internal Medicine

## 2019-03-25 ENCOUNTER — Other Ambulatory Visit: Payer: Self-pay | Admitting: Internal Medicine

## 2019-03-25 DIAGNOSIS — Z9189 Other specified personal risk factors, not elsewhere classified: Secondary | ICD-10-CM

## 2019-03-25 DIAGNOSIS — Z20828 Contact with and (suspected) exposure to other viral communicable diseases: Secondary | ICD-10-CM | POA: Diagnosis present

## 2019-03-25 NOTE — Progress Notes (Signed)
[facility] Room Number: 106 Place of Service:  SNF (31)   CODE STATUS: dnr  Allergies  Allergen Reactions  . Hydrochlorothiazide Other (See Comments)    Reaction:  Dizziness   . Lisinopril Cough  . Other Other (See Comments)    Pt states that she is allergic to multiple BP meds -- does not recall the names.   Reaction:  Dizziness   . Spironolactone Other (See Comments)    Reaction:  Blurred vision and sweating of feet     Chief Complaint  Patient presents with  . Medical Management of Chronic Issues        Bilateral chronic primary angle closure glaucoma indeterminate age:  Marland Kitchen. Dementia due to parkinson's disease with behavioral disturbance:  Chronic constipation:    HPI:  She is a 85 year old long term resident of this facilitybeing seen for the management of her chronic illnesses: glaucoma; dementia; constipation. There are no reports of uncontrolled pain; no reports of agitation or insomnia she has a good appetite.   Past Medical History:  Diagnosis Date  . Arthritis    osteoporosis  . Back pain   . Chronic obstructive pulmonary disease (COPD) (HCC)   . COPD (chronic obstructive pulmonary disease) (HCC)   . Dementia (HCC)   . Gait instability   . Glaucoma   . Glaucoma   . Hypertension   . Neck pain   . Parkinson's disease (HCC)   . Tremor     Past Surgical History:  Procedure Laterality Date  . CATARACT EXTRACTION    . EYE SURGERY    . TONSILLECTOMY    . TUBAL LIGATION      Social History   Socioeconomic History  . Marital status: Married    Spouse name: Normand Sloopheodore  . Number of children: 5  . Years of education: 3  . Highest education level: Not on file  Occupational History  . Occupation: retired   Tobacco Use  . Smoking status: Never Smoker  . Smokeless tobacco: Never Used  Substance and Sexual Activity  . Alcohol use: No  . Drug use: No  . Sexual activity: Not Currently  Other Topics Concern  . Not on file   Social History Narrative   [facility]    Social Determinants of Health   Financial Resource Strain: Low Risk   . Difficulty of Paying Living Expenses: Not hard at all  Food Insecurity: No Food Insecurity  . Worried About Programme researcher, broadcasting/film/videounning Out of Food in the Last Year: Never true  . Ran Out of Food in the Last Year: Never true  Transportation Needs: No Transportation Needs  . Lack of Transportation (Medical): No  . Lack of Transportation (Non-Medical): No  Physical Activity: Inactive  . Days of Exercise per Week: 0 days  . Minutes of Exercise per Session: 0 min  Stress: Unknown  . Feeling of Stress : Patient refused  Social Connections: Unknown  . Frequency of Communication with Friends and Family: Patient refused  . Frequency of Social Gatherings with Friends and Family: Patient refused  . Attends Religious Services: Patient refused  . Active Member of Clubs or Organizations: Patient refused  . Attends BankerClub or Organization Meetings: Patient refused  . Marital Status: Patient refused  Intimate Partner Violence: Unknown  . Fear of Current or Ex-Partner: Patient refused  . Emotionally Abused: Patient refused  . Physically Abused: Patient refused  . Sexually Abused: Patient refused  Family History  Problem Relation Age of Onset  . Heart failure Mother       VITAL SIGNS BP 120/65   Pulse 72   Temp 97.6 F (36.4 C)   Ht 4\' 8"  (1.422 m)   Wt 94 lb (42.6 kg)   BMI 21.07 kg/m   Outpatient Encounter Medications as of 03/17/2019  Medication Sig  . acetaminophen (TYLENOL) 325 MG tablet Take 650 mg by mouth every 6 (six) hours as needed.  14/07/2018 aspirin EC 81 MG tablet Take 81 mg by mouth daily.  . bisacodyl (DULCOLAX) 10 MG suppository Place 1 suppository (10 mg total) rectally as needed for moderate constipation.  . busPIRone (BUSPAR) 7.5 MG tablet Take 7.5 mg by mouth 3 (three) times daily.   . cholecalciferol (VITAMIN D3) 25 MCG (1000 UT) tablet Take 1,000 Units by mouth  daily.  Marland Kitchen docusate sodium (COLACE) 100 MG capsule Take 1 capsule (100 mg total) by mouth 2 (two) times daily.  . Ensure (ENSURE) Take 237 mLs by mouth 3 (three) times daily between meals.  . Melatonin 3 MG TABS Take 3 mg by mouth at bedtime.  . Multiple Vitamin (MULTIVITAMIN) tablet Take 1 tablet by mouth daily.  . nitroGLYCERIN (NITROSTAT) 0.4 MG SL tablet Place 1 tablet under the tongue as directed. Place 1 tablet under the tongue every five minutes as needed for emergency chest pain  . NON FORMULARY Diet Type:  Regular  . pantoprazole (PROTONIX) 40 MG tablet Take 40 mg by mouth daily.  . polyethylene glycol (MIRALAX / GLYCOLAX) 17 g packet Take 17 g by mouth daily.  . QUEtiapine (SEROQUEL) 25 MG tablet Take 25 mg by mouth at bedtime.  Marland Kitchen QUEtiapine (SEROQUEL) 25 MG tablet Take 12.5 mg by mouth daily.  . rotigotine (NEUPRO) 4 MG/24HR Place 1 patch onto the skin daily.  . timolol (BETIMOL) 0.5 % ophthalmic solution Place 1 drop into both eyes 2 (two) times daily.   No facility-administered encounter medications on file as of 03/17/2019.     SIGNIFICANT DIAGNOSTIC EXAMS   LABS REVIEWED PREVIOUS:   04-05-18: wbc 9.1; hgb 14.7; hc6 46.9; mcv 96.;3 plt 272  glucose 98; bun 24; creat 0.68 k+ 4.7; na++ 139 ca 9.5 liver normal albumin 3.9;  09-02-18: glucose 114; bun 26; creat 0.65; k+ 4.0; na++ 139; ca 8.3; liver normal albumin 3.7    TODAY;   01-16-19: wbc 10.0; hgb 14.7; hct 45.1; mcv 99.1 plt 275; glucose 104; bun 23; creat 0.82; k+ 4.2; na++ 139; ca 9.3; liver normal albumin 3.7   Review of Systems  Unable to perform ROS: Dementia (unable to participate )     Physical Exam Constitutional:      General: She is not in acute distress.    Appearance: She is cachectic. She is not diaphoretic.  Eyes:     Comments: History of cataract surgery   Neck:     Thyroid: No thyromegaly.  Cardiovascular:     Rate and Rhythm: Normal rate and regular rhythm.     Pulses: Normal pulses.     Heart  sounds: Normal heart sounds.  Pulmonary:     Effort: Pulmonary effort is normal. No respiratory distress.     Breath sounds: Normal breath sounds.  Abdominal:     General: Bowel sounds are normal. There is no distension.     Palpations: Abdomen is soft.     Tenderness: There is no abdominal tenderness.  Musculoskeletal:     Cervical back: Neck  supple.     Right lower leg: No edema.     Left lower leg: No edema.     Comments: Is able to move all extremities Uses wheelchair Has stiffness present; has bilateral tremor present    Lymphadenopathy:     Cervical: No cervical adenopathy.  Skin:    General: Skin is warm and dry.  Neurological:     Mental Status: She is alert. Mental status is at baseline.  Psychiatric:        Mood and Affect: Mood normal.     ASSESSMENT/ PLAN:  TODAY:   1. Bilateral chronic primary angle closure glaucoma indeterminate age: is stable will continue timolol to both eyes twice daily   2. Dementia due to parkinson's disease with behavioral disturbance: no significant change in status weight is 94 pounds will monitor   3. Chronic constipation: is stable will continue colace twice daily and will monitor   PREVIOUS     7. Age related osteoporosis without current pathological fracture: is without change; will continue to monitor her status. Is off prolia  8. Parkinson's disease: is without change; will continue neupro 5 mcg patch daily  9. 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 Will continue to monitor her status.   10. Chronic anxiety: is without change: will continue buspar 7.5 mg three times daily. At this time increasing this medication will not provider her with any benefit.   11. GERD without esophagitis: is stable will continue protonix 40 mg daily   1. Essential hypertension; is stable b/p 120/65 will continue asa 81 mg daily    2. Simple chronic bronchitis: is stable will continue to monitor her status.   3. Chronic low back pain without sciatica unspecified back pain laterality: is stable will continue to monitor her status.       MD is aware of resident's narcotic use and is in agreement with current plan of care. We will attempt to wean resident as appropriate.  Ok Edwards NP [facility]  Contact [phone] Monday through Friday 8am- 5pm  After hours call [phone]

## 2019-03-26 LAB — SARS CORONAVIRUS 2 (TAT 6-24 HRS): SARS Coronavirus 2: NEGATIVE

## 2019-03-31 LAB — NOVEL CORONAVIRUS, NAA (HOSP ORDER, SEND-OUT TO REF LAB; TAT 18-24 HRS): SARS-CoV-2, NAA: NOT DETECTED

## 2019-04-03 ENCOUNTER — Encounter: Payer: Self-pay | Admitting: Adult Health

## 2019-04-03 ENCOUNTER — Non-Acute Institutional Stay (SKILLED_NURSING_FACILITY): Payer: Medicare Other | Admitting: Adult Health

## 2019-04-03 ENCOUNTER — Other Ambulatory Visit (HOSPITAL_COMMUNITY)
Admission: RE | Admit: 2019-04-03 | Discharge: 2019-04-03 | Disposition: A | Discharge status: Home / Self Care | Payer: Medicare Other | Source: Ambulatory Visit | Attending: Internal Medicine | Admitting: Internal Medicine

## 2019-04-03 DIAGNOSIS — Z20828 Contact with and (suspected) exposure to other viral communicable diseases: Secondary | ICD-10-CM | POA: Diagnosis present

## 2019-04-03 DIAGNOSIS — I10 Essential (primary) hypertension: Secondary | ICD-10-CM

## 2019-04-03 DIAGNOSIS — J410 Simple chronic bronchitis: Secondary | ICD-10-CM | POA: Diagnosis not present

## 2019-04-03 NOTE — Progress Notes (Signed)
Location:    [place] Room Number: 105/D Place of Service:  SNF (31)   CODE STATUS: DNR  Allergies  Allergen Reactions  . Hydrochlorothiazide Other (See Comments)    Reaction:  Dizziness   . Lisinopril Cough  . Other Other (See Comments)    Pt states that she is allergic to multiple BP meds -- does not recall the names.   Reaction:  Dizziness   . Spironolactone Other (See Comments)    Reaction:  Blurred vision and sweating of feet     Chief Complaint  Patient presents with  . Acute Visit    Covid Vaccine Permission    HPI:  We will have the mederna vaccine. I have dicussed that this is a 2 injection process; discussed the side effects; and possible severe effects. She is a high risk patient due to advanced age; hypertension and chronic bronchitis. Her family wants her to receive the vaccine. There are no reports of uncontrolled pain; no cough or shortness of breath no reports of agitation or anxiety.   Past Medical History:  Diagnosis Date  . Arthritis    osteoporosis  . Back pain   . Chronic obstructive pulmonary disease (COPD) ([place])   . COPD (chronic obstructive pulmonary disease) ([place])   . Dementia ([place])   . Gait instability   . Glaucoma   . Glaucoma   . Hypertension   . Neck pain   . Parkinson's disease ([place])   . Tremor     Past Surgical History:  Procedure Laterality Date  . CATARACT EXTRACTION    . EYE SURGERY    . TONSILLECTOMY    . TUBAL LIGATION      Social History   Socioeconomic History  . Marital status: Married    Spouse name: Hubbard Robinson  . Number of children: 5  . Years of education: 3  . Highest education level: Not on file  Occupational History  . Occupation: retired   Tobacco Use  . Smoking status: Never Smoker  . Smokeless tobacco: Never Used  Substance and Sexual Activity  . Alcohol use: No  . Drug use: No  . Sexual activity: Not Currently  Other Topics Concern  . Not on file  Social History Narrative    Long term resident of [facility]    Social Determinants of Health   Financial Resource Strain: Low Risk   . Difficulty of Paying Living Expenses: Not hard at all  Food Insecurity: No Food Insecurity  . Worried About Charity fundraiser in the Last Year: Never true  . Ran Out of Food in the Last Year: Never true  Transportation Needs: No Transportation Needs  . Lack of Transportation (Medical): No  . Lack of Transportation (Non-Medical): No  Physical Activity: Inactive  . Days of Exercise per Week: 0 days  . Minutes of Exercise per Session: 0 min  Stress: Unknown  . Feeling of Stress : Patient refused  Social Connections: Unknown  . Frequency of Communication with Friends and Family: Patient refused  . Frequency of Social Gatherings with Friends and Family: Patient refused  . Attends Religious Services: Patient refused  . Active Member of Clubs or Organizations: Patient refused  . Attends Archivist Meetings: Patient refused  . Marital Status: Patient refused  Intimate Partner Violence: Unknown  . Fear of Current or Ex-Partner: Patient refused  . Emotionally Abused: Patient refused  . Physically Abused: Patient refused  . Sexually Abused: Patient refused  Family History  Problem Relation Age of Onset  . Heart failure Mother       VITAL SIGNS BP (!) 142/66   Pulse 64   Temp 98.8 F (37.1 C) (Oral)   Resp 18   Ht 4\' 8"  (1.422 m)   Wt 90 lb (40.8 kg)   SpO2 98%   BMI 20.18 kg/m   Outpatient Encounter Medications as of 04/03/2019  Medication Sig  . acetaminophen (TYLENOL) 325 MG tablet Take 650 mg by mouth every 6 (six) hours as needed.  04/05/2019 aspirin EC 81 MG tablet Take 81 mg by mouth daily.  . bisacodyl (DULCOLAX) 10 MG suppository Place 1 suppository (10 mg total) rectally as needed for moderate constipation.  . busPIRone (BUSPAR) 7.5 MG tablet Take 7.5 mg by mouth 3 (three) times daily.   . cholecalciferol (VITAMIN D3) 25 MCG (1000 UT) tablet Take 1,000 Units  by mouth daily.  Marland Kitchen docusate sodium (COLACE) 100 MG capsule Take 1 capsule (100 mg total) by mouth 2 (two) times daily.  . Ensure (ENSURE) Take 237 mLs by mouth 3 (three) times daily between meals.  . Melatonin 3 MG TABS Take 3 mg by mouth at bedtime.  . Multiple Vitamin (MULTIVITAMIN) tablet Take 1 tablet by mouth daily.  . nitroGLYCERIN (NITROSTAT) 0.4 MG SL tablet Place 1 tablet under the tongue as directed. Place 1 tablet under the tongue every five minutes as needed for emergency chest pain  . NON FORMULARY Diet Type:  Regular  . pantoprazole (PROTONIX) 40 MG tablet Take 40 mg by mouth daily.  . polyethylene glycol (MIRALAX / GLYCOLAX) 17 g packet Take 17 g by mouth daily.  . QUEtiapine (SEROQUEL) 25 MG tablet Take 25 mg by mouth at bedtime.  Marland Kitchen QUEtiapine (SEROQUEL) 25 MG tablet Take 12.5 mg by mouth daily.  . rotigotine (NEUPRO) 4 MG/24HR Place 1 patch onto the skin daily.  . timolol (BETIMOL) 0.5 % ophthalmic solution Place 1 drop into both eyes 2 (two) times daily.  Marland Kitchen zinc sulfate 220 (50 Zn) MG capsule Take 220 mg by mouth daily.   No facility-administered encounter medications on file as of 04/03/2019.     SIGNIFICANT DIAGNOSTIC EXAMS   LABS REVIEWED PREVIOUS:   04-05-18: wbc 9.1; hgb 14.7; hc6 46.9; mcv 96.;3 plt 272  glucose 98; bun 24; creat 0.68 k+ 4.7; na++ 139 ca 9.5 liver normal albumin 3.9;  09-02-18: glucose 114; bun 26; creat 0.65; k+ 4.0; na++ 139; ca 8.3; liver normal albumin 3.7   01-16-19: wbc 10.0; hgb 14.7; hct 45.1; mcv 99.1 plt 275; glucose 104; bun 23; creat 0.82; k+ 4.2; na++ 139; ca 9.3; liver normal albumin 3.7  NO NEW LABS.    Review of Systems  Unable to perform ROS: Dementia (unable to participate )     Physical Exam Constitutional:      General: She is not in acute distress.    Appearance: She is cachectic. She is not diaphoretic.  Eyes:     Comments: History of cataract surgery    Neck:     Thyroid: No thyromegaly.  Cardiovascular:      Rate and Rhythm: Normal rate and regular rhythm.     Pulses: Normal pulses.     Heart sounds: Normal heart sounds.  Pulmonary:     Effort: Pulmonary effort is normal. No respiratory distress.     Breath sounds: Normal breath sounds.  Abdominal:     General: Bowel sounds are normal. There is no distension.  Palpations: Abdomen is soft.     Tenderness: There is no abdominal tenderness.  Musculoskeletal:     Cervical back: Neck supple.     Right lower leg: No edema.     Left lower leg: No edema.     Comments: Is able to move all extremities Uses wheelchair Has stiffness present; has bilateral tremor present     Lymphadenopathy:     Cervical: No cervical adenopathy.  Skin:    General: Skin is warm and dry.  Neurological:     Mental Status: She is alert. Mental status is at baseline.  Psychiatric:        Mood and Affect: Mood normal.       ASSESSMENT/ PLAN:  TODAY  1. Long term resident of SNF 2. Advanced age 89. essenital hypertension 4. Simple chronic bronchitis  Will estu pf vaccine with the first does to be given on 04-11-19.     MD is aware of resident's narcotic use and is in agreement with current plan of care. We will attempt to wean resident as appropriate.  Synthia Innocenteborah Green NP [facility]  Contact [phone] Monday through Friday 8am- 5pm  After hours call [phone]

## 2019-04-05 LAB — NOVEL CORONAVIRUS, NAA (HOSP ORDER, SEND-OUT TO REF LAB; TAT 18-24 HRS): SARS-CoV-2, NAA: NOT DETECTED

## 2019-04-07 ENCOUNTER — Encounter: Payer: Self-pay | Admitting: Adult Health

## 2019-04-09 ENCOUNTER — Other Ambulatory Visit (HOSPITAL_COMMUNITY)
Admission: RE | Admit: 2019-04-09 | Discharge: 2019-04-09 | Disposition: A | Discharge status: Home / Self Care | Payer: Medicare Other | Source: Ambulatory Visit | Attending: Adult Health | Admitting: Adult Health

## 2019-04-10 LAB — NOVEL CORONAVIRUS, NAA (HOSP ORDER, SEND-OUT TO REF LAB; TAT 18-24 HRS): SARS-CoV-2, NAA: NOT DETECTED

## 2019-04-13 ENCOUNTER — Non-Acute Institutional Stay (SKILLED_NURSING_FACILITY): Payer: Medicare Other | Admitting: Adult Health

## 2019-04-13 ENCOUNTER — Encounter: Payer: Self-pay | Admitting: Adult Health

## 2019-04-13 DIAGNOSIS — F0391 Unspecified dementia with behavioral disturbance: Secondary | ICD-10-CM | POA: Diagnosis not present

## 2019-04-13 DIAGNOSIS — F03918 Unspecified dementia, unspecified severity, with other behavioral disturbance: Secondary | ICD-10-CM

## 2019-04-13 DIAGNOSIS — F419 Anxiety disorder, unspecified: Secondary | ICD-10-CM | POA: Diagnosis not present

## 2019-04-13 NOTE — Progress Notes (Signed)
Location:    [facility] Room Number: 105/D Place of Service:  SNF (31)   CODE STATUS: DNR  Allergies  Allergen Reactions  . Hydrochlorothiazide Other (See Comments)    Reaction:  Dizziness   . Lisinopril Cough  . Other Other (See Comments)    Pt states that she is allergic to multiple BP meds -- does not recall the names.   Reaction:  Dizziness   . Spironolactone Other (See Comments)    Reaction:  Blurred vision and sweating of feet     Chief Complaint  Patient presents with  . Acute Visit    Med Management    HPI:  She is presently taking buspar 7.5 mg three times daily seroquel 12.5 mg in the AM and 25 mg in the PM. She is doing well. Has few episodes of yelling out. Her weight is stable. There are no reports of changes in appetite; no uncontrolled pain.   Past Medical History:  Diagnosis Date  . Arthritis    osteoporosis  . Back pain   . Chronic obstructive pulmonary disease (COPD) (HCC)   . COPD (chronic obstructive pulmonary disease) (HCC)   . Dementia (HCC)   . Gait instability   . Glaucoma   . Glaucoma   . Hypertension   . Neck pain   . Parkinson's disease (HCC)   . Rectal bleeding 12/27/2017  . Tremor     Past Surgical History:  Procedure Laterality Date  . CATARACT EXTRACTION    . EYE SURGERY    . TONSILLECTOMY    . TUBAL LIGATION      Social History   Socioeconomic History  . Marital status: Married    Spouse name: Normand Sloopheodore  . Number of children: 5  . Years of education: 3  . Highest education level: Not on file  Occupational History  . Occupation: retired   Tobacco Use  . Smoking status: Never Smoker  . Smokeless tobacco: Never Used  Substance and Sexual Activity  . Alcohol use: No  . Drug use: No  . Sexual activity: Not Currently  Other Topics Concern  . Not on file  Social History Narrative   [facility]    Social Determinants of Health   Financial Resource Strain: Low Risk   . Difficulty  of Paying Living Expenses: Not hard at all  Food Insecurity: No Food Insecurity  . Worried About Programme researcher, broadcasting/film/videounning Out of Food in the Last Year: Never true  . Ran Out of Food in the Last Year: Never true  Transportation Needs: No Transportation Needs  . Lack of Transportation (Medical): No  . Lack of Transportation (Non-Medical): No  Physical Activity: Inactive  . Days of Exercise per Week: 0 days  . Minutes of Exercise per Session: 0 min  Stress: Unknown  . Feeling of Stress : Patient refused  Social Connections: Unknown  . Frequency of Communication with Friends and Family: Patient refused  . Frequency of Social Gatherings with Friends and Family: Patient refused  . Attends Religious Services: Patient refused  . Active Member of Clubs or Organizations: Patient refused  . Attends BankerClub or Organization Meetings: Patient refused  . Marital Status: Patient refused  Intimate Partner Violence: Unknown  . Fear of Current or Ex-Partner: Patient refused  . Emotionally Abused: Patient refused  . Physically Abused: Patient refused  . Sexually Abused: Patient refused   Family History  Problem Relation Age of Onset  . Heart failure Mother  VITAL SIGNS BP (!) 147/74   Pulse 72   Temp (!) 97.5 F (36.4 C) (Oral)   Resp 19   Ht 4\' 8"  (1.422 m)   Wt 92 lb (41.7 kg)   SpO2 98%   BMI 20.63 kg/m   Outpatient Encounter Medications as of 04/13/2019  Medication Sig  . acetaminophen (TYLENOL) 325 MG tablet Take 650 mg by mouth every 6 (six) hours as needed.  Marland Kitchen aspirin EC 81 MG tablet Take 81 mg by mouth daily.  . bisacodyl (DULCOLAX) 10 MG suppository Place 1 suppository (10 mg total) rectally as needed for moderate constipation.  . busPIRone (BUSPAR) 7.5 MG tablet Take 7.5 mg by mouth 3 (three) times daily.   . cholecalciferol (VITAMIN D3) 25 MCG (1000 UT) tablet Take 1,000 Units by mouth daily.  Marland Kitchen docusate sodium (COLACE) 100 MG capsule Take 1 capsule (100 mg total) by mouth 2 (two) times  daily.  . Ensure (ENSURE) Take 237 mLs by mouth 3 (three) times daily between meals.  . Melatonin 3 MG TABS Take 3 mg by mouth at bedtime.  . nitroGLYCERIN (NITROSTAT) 0.4 MG SL tablet Place 1 tablet under the tongue as directed. Place 1 tablet under the tongue every five minutes as needed for emergency chest pain  . NON FORMULARY Diet Type:  Regular  . pantoprazole (PROTONIX) 40 MG tablet Take 40 mg by mouth daily.  . polyethylene glycol (MIRALAX / GLYCOLAX) 17 g packet Take 17 g by mouth daily.  . QUEtiapine (SEROQUEL) 25 MG tablet Take 25 mg by mouth at bedtime.  Marland Kitchen QUEtiapine (SEROQUEL) 25 MG tablet Take 12.5 mg by mouth daily.  . rotigotine (NEUPRO) 4 MG/24HR Place 1 patch onto the skin daily.  . timolol (BETIMOL) 0.5 % ophthalmic solution Place 1 drop into both eyes 2 (two) times daily.  . [DISCONTINUED] Multiple Vitamin (MULTIVITAMIN) tablet Take 1 tablet by mouth daily.   No facility-administered encounter medications on file as of 04/13/2019.     SIGNIFICANT DIAGNOSTIC EXAMS    LABS REVIEWED PREVIOUS:   04-05-18: wbc 9.1; hgb 14.7; hc6 46.9; mcv 96.;3 plt 272  glucose 98; bun 24; creat 0.68 k+ 4.7; na++ 139 ca 9.5 liver normal albumin 3.9;  09-02-18: glucose 114; bun 26; creat 0.65; k+ 4.0; na++ 139; ca 8.3; liver normal albumin 3.7   01-16-19: wbc 10.0; hgb 14.7; hct 45.1; mcv 99.1 plt 275; glucose 104; bun 23; creat 0.82; k+ 4.2; na++ 139; ca 9.3; liver normal albumin 3.7  NO NEW LABS.    Review of Systems  Unable to perform ROS: Dementia (unable to participoate )    Physical Exam Constitutional:      General: She is not in acute distress.    Appearance: She is cachectic. She is not diaphoretic.  Eyes:     Comments: History of cataract surgery    Neck:   Neck:     Thyroid: No thyromegaly.  Cardiovascular:     Rate and Rhythm: Normal rate and regular rhythm.     Heart sounds: Normal heart sounds.  Pulmonary:     Effort: Pulmonary effort is normal. No respiratory  distress.     Breath sounds: Normal breath sounds.  Abdominal:     General: Bowel sounds are normal. There is no distension.     Palpations: Abdomen is soft.     Tenderness: There is no abdominal tenderness.  Musculoskeletal:     Right lower leg: No edema.     Left lower leg:  No edema.     Comments: Is able to move all extremities Uses wheelchair Has stiffness present; has bilateral tremor present      Lymphadenopathy:     Cervical: No cervical adenopathy.  Skin:    General: Skin is warm and dry.  Neurological:     Mental Status: She is alert. Mental status is at baseline.  Psychiatric:        Mood and Affect: Mood normal.       ASSESSMENT/ PLAN:  TODAY  1. Psychosis in elderly with behavioral disturbance 2. Chronic anxiety She is presently stable will continue seroquel 12.5 mg in the AM and 25 mg in the PM will continue buspar 7.5 mg three times daily  Will monitor her status.     MD is aware of resident's narcotic use and is in agreement with current plan of care. We will attempt to wean resident as appropriate.  Synthia Innocent NP [facility]  Contact [phone] Monday through Friday 8am- 5pm  After hours call [phone]

## 2019-04-18 IMAGING — CT CT L SPINE W/O CM
3 series · 9 of 33 positions shown, 11 images · non-contrast
Comparison: Plain film of the lumbar spine dated 05/13/2016

CLINICAL DATA: Status post fall, back pain.

EXAM:
CT THORACIC and LUMBAR SPINE WITHOUT CONTRAST
TECHNIQUE: Multidetector CT imaging of the thoracic and lumbar spine was
performed without intravenous contrast. Multiplanar CT image
reconstructions were also generated.

[Series 4: l spine soft · axial · 0.28mm/px · z∈[+226,+226]mm · 1 of 117 slices shown, 2 images]
[im 63/117  soft-tissue]
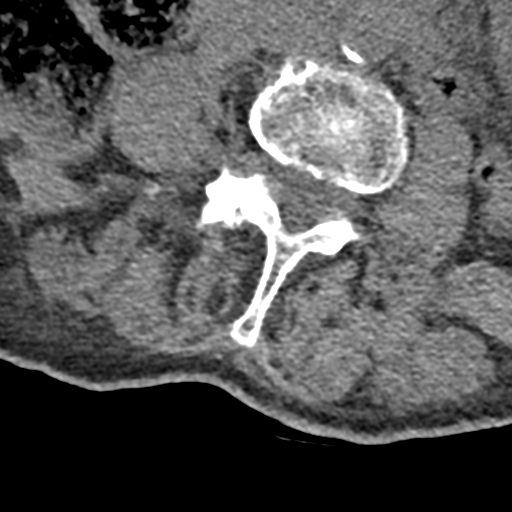
[im 63/117  bone]
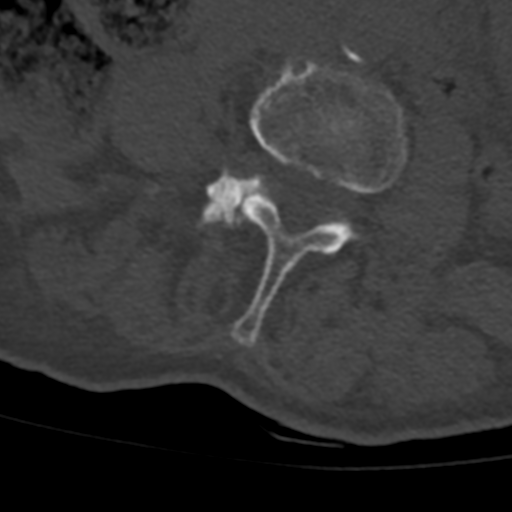

[Series 7: sagittal bone · sagittal · 0.21mm/px · 5 of 61 slices shown, 6 images]
[im 21/61  bone]
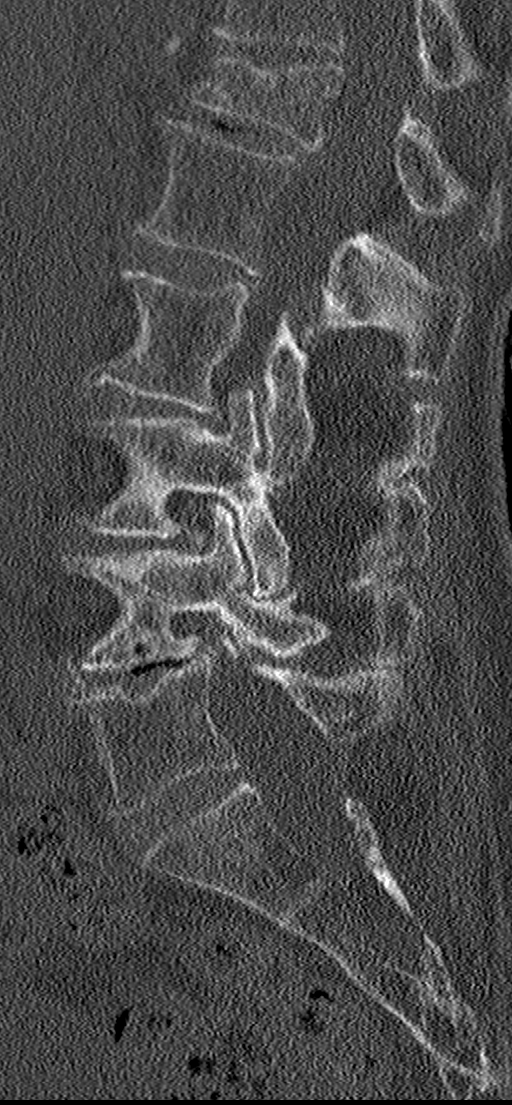
[im 26/61  bone]
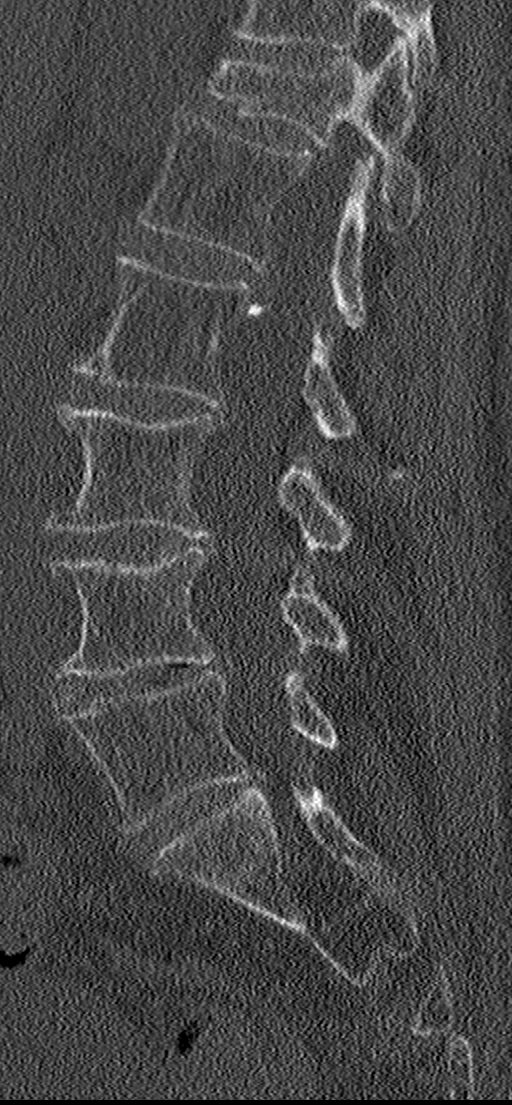
[im 31/61  soft-tissue]
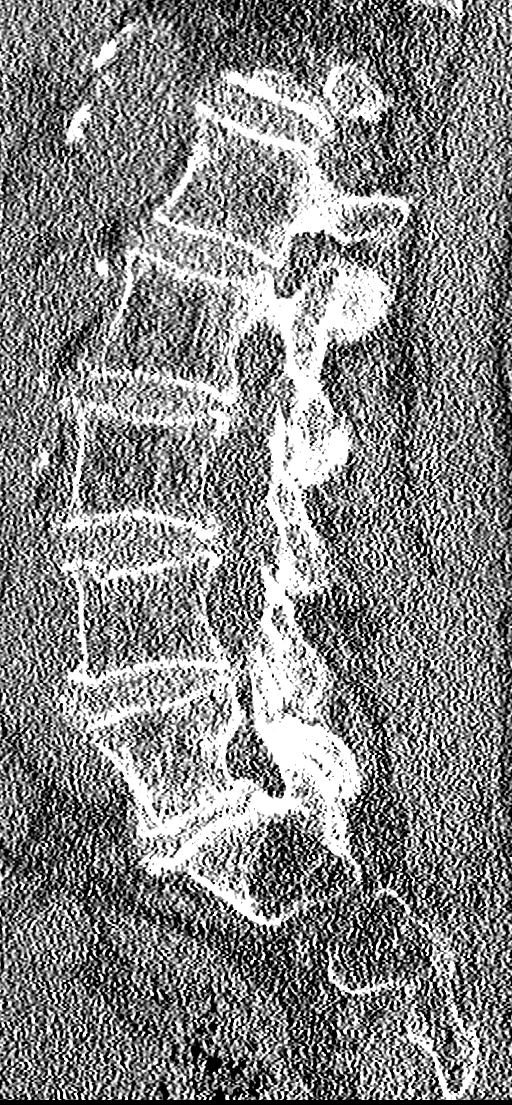
[im 31/61  bone]
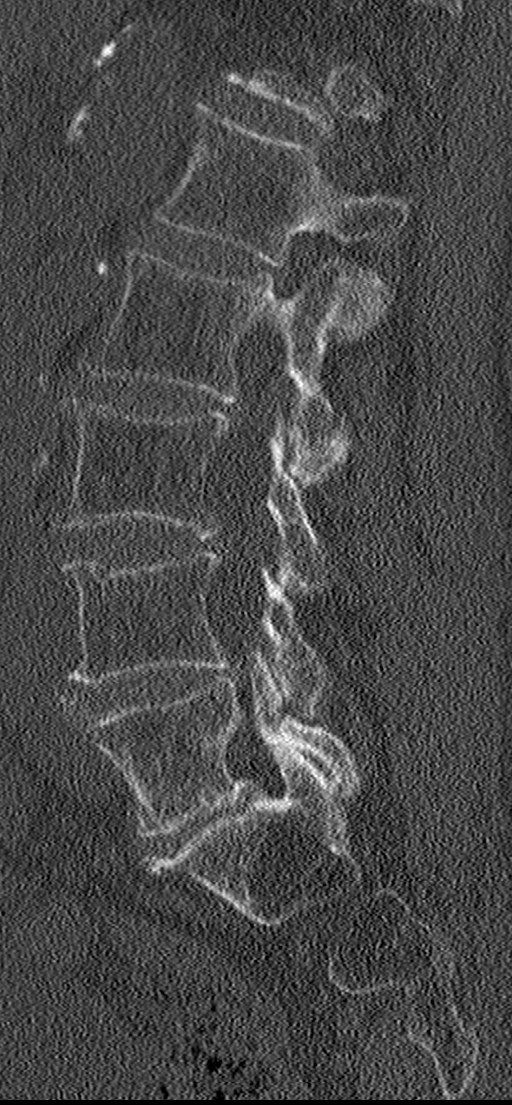
[im 36/61  bone]
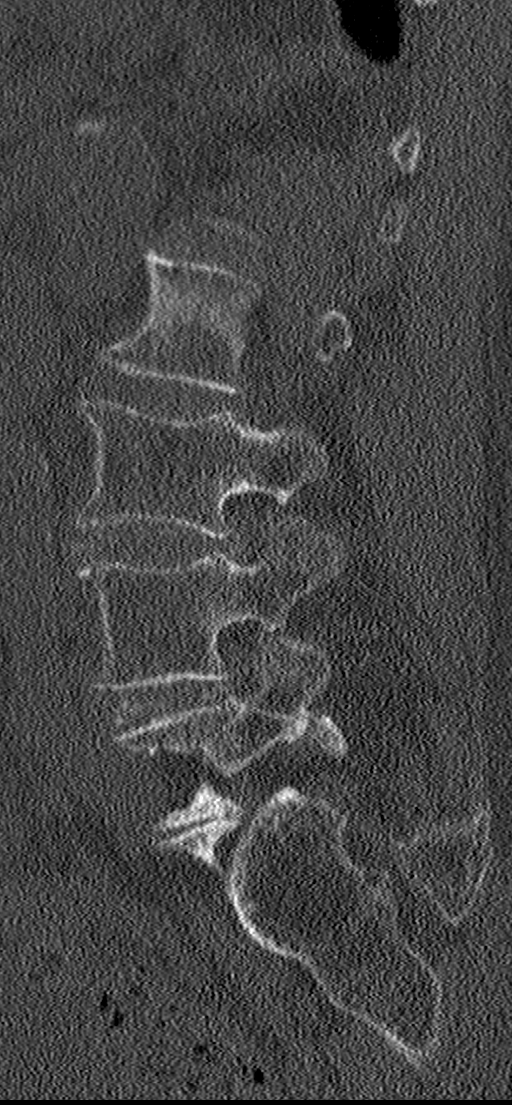
[im 41/61  bone]
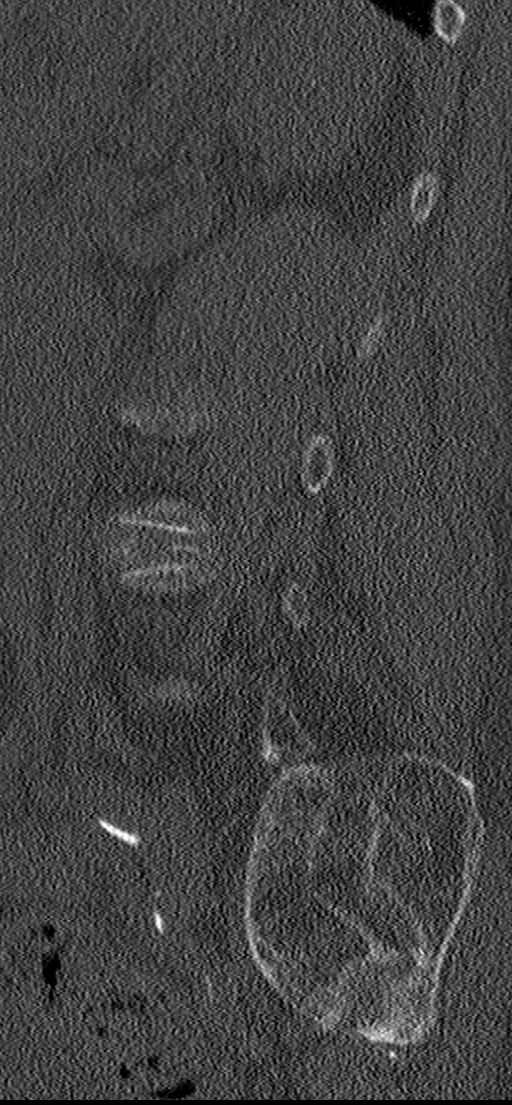

[Series 8: coronal bone · coronal · 0.30mm/px · 3 of 57 slices shown]
[im 12/57  bone]
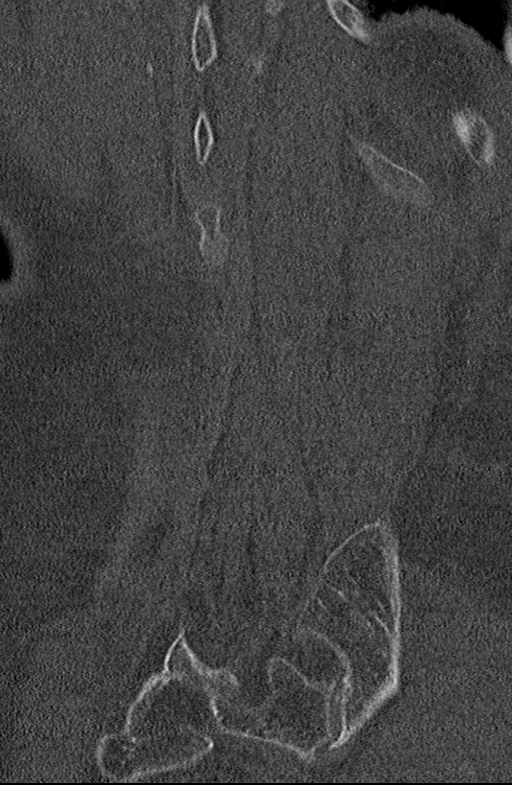
[im 23/57  bone]
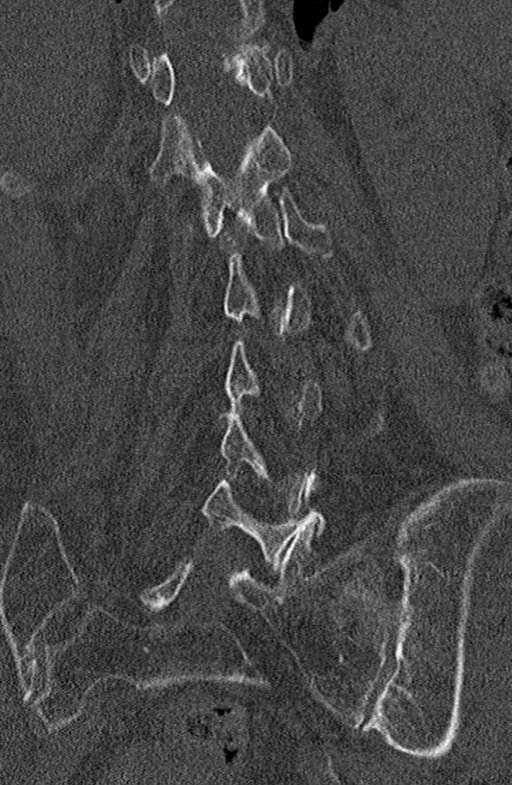
[im 34/57  bone]
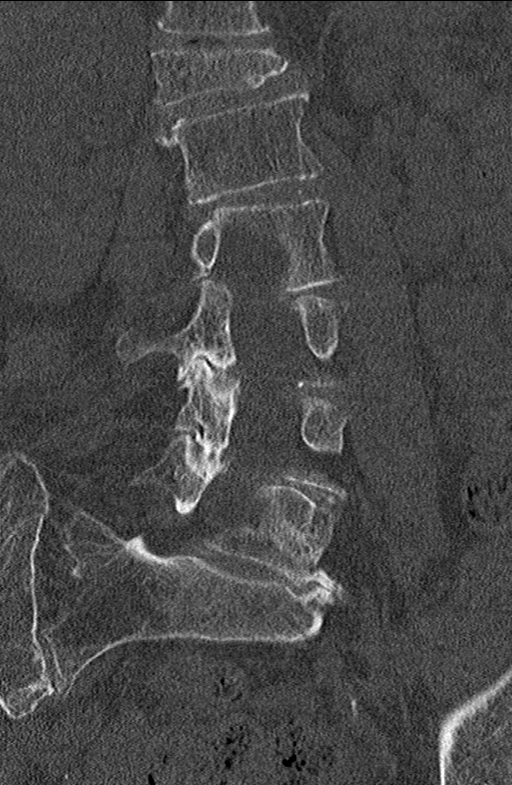

[9 of 33 positions shown; findings below may reference images not displayed]

FINDINGS: CT THORACIC SPINE FINDINGS

Alignment: Prominent dextroscoliosis of the lower thoracic spine. No
evidence of acute vertebral body subluxation.

Vertebrae: No fracture line or displaced fracture fragment. Chronic
compression fracture deformity of the T12 vertebral body.

Paraspinal and other soft tissues: Aortic atherosclerosis.
Cholelithiasis. No acute findings within the visualized
paravertebral soft tissues.

Disc levels: Mild degenerative change within the lower thoracic
spine. No more than mild central canal stenosis at any level.

CT LUMBAR SPINE FINDINGS

Segmentation: 5 lumbar type vertebrae.

Alignment: Prominent levoscoliosis centered at the L3 vertebral body
level. No evidence of acute vertebral body subluxation.

Vertebrae: No fracture line or displaced fracture fragment
identified. Upper sacrum appears intact and normally aligned.

Paraspinal and other soft tissues: Aortic atherosclerosis.
Cholelithiasis. No acute findings seen within the visualized
paravertebral soft tissues.

Disc levels: Disc desiccations throughout the mid and lower cervical
spine, mild to moderate in degree, with associated mild to moderate
central canal stenoses at multiple levels.
IMPRESSION: 1. No evidence of acute fracture or acute subluxation within the
thoracic or lumbar spine.
2. Prominent scoliosis of the thoracic and lumbar spine.
3. Chronic compression deformity of the T12 vertebral body.
4. Mild degenerative change within the thoracic spine. Mild to
moderate degenerative change within the lumbar spine.
5. Aortic atherosclerosis.
6. Cholelithiasis.

## 2019-04-19 DIAGNOSIS — I679 Cerebrovascular disease, unspecified: Secondary | ICD-10-CM | POA: Diagnosis not present

## 2019-04-19 DIAGNOSIS — Z1159 Encounter for screening for other viral diseases: Secondary | ICD-10-CM | POA: Diagnosis not present

## 2019-04-19 DIAGNOSIS — I4821 Permanent atrial fibrillation: Secondary | ICD-10-CM | POA: Diagnosis not present

## 2019-04-19 DIAGNOSIS — Z23 Encounter for immunization: Secondary | ICD-10-CM | POA: Diagnosis not present

## 2019-04-20 DIAGNOSIS — R05 Cough: Secondary | ICD-10-CM | POA: Diagnosis not present

## 2019-04-21 ENCOUNTER — Encounter: Payer: Self-pay | Admitting: Adult Health

## 2019-04-21 ENCOUNTER — Non-Acute Institutional Stay (SKILLED_NURSING_FACILITY): Payer: Medicare Other | Admitting: Adult Health

## 2019-04-21 DIAGNOSIS — G20 Parkinson's disease: Secondary | ICD-10-CM | POA: Diagnosis not present

## 2019-04-21 DIAGNOSIS — M810 Age-related osteoporosis without current pathological fracture: Secondary | ICD-10-CM

## 2019-04-21 DIAGNOSIS — F0281 Dementia in other diseases classified elsewhere with behavioral disturbance: Secondary | ICD-10-CM

## 2019-04-21 NOTE — Progress Notes (Signed)
Location:    [facility] Room Number: 105/D Place of Service:  SNF (31)   CODE STATUS: DNR  Allergies  Allergen Reactions  . Hydrochlorothiazide Other (See Comments)    Reaction:  Dizziness   . Lisinopril Cough  . Other Other (See Comments)    Pt states that she is allergic to multiple BP meds -- does not recall the names.   Reaction:  Dizziness   . Spironolactone Other (See Comments)    Reaction:  Blurred vision and sweating of feet     Chief Complaint  Patient presents with  . 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 There are no reports of agitation; no reports of uncontrolled pain; no reports of insomnia.   Past Medical History:  Diagnosis Date  . Arthritis    osteoporosis  . Back pain   . Chronic obstructive pulmonary disease (COPD) (HCC)   . COPD (chronic obstructive pulmonary disease) (HCC)   . Dementia (HCC)   . Gait instability   . Glaucoma   . Glaucoma   . Hypertension   . Neck pain   . Parkinson's disease (HCC)   . Rectal bleeding 12/27/2017  . Tremor     Past Surgical History:  Procedure Laterality Date  . CATARACT EXTRACTION    . EYE SURGERY    . TONSILLECTOMY    . TUBAL LIGATION      Social History   Socioeconomic History  . Marital status: Married    Spouse name: Normand Sloop  . Number of children: 5  . Years of education: 3  . Highest education level: Not on file  Occupational History  . Occupation: retired   Tobacco Use  . Smoking status: Never Smoker  . Smokeless tobacco: Never Used  Substance and Sexual Activity  . Alcohol use: No  . Drug use: No  . Sexual activity: Not Currently  Other Topics Concern   . Not on file  Social History Narrative   Long term resident of [facility]    Social Determinants of Health   Financial Resource Strain: Low Risk   . Difficulty of Paying Living Expenses: Not hard at all  Food Insecurity: No Food Insecurity  . Worried About Programme researcher, broadcasting/film/video in the Last Year: Never true  . Ran Out of Food in the Last Year: Never true  Transportation Needs: No Transportation Needs  . Lack of Transportation (Medical): No  . Lack of Transportation (Non-Medical): No  Physical Activity: Inactive  . Days of Exercise per Week: 0 days  . Minutes of Exercise per Session: 0 min  Stress: Unknown  . Feeling of Stress : Patient refused  Social Connections: Unknown  . Frequency of Communication with Friends and Family: Patient refused  . Frequency of Social Gatherings with Friends and Family: Patient refused  . Attends Religious Services: Patient refused  . Active Member of Clubs or Organizations: Patient refused  . Attends Banker Meetings: Patient refused  . Marital Status: Patient refused  Intimate Partner Violence: Unknown  . Fear of Current or Ex-Partner: Patient refused  . Emotionally Abused: Patient refused  . Physically Abused: Patient refused  . Sexually Abused: Patient  refused   Family History  Problem Relation Age of Onset  . Heart failure Mother       VITAL SIGNS BP (!) 147/74   Pulse 72   Temp 98.1 F (36.7 C) (Oral)   Resp 19   Ht 4\' 8"  (1.422 m)   Wt 89 lb 12.8 oz (40.7 kg)   SpO2 98%   BMI 20.13 kg/m   Outpatient Encounter Medications as of 04/21/2019  Medication Sig  . acetaminophen (TYLENOL) 325 MG tablet Take 650 mg by mouth every 6 (six) hours as needed.  Marland Kitchen aspirin EC 81 MG tablet Take 81 mg by mouth daily.  . bisacodyl (DULCOLAX) 10 MG suppository Place 1 suppository (10 mg total) rectally as needed for moderate constipation.  . busPIRone (BUSPAR) 7.5 MG tablet Take 7.5 mg by mouth 3 (three) times daily.   . cholecalciferol  (VITAMIN D3) 25 MCG (1000 UT) tablet Take 1,000 Units by mouth daily.  Marland Kitchen docusate sodium (COLACE) 100 MG capsule Take 1 capsule (100 mg total) by mouth 2 (two) times daily.  . Ensure (ENSURE) Take 237 mLs by mouth 3 (three) times daily between meals.  . Melatonin 3 MG TABS Take 3 mg by mouth at bedtime.  . nitroGLYCERIN (NITROSTAT) 0.4 MG SL tablet Place 1 tablet under the tongue as directed. Place 1 tablet under the tongue every five minutes as needed for emergency chest pain  . NON FORMULARY Diet Type:  Regular  . pantoprazole (PROTONIX) 40 MG tablet Take 40 mg by mouth daily.  . polyethylene glycol (MIRALAX / GLYCOLAX) 17 g packet Take 17 g by mouth daily.  . QUEtiapine (SEROQUEL) 25 MG tablet Take 25 mg by mouth at bedtime.  Marland Kitchen QUEtiapine (SEROQUEL) 25 MG tablet Take 12.5 mg by mouth daily.  . rotigotine (NEUPRO) 4 MG/24HR Place 1 patch onto the skin daily.  . timolol (BETIMOL) 0.5 % ophthalmic solution Place 1 drop into both eyes 2 (two) times daily.   No facility-administered encounter medications on file as of 04/21/2019.     SIGNIFICANT DIAGNOSTIC EXAMS   LABS REVIEWED PREVIOUS:    09-02-18: glucose 114; bun 26; creat 0.65; k+ 4.0; na++ 139; ca 8.3; liver normal albumin 3.7   01-16-19: wbc 10.0; hgb 14.7; hct 45.1; mcv 99.1 plt 275; glucose 104; bun 23; creat 0.82; k+ 4.2; na++ 139; ca 9.3; liver normal albumin 3.7   NO NEW LABS.   Review of Systems  Unable to perform ROS: Dementia (unable to participate )    Physical Exam Constitutional:      General: She is not in acute distress.    Appearance: She is cachectic. She is ill-appearing. She is not diaphoretic.  Eyes:     Comments: History of cataract surgery     Neck:     Thyroid: No thyromegaly.  Cardiovascular:     Rate and Rhythm: Normal rate and regular rhythm.     Pulses: Normal pulses.     Heart sounds: Normal heart sounds.  Pulmonary:     Effort: Pulmonary effort is normal. No respiratory distress.     Breath  sounds: Normal breath sounds.  Abdominal:     General: Bowel sounds are normal. There is no distension.     Palpations: Abdomen is soft.     Tenderness: There is no abdominal tenderness.  Musculoskeletal:     Cervical back: Neck supple.     Right lower leg: No edema.     Left lower leg: No edema.  Comments:  Is able to move all extremities Uses wheelchair Has stiffness present; has bilateral tremor present       Lymphadenopathy:     Cervical: No cervical adenopathy.  Skin:    General: Skin is warm and dry.  Neurological:     Mental Status: She is alert. Mental status is at baseline.  Psychiatric:        Mood and Affect: Mood normal.      ASSESSMENT/ PLAN:  TODAY:   1. Age related osteoporosis without current pathological fracture: is without change is off prolia  2. parkinson's disease is without change will continue neupro 5 mcg patch daily   3. 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 Will monitor   PREVIOUS   4. Chronic anxiety: is without change: will continue buspar 7.5 mg three times daily. At this time increasing this medication will not provider her with any benefit.   5. GERD without esophagitis: is stable will continue protonix 40 mg daily   6. Essential hypertension; is stable b/p 147/74 will continue asa 81 mg daily   7. Simple chronic bronchitis: is stable will continue to monitor her status.   8. Chronic low back pain without sciatica unspecified back pain laterality: is stable will continue to monitor her status.   9. Bilateral chronic primary angle closure glaucoma indeterminate age: is stable will continue timolol to both eyes twice daily   10. Dementia due to parkinson's disease with behavioral disturbance: no significant change in status weight is 89 pounds will monitor  Weight loss is an unfortunate outcome at the late stages  of this disease process.   11. Chronic constipation: is stable will continue colace twice daily and will monitor         MD is aware of resident's narcotic use and is in agreement with current plan of care. We will attempt to wean resident as appropriate.  Synthia Innocent NP [facility]  Contact [phone] Monday through Friday 8am- 5pm  After hours call [phone]

## 2019-04-27 DIAGNOSIS — Z1159 Encounter for screening for other viral diseases: Secondary | ICD-10-CM | POA: Diagnosis not present

## 2019-04-27 DIAGNOSIS — G20 Parkinson's disease: Secondary | ICD-10-CM | POA: Diagnosis not present

## 2019-05-01 DIAGNOSIS — F411 Generalized anxiety disorder: Secondary | ICD-10-CM | POA: Diagnosis not present

## 2019-05-04 DIAGNOSIS — G20 Parkinson's disease: Secondary | ICD-10-CM | POA: Diagnosis not present

## 2019-05-04 DIAGNOSIS — Z1159 Encounter for screening for other viral diseases: Secondary | ICD-10-CM | POA: Diagnosis not present

## 2019-05-11 DIAGNOSIS — G20 Parkinson's disease: Secondary | ICD-10-CM | POA: Diagnosis not present

## 2019-05-11 DIAGNOSIS — Z1159 Encounter for screening for other viral diseases: Secondary | ICD-10-CM | POA: Diagnosis not present

## 2019-05-17 ENCOUNTER — Encounter: Payer: Self-pay | Admitting: Adult Health

## 2019-05-17 ENCOUNTER — Non-Acute Institutional Stay (SKILLED_NURSING_FACILITY): Payer: Medicare Other | Admitting: Adult Health

## 2019-05-17 DIAGNOSIS — G20 Parkinson's disease: Secondary | ICD-10-CM

## 2019-05-17 DIAGNOSIS — F0281 Dementia in other diseases classified elsewhere with behavioral disturbance: Secondary | ICD-10-CM

## 2019-05-17 DIAGNOSIS — F0391 Unspecified dementia with behavioral disturbance: Secondary | ICD-10-CM

## 2019-05-17 DIAGNOSIS — F03918 Unspecified dementia, unspecified severity, with other behavioral disturbance: Secondary | ICD-10-CM

## 2019-05-17 DIAGNOSIS — Z23 Encounter for immunization: Secondary | ICD-10-CM | POA: Diagnosis not present

## 2019-05-17 NOTE — Progress Notes (Signed)
Location:    [facility] Room Number: 105D Place of Service:  SNF (31) Carleene Overlie NP    CODE STATUS: DNR  Allergies  Allergen Reactions  . Hydrochlorothiazide Other (See Comments)    Reaction:  Dizziness   . Lisinopril Cough  . Other Other (See Comments)    Pt states that she is allergic to multiple BP meds -- does not recall the names.   Reaction:  Dizziness   . Spironolactone Other (See Comments)    Reaction:  Blurred vision and sweating of feet     Chief Complaint  Patient presents with  . Acute Visit    Care Plan Meeting    HPI:  We have come together for her care plan meeting. Unable to do bims or mood. Her weight is stable. She has had one fall. She has fewer episodes of yelling out. She does get physically aggressive with nursing care. There are no reports of pain present. She continues to be followed for her chronic illnesses including: parkinson; dementia; psychosis   Past Medical History:  Diagnosis Date  . Arthritis    osteoporosis  . Back pain   . Chronic obstructive pulmonary disease (COPD) (HCC)   . COPD (chronic obstructive pulmonary disease) (HCC)   . Dementia (HCC)   . Gait instability   . Glaucoma   . Glaucoma   . Hypertension   . Neck pain   . Parkinson's disease (HCC)   . Rectal bleeding 12/27/2017  . Tremor     Past Surgical History:  Procedure Laterality Date  . CATARACT EXTRACTION    . EYE SURGERY    . TONSILLECTOMY    . TUBAL LIGATION      Social History   Socioeconomic History  . Marital status: Married    Spouse name: Normand Sloop  . Number of children: 5  . Years of education: 3  . Highest education level: Not on file  Occupational History  . Occupation: retired   Tobacco Use  . Smoking status: Never Smoker  . Smokeless tobacco: Never Used  Substance and Sexual Activity  . Alcohol use: No  . Drug use: No  . Sexual activity: Not Currently  Other Topics Concern  . Not on file  Social History  Narrative   Long term resident of [facility]    Social Determinants of Health   Financial Resource Strain: Low Risk   . Difficulty of Paying Living Expenses: Not hard at all  Food Insecurity: No Food Insecurity  . Worried About Programme researcher, broadcasting/film/video in the Last Year: Never true  . Ran Out of Food in the Last Year: Never true  Transportation Needs: No Transportation Needs  . Lack of Transportation (Medical): No  . Lack of Transportation (Non-Medical): No  Physical Activity: Inactive  . Days of Exercise per Week: 0 days  . Minutes of Exercise per Session: 0 min  Stress: Unknown  . Feeling of Stress : Patient refused  Social Connections: Unknown  . Frequency of Communication with Friends and Family: Patient refused  . Frequency of Social Gatherings with Friends and Family: Patient refused  . Attends Religious Services: Patient refused  . Active Member of Clubs or Organizations: Patient refused  . Attends Banker Meetings: Patient refused  . Marital Status: Patient refused  Intimate Partner Violence: Unknown  . Fear of Current or Ex-Partner: Patient refused  . Emotionally Abused: Patient refused  . Physically Abused: Patient refused  . Sexually Abused:  Patient refused   Family History  Problem Relation Age of Onset  . Heart failure Mother       VITAL SIGNS BP (!) 152/78   Pulse 73   Temp 98.4 F (36.9 C) (Oral)   Resp 18   Ht 4\' 8"  (1.422 m)   Wt 90 lb (40.8 kg)   SpO2 98%   BMI 20.18 kg/m   Outpatient Encounter Medications as of 05/17/2019  Medication Sig  . acetaminophen (TYLENOL) 325 MG tablet Take 650 mg by mouth every 6 (six) hours as needed.  Marland Kitchen aspirin EC 81 MG tablet Take 81 mg by mouth daily.  . bisacodyl (DULCOLAX) 10 MG suppository Place 1 suppository (10 mg total) rectally as needed for moderate constipation.  . busPIRone (BUSPAR) 7.5 MG tablet Take 7.5 mg by mouth 3 (three) times daily.   . cholecalciferol (VITAMIN D3) 25 MCG (1000 UT) tablet Take  1,000 Units by mouth daily.  Marland Kitchen docusate sodium (COLACE) 100 MG capsule Take 100 mg by mouth 2 (two) times daily as needed for mild constipation.  . Ensure (ENSURE) Take 237 mLs by mouth 3 (three) times daily between meals.  . Melatonin 3 MG TABS Take 3 mg by mouth at bedtime.  . nitroGLYCERIN (NITROSTAT) 0.4 MG SL tablet Place 1 tablet under the tongue as directed. Place 1 tablet under the tongue every five minutes as needed for emergency chest pain  . NON FORMULARY Diet Type:  Regular  . pantoprazole (PROTONIX) 40 MG tablet Take 40 mg by mouth daily.  . polyethylene glycol (MIRALAX / GLYCOLAX) 17 g packet Take 17 g by mouth daily.  . QUEtiapine (SEROQUEL) 25 MG tablet Take 25 mg by mouth at bedtime.  Marland Kitchen QUEtiapine (SEROQUEL) 25 MG tablet Take 12.5 mg by mouth daily.  . rotigotine (NEUPRO) 4 MG/24HR Place 1 patch onto the skin daily. for Parkinson's. Remove old patch prior to applying new patch. Rotate site to dry hairless area between abd, thigh, hip, flank, shoulder, upper arm. Monitor for rash and notify MD of any. Do not apply to same site more than once every 14 days!  Marland Kitchen timolol (BETIMOL) 0.5 % ophthalmic solution Place 1 drop into both eyes 2 (two) times daily.  . [DISCONTINUED] docusate sodium (COLACE) 100 MG capsule Take 1 capsule (100 mg total) by mouth 2 (two) times daily.   No facility-administered encounter medications on file as of 05/17/2019.     SIGNIFICANT DIAGNOSTIC EXAMS   LABS REVIEWED PREVIOUS:    09-02-18: glucose 114; bun 26; creat 0.65; k+ 4.0; na++ 139; ca 8.3; liver normal albumin 3.7   01-16-19: wbc 10.0; hgb 14.7; hct 45.1; mcv 99.1 plt 275; glucose 104; bun 23; creat 0.82; k+ 4.2; na++ 139; ca 9.3; liver normal albumin 3.7   NO NEW LABS.   Review of Systems  Unable to perform ROS: Dementia (unable to participate )    Physical Exam Constitutional:      General: She is not in acute distress.    Appearance: She is cachectic. She is ill-appearing. She is not  diaphoretic.  Eyes:     Comments: History of cataract surgery      Neck:     Thyroid: No thyromegaly.  Cardiovascular:     Rate and Rhythm: Normal rate and regular rhythm.     Heart sounds: Normal heart sounds.  Pulmonary:     Effort: Pulmonary effort is normal. No respiratory distress.     Breath sounds: Normal breath sounds.  Abdominal:  General: Bowel sounds are normal. There is no distension.     Palpations: Abdomen is soft.     Tenderness: There is no abdominal tenderness.  Musculoskeletal:     Right lower leg: No edema.     Left lower leg: No edema.     Comments: Is able to move all extremities Uses wheelchair Has stiffness present; has bilateral tremor present    Lymphadenopathy:     Cervical: No cervical adenopathy.  Skin:    General: Skin is warm and dry.  Neurological:     Mental Status: She is alert. Mental status is at baseline.  Psychiatric:        Mood and Affect: Mood normal.       ASSESSMENT/ PLAN:  TODAY  1. parkinson's disease  2. Dementia due to parkinson's disease with behavioral disturbance 3. Psychosis in elderly with behavorial disturbance   Will continue current medications Will continue current plan of care Will continue to monitor her status.     MD is aware of resident's narcotic use and is in agreement with current plan of care. We will attempt to wean resident as appropriate.  Synthia Innocent NP [facility]  Contact [phone] Monday through Friday 8am- 5pm  After hours call [phone]

## 2019-05-18 DIAGNOSIS — G20 Parkinson's disease: Secondary | ICD-10-CM | POA: Diagnosis not present

## 2019-05-18 DIAGNOSIS — F22 Delusional disorders: Secondary | ICD-10-CM | POA: Diagnosis not present

## 2019-05-18 DIAGNOSIS — Z1159 Encounter for screening for other viral diseases: Secondary | ICD-10-CM | POA: Diagnosis not present

## 2019-05-18 DIAGNOSIS — G4723 Circadian rhythm sleep disorder, irregular sleep wake type: Secondary | ICD-10-CM | POA: Diagnosis not present

## 2019-05-24 DIAGNOSIS — F411 Generalized anxiety disorder: Secondary | ICD-10-CM | POA: Diagnosis not present

## 2019-05-26 DIAGNOSIS — Z1159 Encounter for screening for other viral diseases: Secondary | ICD-10-CM | POA: Diagnosis not present

## 2019-05-26 DIAGNOSIS — G20 Parkinson's disease: Secondary | ICD-10-CM | POA: Diagnosis not present

## 2019-05-31 ENCOUNTER — Non-Acute Institutional Stay (SKILLED_NURSING_FACILITY): Payer: Medicare Other | Admitting: Internal Medicine

## 2019-05-31 ENCOUNTER — Encounter: Payer: Self-pay | Admitting: Internal Medicine

## 2019-05-31 DIAGNOSIS — F0391 Unspecified dementia with behavioral disturbance: Secondary | ICD-10-CM | POA: Diagnosis not present

## 2019-05-31 DIAGNOSIS — I10 Essential (primary) hypertension: Secondary | ICD-10-CM

## 2019-05-31 DIAGNOSIS — F03918 Unspecified dementia, unspecified severity, with other behavioral disturbance: Secondary | ICD-10-CM

## 2019-05-31 DIAGNOSIS — H409 Unspecified glaucoma: Secondary | ICD-10-CM

## 2019-05-31 NOTE — Progress Notes (Signed)
Location:  [facility] Room Number: 105-D Place of Service:  SNF (31)  Hennie Duos, MD  Patient Care Team: Hennie Duos, MD as PCP - General (Internal Medicine) Center, [place] ([place]) Nyoka Cowden, Phylis Bougie, NP as Nurse Practitioner (Geriatric Medicine)  Extended Emergency Contact Information Primary Emergency Contact: Boultinghouse,[name] Address: [address]          [place], [place] [address] Johnnette Litter of [place] Phone: [phone] Relation: Spouse Secondary Emergency Contact: Kramar,[name] Address: [address]          [place], [place] [address] Johnnette Litter of [place] Phone: [phone] Mobile Phone: [phone] Relation: Son    Allergies: Hydrochlorothiazide, Lisinopril, Other, and Spironolactone  Chief Complaint  Patient presents with  . Medical Management of Chronic Issues    Routine [place] visit    HPI: Patient is an 86 y.o. female who is being seen for routine issues of psychosis NARA, coma, and hypertension.  Past Medical History:  Diagnosis Date  . Arthritis    osteoporosis  . Back pain   . Chronic obstructive pulmonary disease (COPD) ([place])   . COPD (chronic obstructive pulmonary disease) ([place])   . Dementia ([place])   . Gait instability   . Glaucoma   . Glaucoma   . Hypertension   . Neck pain   . Parkinson's disease ([place])   . Rectal bleeding 12/27/2017  . Tremor     Past Surgical History:  Procedure Laterality Date  . CATARACT EXTRACTION    . EYE SURGERY    . TONSILLECTOMY    . TUBAL LIGATION      Allergies as of 05/31/2019      Reactions   Hydrochlorothiazide Other (See Comments)   Reaction:  Dizziness    Lisinopril Cough   Other Other (See Comments)   Pt states that she is allergic to multiple BP meds -- does not recall the names.   Reaction:  Dizziness    Spironolactone Other (See Comments)   Reaction:  Blurred vision and sweating of feet       Medication  List    Notice   This visit is during an admission. Changes to the med list made in this visit will be reflected in the After Visit Summary of the admission.     No orders of the defined types were placed in this encounter.   Immunization History  Administered Date(s) Administered  . Influenza, High Dose Seasonal PF 01/29/2016  . Influenza-Unspecified 01/29/2011, 01/27/2012, 02/02/2013, 01/16/2014, 01/13/2018, 01/16/2019  . Moderna SARS-COVID-2 Vaccination 04/19/2019, 05/17/2019  . Pneumococcal Conjugate-13 02/20/2014, 01/19/2018  . Pneumococcal Polysaccharide-23 05/05/2012, 01/03/2019  . Pneumococcal-Unspecified 01/19/2018  . Tdap 02/20/2014, 01/19/2018    Social History   Tobacco Use  . Smoking status: Never Smoker  . Smokeless tobacco: Never Used  Substance Use Topics  . Alcohol use: No    Review of Systems unable to obtain secondary to dementia     Vitals:   05/31/19 0854  BP: (!) 114/57  Pulse: 70  Resp: 16  Temp: 97.7 F (36.5 C)  SpO2: 98%   Body mass index is 20.18 kg/m. Physical Exam  GENERAL APPEARANCE: Alert, No acute distress  SKIN: No diaphoresis rash HEENT: Unremarkable RESPIRATORY: Breathing is even, unlabored. Lung sounds are clear   CARDIOVASCULAR: Heart RRR no murmurs, rubs or gallops. No peripheral edema  GASTROINTESTINAL: Abdomen is soft, non-tender, not distended w/ normal bowel sounds.  GENITOURINARY: Bladder non tender, not  distended  MUSCULOSKELETAL: No abnormal joints or musculature NEUROLOGIC: Cranial nerves 2-12 grossly intact. Moves all extremities PSYCHIATRIC: Dementia, no behavioral issues  Patient Active Problem List   Diagnosis Date Noted  . Psychosis in elderly with behavioral disturbance (HCC) 12/17/2018  . GERD without esophagitis 06/16/2018  . Age-related osteoporosis without current pathological fracture 06/16/2018  . Chronic low back pain without sciatica 06/16/2018  . Bilateral chronic primary angle-closure  glaucoma, indeterminate stage 06/16/2018  . Chronic anxiety 06/16/2018  . Anxiety 11/25/2017  . Palliative care by specialist   . Medicare annual wellness visit, subsequent   . Chronic constipation   . Recurrent falls 04/26/2017  . Dementia (HCC) 04/26/2017  . COPD (chronic obstructive pulmonary disease) (HCC) 04/22/2017  . HTN (hypertension) 04/22/2017  . Parkinson's disease (HCC) 04/22/2017  . Glaucoma 04/22/2017  . Osteoporosis 04/22/2017  . Bilateral fracture of pubic rami (HCC) 04/22/2017  . Dizziness 10/13/2016    CMP     Component Value Date/Time   NA 139 01/16/2019 0800   K 4.2 01/16/2019 0800   CL 102 01/16/2019 0800   CO2 28 01/16/2019 0800   GLUCOSE 104 (H) 01/16/2019 0800   BUN 23 01/16/2019 0800   CREATININE 0.82 01/16/2019 0800   CALCIUM 9.3 01/16/2019 0800   PROT 8.7 (H) 01/16/2019 0800   ALBUMIN 3.7 01/16/2019 0800   AST 22 01/16/2019 0800   ALT 12 01/16/2019 0800   ALKPHOS 77 01/16/2019 0800   BILITOT 1.2 01/16/2019 0800   GFRNONAA >60 01/16/2019 0800   GFRAA >60 01/16/2019 0800   Recent Labs    09/02/18 0615 01/16/19 0800  NA 139 139  K 4.0 4.2  CL 100 102  CO2 29 28  GLUCOSE 114* 104*  BUN 26* 23  CREATININE 0.65 0.82  CALCIUM 9.3 9.3   Recent Labs    09/02/18 0615 01/16/19 0800  AST 24 22  ALT 14 12  ALKPHOS 63 77  BILITOT 0.9 1.2  PROT 8.3* 8.7*  ALBUMIN 3.7 3.7   Recent Labs    01/16/19 0800  WBC 10.0  HGB 14.7  HCT 45.1  MCV 99.1  PLT 275   No results for input(s): CHOL, LDLCALC, TRIG in the last 8760 hours.  Invalid input(s): HCL No results found for: MICROALBUR Lab Results  Component Value Date   TSH 1.419 06/16/2017   No results found for: HGBA1C No results found for: CHOL, HDL, LDLCALC, LDLDIRECT, TRIG, CHOLHDL  Significant Diagnostic Results in last 30 days:  No results found. Change this Assessment and Plan  Psychosis in elderly with behavioral disturbance (HCC) Appears controlled; continue Seroquel  12.5 mg every morning and 25 mg nightly  Glaucoma Stable; continue timolol 0.5% 1 drop each eye twice daily  HTN (hypertension) Controlled on no meds; continue to monitor     Margit Hanks, MD

## 2019-06-02 DIAGNOSIS — Z1159 Encounter for screening for other viral diseases: Secondary | ICD-10-CM | POA: Diagnosis not present

## 2019-06-02 DIAGNOSIS — G20 Parkinson's disease: Secondary | ICD-10-CM | POA: Diagnosis not present

## 2019-06-04 ENCOUNTER — Encounter: Payer: Self-pay | Admitting: Internal Medicine

## 2019-06-04 NOTE — Assessment & Plan Note (Signed)
Stable; continue timolol 0.5% 1 drop each eye twice daily

## 2019-06-04 NOTE — Assessment & Plan Note (Signed)
Appears controlled; continue Seroquel 12.5 mg every morning and 25 mg nightly

## 2019-06-04 NOTE — Assessment & Plan Note (Signed)
Controlled on no meds; continue to monitor 

## 2019-06-07 DIAGNOSIS — F411 Generalized anxiety disorder: Secondary | ICD-10-CM | POA: Diagnosis not present

## 2019-06-10 ENCOUNTER — Encounter (HOSPITAL_COMMUNITY)
Admission: RE | Admit: 2019-06-10 | Discharge: 2019-06-10 | Disposition: A | Discharge status: Home / Self Care | Payer: Medicare Other | Source: Skilled Nursing Facility | Attending: Internal Medicine | Admitting: Internal Medicine

## 2019-06-10 DIAGNOSIS — J449 Chronic obstructive pulmonary disease, unspecified: Secondary | ICD-10-CM | POA: Diagnosis not present

## 2019-06-10 DIAGNOSIS — I10 Essential (primary) hypertension: Secondary | ICD-10-CM | POA: Diagnosis not present

## 2019-06-10 LAB — COMPREHENSIVE METABOLIC PANEL
ALT: 12 U/L (ref 0–44)
AST: 21 U/L (ref 15–41)
Albumin: 2.8 g/dL — ABNORMAL LOW (ref 3.5–5.0)
Alkaline Phosphatase: 73 U/L (ref 38–126)
Anion gap: 8 (ref 5–15)
BUN: 42 mg/dL — ABNORMAL HIGH (ref 8–23)
CO2: 33 mmol/L — ABNORMAL HIGH (ref 22–32)
Calcium: 8.7 mg/dL — ABNORMAL LOW (ref 8.9–10.3)
Chloride: 110 mmol/L (ref 98–111)
Creatinine, Ser: 0.91 mg/dL (ref 0.44–1.00)
GFR calc Af Amer: >60 mL/min (ref >60)
GFR calc non Af Amer: 56 mL/min — ABNORMAL LOW (ref >60)
Glucose, Bld: 116 mg/dL — ABNORMAL HIGH (ref 70–99)
Potassium: 4.2 mmol/L (ref 3.5–5.1)
Sodium: 151 mmol/L — ABNORMAL HIGH (ref 135–145)
Total Bilirubin: 1.5 mg/dL — ABNORMAL HIGH (ref 0.3–1.2)
Total Protein: 8.1 g/dL (ref 6.5–8.1)

## 2019-06-10 LAB — CBC
HCT: 46.5 % — ABNORMAL HIGH (ref 36.0–46.0)
Hemoglobin: 14.4 g/dL (ref 12.0–15.0)
MCH: 30.7 pg (ref 26.0–34.0)
MCHC: 31 g/dL (ref 30.0–36.0)
MCV: 99.1 fL (ref 80.0–100.0)
Platelets: 299 10*3/uL (ref 150–400)
RBC: 4.69 MIL/uL (ref 3.87–5.11)
RDW: 14.6 % (ref 11.5–15.5)
WBC: 12.9 10*3/uL — ABNORMAL HIGH (ref 4.0–10.5)
nRBC: 0 % (ref 0.0–0.2)

## 2019-06-11 ENCOUNTER — Encounter (HOSPITAL_COMMUNITY)
Admission: RE | Admit: 2019-06-11 | Discharge: 2019-06-11 | Disposition: A | Discharge status: Home / Self Care | Payer: Medicare Other | Source: Skilled Nursing Facility | Attending: Adult Health | Admitting: Adult Health

## 2019-06-11 ENCOUNTER — Telehealth: Payer: Self-pay | Admitting: Internal Medicine

## 2019-06-11 DIAGNOSIS — I10 Essential (primary) hypertension: Secondary | ICD-10-CM | POA: Diagnosis not present

## 2019-06-11 DIAGNOSIS — J449 Chronic obstructive pulmonary disease, unspecified: Secondary | ICD-10-CM | POA: Diagnosis not present

## 2019-06-11 LAB — CBC WITH DIFFERENTIAL/PLATELET
Abs Immature Granulocytes: 0.04 10*3/uL (ref 0.00–0.07)
Basophils Absolute: 0.1 10*3/uL (ref 0.0–0.1)
Basophils Relative: 1 %
Eosinophils Absolute: 0 10*3/uL (ref 0.0–0.5)
Eosinophils Relative: 0 %
HCT: 42.5 % (ref 36.0–46.0)
Hemoglobin: 13.1 g/dL (ref 12.0–15.0)
Immature Granulocytes: 0 %
Lymphocytes Relative: 19 %
Lymphs Abs: 2.2 10*3/uL (ref 0.7–4.0)
MCH: 30.7 pg (ref 26.0–34.0)
MCHC: 30.8 g/dL (ref 30.0–36.0)
MCV: 99.5 fL (ref 80.0–100.0)
Monocytes Absolute: 1.5 10*3/uL — ABNORMAL HIGH (ref 0.1–1.0)
Monocytes Relative: 13 %
Neutro Abs: 7.5 10*3/uL (ref 1.7–7.7)
Neutrophils Relative %: 67 %
Platelets: 256 10*3/uL (ref 150–400)
RBC: 4.27 MIL/uL (ref 3.87–5.11)
RDW: 14.6 % (ref 11.5–15.5)
WBC: 11.3 10*3/uL — ABNORMAL HIGH (ref 4.0–10.5)
nRBC: 0 % (ref 0.0–0.2)

## 2019-06-11 LAB — BASIC METABOLIC PANEL
Anion gap: 7 (ref 5–15)
BUN: 40 mg/dL — ABNORMAL HIGH (ref 8–23)
CO2: 30 mmol/L (ref 22–32)
Calcium: 7.9 mg/dL — ABNORMAL LOW (ref 8.9–10.3)
Chloride: 112 mmol/L — ABNORMAL HIGH (ref 98–111)
Creatinine, Ser: 0.8 mg/dL (ref 0.44–1.00)
GFR calc Af Amer: >60 mL/min (ref >60)
GFR calc non Af Amer: >60 mL/min (ref >60)
Glucose, Bld: 123 mg/dL — ABNORMAL HIGH (ref 70–99)
Potassium: 3.7 mmol/L (ref 3.5–5.1)
Sodium: 149 mmol/L — ABNORMAL HIGH (ref 135–145)

## 2019-06-11 NOTE — Telephone Encounter (Signed)
[facility] Called saying patient more lethargic. Discontinued Melatonin, Seroquel and Buspar. Ordered Labs. Her Sodium was elevated. Per Nurses no fever or any other signs of infection.White count mildly elevated but no Dysuria,cough or SOB. Generalize decline with poor Appetite. Told to d/w the family who said they will try IV fluids for now. Recheck Labs. If not better consider Comfort care so they can come and visit her. Ordered D5NS 50cc/hour for 1 litre. Repeat BMP. Will call with results.

## 2019-06-12 ENCOUNTER — Encounter: Payer: Self-pay | Admitting: Adult Health

## 2019-06-12 ENCOUNTER — Encounter (HOSPITAL_COMMUNITY)
Admission: RE | Admit: 2019-06-12 | Discharge: 2019-06-12 | Disposition: A | Discharge status: Home / Self Care | Payer: Medicare Other | Source: Skilled Nursing Facility | Attending: Internal Medicine | Admitting: Internal Medicine

## 2019-06-12 ENCOUNTER — Non-Acute Institutional Stay (SKILLED_NURSING_FACILITY): Payer: Medicare Other | Admitting: Adult Health

## 2019-06-12 DIAGNOSIS — F0281 Dementia in other diseases classified elsewhere with behavioral disturbance: Secondary | ICD-10-CM | POA: Diagnosis not present

## 2019-06-12 DIAGNOSIS — R627 Adult failure to thrive: Secondary | ICD-10-CM

## 2019-06-12 DIAGNOSIS — I10 Essential (primary) hypertension: Secondary | ICD-10-CM | POA: Diagnosis not present

## 2019-06-12 DIAGNOSIS — G20 Parkinson's disease: Secondary | ICD-10-CM

## 2019-06-12 DIAGNOSIS — J449 Chronic obstructive pulmonary disease, unspecified: Secondary | ICD-10-CM | POA: Insufficient documentation

## 2019-06-12 LAB — CBC WITH DIFFERENTIAL/PLATELET
Abs Immature Granulocytes: 0.06 10*3/uL (ref 0.00–0.07)
Basophils Absolute: 0.1 10*3/uL (ref 0.0–0.1)
Basophils Relative: 1 %
Eosinophils Absolute: 0.1 10*3/uL (ref 0.0–0.5)
Eosinophils Relative: 1 %
HCT: 42.6 % (ref 36.0–46.0)
Hemoglobin: 12.9 g/dL (ref 12.0–15.0)
Immature Granulocytes: 0 %
Lymphocytes Relative: 15 %
Lymphs Abs: 2.1 10*3/uL (ref 0.7–4.0)
MCH: 30.8 pg (ref 26.0–34.0)
MCHC: 30.3 g/dL (ref 30.0–36.0)
MCV: 101.7 fL — ABNORMAL HIGH (ref 80.0–100.0)
Monocytes Absolute: 1.7 10*3/uL — ABNORMAL HIGH (ref 0.1–1.0)
Monocytes Relative: 12 %
Neutro Abs: 10.2 10*3/uL — ABNORMAL HIGH (ref 1.7–7.7)
Neutrophils Relative %: 71 %
Platelets: 265 10*3/uL (ref 150–400)
RBC: 4.19 MIL/uL (ref 3.87–5.11)
RDW: 14.5 % (ref 11.5–15.5)
WBC: 14.2 10*3/uL — ABNORMAL HIGH (ref 4.0–10.5)
nRBC: 0 % (ref 0.0–0.2)

## 2019-06-12 LAB — BASIC METABOLIC PANEL
Anion gap: 9 (ref 5–15)
BUN: 40 mg/dL — ABNORMAL HIGH (ref 8–23)
CO2: 31 mmol/L (ref 22–32)
Calcium: 8.1 mg/dL — ABNORMAL LOW (ref 8.9–10.3)
Chloride: 111 mmol/L (ref 98–111)
Creatinine, Ser: 0.87 mg/dL (ref 0.44–1.00)
GFR calc Af Amer: >60 mL/min (ref >60)
GFR calc non Af Amer: 59 mL/min — ABNORMAL LOW (ref >60)
Glucose, Bld: 136 mg/dL — ABNORMAL HIGH (ref 70–99)
Potassium: 3.5 mmol/L (ref 3.5–5.1)
Sodium: 151 mmol/L — ABNORMAL HIGH (ref 135–145)

## 2019-06-12 NOTE — Progress Notes (Signed)
.  Location:    [facility] Room Number: 105/D Place of Service:  SNF (31)   CODE STATUS: DNR  Allergies  Allergen Reactions  . Hydrochlorothiazide Other (See Comments)    Reaction:  Dizziness   . Lisinopril Cough  . Other Other (See Comments)    Pt states that she is allergic to multiple BP meds -- does not recall the names.   Reaction:  Dizziness   . Spironolactone Other (See Comments)    Reaction:  Blurred vision and sweating of feet     Chief Complaint  Patient presents with  . Acute Visit    Change in status    HPI:  She is declining. She is not eating or drinking. No reports of fevers. She does not appear to be comfortable. She did receive IVF over the weekend which have not helped to improve upon her condition. I have spoken with her son; who would like for her to be made comfort care. We have discussed stopping all of her medications at this time.   Past Medical History:  Diagnosis Date  . Arthritis    osteoporosis  . Back pain   . Chronic obstructive pulmonary disease (COPD) (HCC)   . COPD (chronic obstructive pulmonary disease) (HCC)   . Dementia (HCC)   . Gait instability   . Glaucoma   . Glaucoma   . Hypertension   . Neck pain   . Parkinson's disease (HCC)   . Rectal bleeding 12/27/2017  . Tremor     Past Surgical History:  Procedure Laterality Date  . CATARACT EXTRACTION    . EYE SURGERY    . TONSILLECTOMY    . TUBAL LIGATION      Social History   Socioeconomic History  . Marital status: Married    Spouse name: Normand Sloop  . Number of children: 5  . Years of education: 3  . Highest education level: Not on file  Occupational History  . Occupation: retired   Tobacco Use  . Smoking status: Never Smoker  . Smokeless tobacco: Never Used  Substance and Sexual Activity  . Alcohol use: No  . Drug use: No  . Sexual activity: Not Currently  Other Topics Concern  . Not on file  Social History Narrative   Long term  resident of [facility]    Social Determinants of Health   Financial Resource Strain: Low Risk   . Difficulty of Paying Living Expenses: Not hard at all  Food Insecurity: No Food Insecurity  . Worried About Programme researcher, broadcasting/film/video in the Last Year: Never true  . Ran Out of Food in the Last Year: Never true  Transportation Needs: No Transportation Needs  . Lack of Transportation (Medical): No  . Lack of Transportation (Non-Medical): No  Physical Activity: Inactive  . Days of Exercise per Week: 0 days  . Minutes of Exercise per Session: 0 min  Stress: Unknown  . Feeling of Stress : Patient refused  Social Connections: Unknown  . Frequency of Communication with Friends and Family: Patient refused  . Frequency of Social Gatherings with Friends and Family: Patient refused  . Attends Religious Services: Patient refused  . Active Member of Clubs or Organizations: Patient refused  . Attends Banker Meetings: Patient refused  . Marital Status: Patient refused  Intimate Partner Violence: Unknown  . Fear of Current or Ex-Partner: Patient refused  . Emotionally Abused: Patient refused  . Physically Abused: Patient refused  . Sexually  Abused: Patient refused   Family History  Problem Relation Age of Onset  . Heart failure Mother       VITAL SIGNS BP 137/68   Pulse 77   Temp 98.4 F (36.9 C) (Oral)   Resp 18   Ht 4\' 8"  (1.422 m)   Wt 90 lb (40.8 kg)   SpO2 98%   BMI 20.18 kg/m   Outpatient Encounter Medications as of 06/12/2019  Medication Sig  . acetaminophen (TYLENOL) 325 MG tablet Take 650 mg by mouth every 6 (six) hours as needed.  Marland Kitchen aspirin EC 81 MG tablet Take 81 mg by mouth daily.  . bisacodyl (DULCOLAX) 10 MG suppository Place 1 suppository (10 mg total) rectally as needed for moderate constipation.  . busPIRone (BUSPAR) 7.5 MG tablet Take 7.5 mg by mouth 3 (three) times daily.   . cholecalciferol (VITAMIN D3) 25 MCG (1000 UT) tablet Take 1,000 Units by mouth daily.    Marland Kitchen docusate sodium (COLACE) 100 MG capsule Take 100 mg by mouth 2 (two) times daily as needed for mild constipation.  . Ensure (ENSURE) Take 237 mLs by mouth 3 (three) times daily between meals.  . Melatonin 3 MG TABS Take 3 mg by mouth at bedtime.  . nitroGLYCERIN (NITROSTAT) 0.4 MG SL tablet Place 1 tablet under the tongue as directed. Place 1 tablet under the tongue every five minutes as needed for emergency chest pain  . NON FORMULARY Diet Type:  Regular  . pantoprazole (PROTONIX) 40 MG tablet Take 40 mg by mouth daily.  . polyethylene glycol (MIRALAX / GLYCOLAX) 17 g packet Take 17 g by mouth daily.  . QUEtiapine (SEROQUEL) 25 MG tablet Take 25 mg by mouth at bedtime.  Marland Kitchen QUEtiapine (SEROQUEL) 25 MG tablet Take 12.5 mg by mouth daily.  . rotigotine (NEUPRO) 4 MG/24HR Place 1 patch onto the skin daily. for Parkinson's. Remove old patch prior to applying new patch. Rotate site to dry hairless area between abd, thigh, hip, flank, shoulder, upper arm. Monitor for rash and notify MD of any. Do not apply to same site more than once every 14 days!  Marland Kitchen timolol (BETIMOL) 0.5 % ophthalmic solution Place 1 drop into both eyes 2 (two) times daily.   No facility-administered encounter medications on file as of 06/12/2019.     SIGNIFICANT DIAGNOSTIC EXAMS    LABS REVIEWED PREVIOUS:    09-02-18: glucose 114; bun 26; creat 0.65; k+ 4.0; na++ 139; ca 8.3; liver normal albumin 3.7   01-16-19: wbc 10.0; hgb 14.7; hct 45.1; mcv 99.1 plt 275; glucose 104; bun 23; creat 0.82; k+ 4.2; na++ 139; ca 9.3; liver normal albumin 3.7   NO NEW LABS.   Review of Systems  Unable to perform ROS: Dementia (unable to participate )   Physical Exam Constitutional:      General: She is not in acute distress.    Appearance: She is cachectic. She is not diaphoretic.  Neck:     Thyroid: No thyromegaly.  Cardiovascular:     Rate and Rhythm: Normal rate and regular rhythm.     Pulses: Normal pulses.     Heart sounds:  Normal heart sounds.  Pulmonary:     Effort: Pulmonary effort is normal. No respiratory distress.     Breath sounds: Normal breath sounds.  Abdominal:     General: Bowel sounds are normal. There is no distension.     Palpations: Abdomen is soft.     Tenderness: There is no abdominal tenderness.  Musculoskeletal:     Cervical back: Neck supple.     Right lower leg: No edema.     Left lower leg: No edema.  Lymphadenopathy:     Cervical: No cervical adenopathy.  Skin:    General: Skin is warm and dry.     Coloration: Skin is pale.  Neurological:     Comments: Is not aware        ASSESSMENT/ PLAN:  TODAY  1. parkinson's dementia 2. Failure to thrive in adult  Will stop all medications Will begin tylenol 605 mg PR eveery 6 hours The focus is on comfort only   MD is aware of resident's narcotic use and is in agreement with current plan of care. We will attempt to wean resident as appropriate.  Synthia Innocent NP [facility]  Contact [phone] Monday through Friday 8am- 5pm  After hours call [phone]

## 2019-06-13 ENCOUNTER — Other Ambulatory Visit: Payer: Self-pay | Admitting: Adult Health

## 2019-06-13 MED ORDER — MORPHINE SULFATE (CONCENTRATE) 20 MG/ML PO SOLN: 5.0000 mg | ORAL | 0 refills | Status: AC | PRN

## 2019-06-14 DIAGNOSIS — G20 Parkinson's disease: Secondary | ICD-10-CM | POA: Diagnosis not present

## 2019-06-14 DIAGNOSIS — F411 Generalized anxiety disorder: Secondary | ICD-10-CM | POA: Diagnosis not present

## 2019-06-14 DIAGNOSIS — Z1159 Encounter for screening for other viral diseases: Secondary | ICD-10-CM | POA: Diagnosis not present

## 2019-06-16 DIAGNOSIS — R627 Adult failure to thrive: Secondary | ICD-10-CM | POA: Insufficient documentation

## 2019-07-13 DEATH — deceased
